# Patient Record
Sex: Female | Born: 1960
Health system: Southern US, Community
[De-identification: ages and names within clinical notes are randomized; demographics above are authoritative.]

## PROBLEM LIST (undated history)

## (undated) DIAGNOSIS — I10 Essential (primary) hypertension: Secondary | ICD-10-CM

## (undated) DIAGNOSIS — M14679 Charcot's joint, unspecified ankle and foot: Secondary | ICD-10-CM

## (undated) DIAGNOSIS — E11319 Type 2 diabetes mellitus with unspecified diabetic retinopathy without macular edema: Secondary | ICD-10-CM

## (undated) DIAGNOSIS — I639 Cerebral infarction, unspecified: Secondary | ICD-10-CM

## (undated) DIAGNOSIS — I2510 Atherosclerotic heart disease of native coronary artery without angina pectoris: Secondary | ICD-10-CM

## (undated) DIAGNOSIS — E1140 Type 2 diabetes mellitus with diabetic neuropathy, unspecified: Secondary | ICD-10-CM

## (undated) DIAGNOSIS — G4730 Sleep apnea, unspecified: Secondary | ICD-10-CM

---

## 2010-04-03 ENCOUNTER — Ambulatory Visit: Payer: Self-pay | Admitting: Internal Medicine

## 2015-02-07 ENCOUNTER — Other Ambulatory Visit: Payer: Self-pay | Admitting: Surgery

## 2015-02-07 ENCOUNTER — Ambulatory Visit
Admission: RE | Admit: 2015-02-07 | Discharge: 2015-02-07 | Disposition: A | Discharge status: Home / Self Care | Payer: BLUE CROSS/BLUE SHIELD | Source: Ambulatory Visit | Attending: Surgery | Admitting: Surgery

## 2015-02-07 ENCOUNTER — Encounter: Payer: BLUE CROSS/BLUE SHIELD | Attending: Surgery | Admitting: Surgery

## 2015-02-07 DIAGNOSIS — M19071 Primary osteoarthritis, right ankle and foot: Secondary | ICD-10-CM | POA: Insufficient documentation

## 2015-02-07 DIAGNOSIS — S81801A Unspecified open wound, right lower leg, initial encounter: Secondary | ICD-10-CM

## 2015-02-07 DIAGNOSIS — I10 Essential (primary) hypertension: Secondary | ICD-10-CM | POA: Insufficient documentation

## 2015-02-07 DIAGNOSIS — E11621 Type 2 diabetes mellitus with foot ulcer: Secondary | ICD-10-CM | POA: Diagnosis not present

## 2015-02-07 DIAGNOSIS — Z794 Long term (current) use of insulin: Secondary | ICD-10-CM | POA: Insufficient documentation

## 2015-02-07 DIAGNOSIS — L97412 Non-pressure chronic ulcer of right heel and midfoot with fat layer exposed: Secondary | ICD-10-CM | POA: Insufficient documentation

## 2015-02-07 DIAGNOSIS — M8580 Other specified disorders of bone density and structure, unspecified site: Secondary | ICD-10-CM | POA: Insufficient documentation

## 2015-02-07 DIAGNOSIS — X58XXXA Exposure to other specified factors, initial encounter: Secondary | ICD-10-CM | POA: Insufficient documentation

## 2015-02-07 DIAGNOSIS — M14671 Charcot's joint, right ankle and foot: Secondary | ICD-10-CM | POA: Insufficient documentation

## 2015-02-07 DIAGNOSIS — G629 Polyneuropathy, unspecified: Secondary | ICD-10-CM | POA: Insufficient documentation

## 2015-02-08 NOTE — Progress Notes (Signed)
Julia Simmons, Julia Simmons ([id number]) Visit Report for 02/07/2015 Allergy List Details Patient Name: Julia Simmons, Julia Simmons. Date of Service: 02/07/2015 10:15 AM Medical Record Number: [id number] Patient Account Number: [id number] Date of Birth/Sex: November 27, 1960 (54 y.o. Female) Treating RN: Montey Hora Primary Care Physician: Glendon Axe Other Clinician: Referring Physician: Treating Physician/Extender: Frann Rider in Treatment: 0 Allergies Active Allergies hydrochlorothiazide Sulfa (Sulfonamide Antibiotics) Allergy Notes Electronic Signature(s) Signed: 02/07/2015 5:27:36 PM By: Montey Hora Entered By: Montey Hora on 02/07/2015 10:19:22 Julia Simmons ([id number]) -------------------------------------------------------------------------------- Arrival Information Details Patient Name: Julia Simmons. Date of Service: 02/07/2015 10:15 AM Medical Record Number: [id number] Patient Account Number: [id number] Date of Birth/Sex: 1961-03-28 (54 y.o. Female) Treating RN: Montey Hora Primary Care Physician: Glendon Axe Other Clinician: Referring Physician: Treating Physician/Extender: Frann Rider in Treatment: 0 Visit Information Patient Arrived: Ambulatory Arrival Time: 10:18 Accompanied By: spouse Transfer Assistance: None Patient Identification Verified: Yes Secondary Verification Process Yes Completed: Patient Has Alerts: Yes Patient Alerts: DMII Electronic Signature(s) Signed: 02/07/2015 5:27:36 PM By: Montey Hora Entered By: Montey Hora on 02/07/2015 10:18:37 Julia Simmons ([id number]) -------------------------------------------------------------------------------- Clinic Level of Care Assessment Details Patient Name: Julia Simmons. Date of Service: 02/07/2015 10:15 AM Medical Record Number: [id number] Patient Account Number: [id number] Date of Birth/Sex: 12-23-1960 (54 y.o. Female) Treating RN: Montey Hora Primary Care Physician: Glendon Axe Other Clinician: Referring Physician: Treating Physician/Extender: Frann Rider in Treatment: 0 Clinic Level of Care Assessment Items TOOL 1 Quantity Score []  - Use when EandM and Procedure is performed on INITIAL visit 0 ASSESSMENTS - Nursing Assessment / Reassessment X - General Physical Exam (combine w/ comprehensive assessment (listed just 1 20 below) when performed on new pt. evals) X - Comprehensive Assessment (HX, ROS, Risk Assessments, Wounds Hx, etc.) 1 25 ASSESSMENTS - Wound and Skin Assessment / Reassessment []  - Dermatologic / Skin Assessment (not related to wound area) 0 ASSESSMENTS - Ostomy and/or Continence Assessment and Care []  - Incontinence Assessment and Management 0 []  - Ostomy Care Assessment and Management (repouching, etc.) 0 PROCESS - Coordination of Care X - Simple Patient / Family Education for ongoing care 1 15 []  - Complex (extensive) Patient / Family Education for ongoing care 0 X - Staff obtains Programmer, systems, Records, Test Results / Process Orders 1 10 []  - Staff telephones HHA, Nursing Homes / Clarify orders / etc 0 []  - Routine Transfer to another Facility (non-emergent condition) 0 []  - Routine Hospital Admission (non-emergent condition) 0 X - New Admissions / Biomedical engineer / Ordering NPWT, Apligraf, etc. 1 15 []  - Emergency Hospital Admission (emergent condition) 0 PROCESS - Special Needs []  - Pediatric / Minor Patient Management 0 []  - Isolation Patient Management 0 Julia Simmons, BREKKE. ([id number]) []  - Hearing / Language / Visual special needs 0 []  - Assessment of Community assistance (transportation, Simmons/C planning, etc.) 0 []  - Additional assistance / Altered mentation 0 []  - Support Surface(s) Assessment (bed, cushion, seat, etc.) 0 INTERVENTIONS - Miscellaneous []  - External ear exam 0 []  - Patient Transfer (multiple staff / Civil Service fast streamer / Similar devices) 0 []  - Simple Staple / Suture removal (25 or less) 0 []  - Complex  Staple / Suture removal (26 or more) 0 []  - Hypo/Hyperglycemic Management (do not check if billed separately) 0 X - Ankle / Brachial Index (ABI) - do not check if billed separately 1 15 Has the patient been seen at the hospital within the last three years: Yes Total Score: 100 Level Of Care: New/Established -  Level 3 Electronic Signature(s) Signed: 02/07/2015 5:27:36 PM By: Montey Hora Entered By: Montey Hora on 02/07/2015 11:03:30 Julia Simmons ([id number]) -------------------------------------------------------------------------------- Encounter Discharge Information Details Patient Name: Julia Simmons. Date of Service: 02/07/2015 10:15 AM Medical Record Number: [id number] Patient Account Number: [id number] Date of Birth/Sex: 12/31/1960 (54 y.o. Female) Treating RN: Montey Hora Primary Care Physician: Glendon Axe Other Clinician: Referring Physician: Treating Physician/Extender: Frann Rider in Treatment: 0 Encounter Discharge Information Items Discharge Pain Level: 0 Discharge Condition: Stable Ambulatory Status: Ambulatory Discharge Destination: Home Transportation: Private Auto Accompanied By: spouse Schedule Follow-up Appointment: Yes Medication Reconciliation completed and provided to Patient/Care No Jalana Moore: Provided on Clinical Summary of Care: 02/07/2015 Form Type Recipient Paper Patient [facility] Electronic Signature(s) Signed: 02/07/2015 11:28:32 AM By: Ruthine Dose Entered By: Ruthine Dose on 02/07/2015 11:28:32 Julia Simmons ([id number]) -------------------------------------------------------------------------------- Lower Extremity Assessment Details Patient Name: Julia Simmons. Date of Service: 02/07/2015 10:15 AM Medical Record Number: [id number] Patient Account Number: [id number] Date of Birth/Sex: 12/20/1960 (54 y.o. Female) Treating RN: Montey Hora Primary Care Physician: Glendon Axe Other Clinician: Referring  Physician: Treating Physician/Extender: Frann Rider in Treatment: 0 Edema Assessment Assessed: [Left: No] [Right: No] Edema: [Left: Yes] [Right: Yes] Calf Left: Right: Point of Measurement: 33 cm From Medial Instep 57.2 cm 45 cm Ankle Left: Right: Point of Measurement: 10 cm From Medial Instep 29.6 cm 25.2 cm Vascular Assessment Claudication: Claudication Assessment [Left:None] [Right:None] Pulses: Posterior Tibial Palpable: [Left:No] [Right:No] Dorsalis Pedis Palpable: [Left:Yes] [Right:Yes] Extremity colors, hair growth, and conditions: Extremity Color: [Left:Normal] [Right:Normal] Hair Growth on Extremity: [Left:No] [Right:No] Temperature of Extremity: [Left:Warm] [Right:Warm] Capillary Refill: [Left:< 3 seconds] [Right:< 3 seconds] Blood Pressure: Brachial: [Left:146] [Right:144] Dorsalis Pedis: [Left:Dorsalis Pedis: 172] Ankle: Posterior Tibial: 184 [Left:Posterior Tibial: 188 1.26] [Right:1.29] Toe Nail Assessment Left: Right: Thick: No No Discolored: No No Deformed: No No Improper Length and Hygiene: No No Julia Simmons, Julia Simmons ([id number]) Electronic Signature(s) Signed: 02/07/2015 5:27:36 PM By: Montey Hora Entered By: Montey Hora on 02/07/2015 10:55:00 Julia Simmons ([id number]) -------------------------------------------------------------------------------- Multi Wound Chart Details Patient Name: Julia Simmons. Date of Service: 02/07/2015 10:15 AM Medical Record Number: [id number] Patient Account Number: [id number] Date of Birth/Sex: 05-Feb-1961 (54 y.o. Female) Treating RN: Montey Hora Primary Care Physician: Glendon Axe Other Clinician: Referring Physician: Treating Physician/Extender: Frann Rider in Treatment: 0 Vital Signs Height(in): 65 Pulse(bpm): 70 Weight(lbs): 336 Blood Pressure 146/65 (mmHg): Body Mass Index(BMI): 56 Temperature(F): 97.9 Respiratory Rate 18 (breaths/min): Photos: [1:No Photos]  [N/A:N/A] Wound Location: [1:Right Foot - Plantar] [N/A:N/A] Wounding Event: [1:Gradually Appeared] [N/A:N/A] Primary Etiology: [1:Diabetic Wound/Ulcer of the Lower Extremity] [N/A:N/A] Comorbid History: [1:Hypertension, Type II Diabetes, Neuropathy] [N/A:N/A] Date Acquired: [1:11/17/2014] [N/A:N/A] Weeks of Treatment: [1:0] [N/A:N/A] Wound Status: [1:Open] [N/A:N/A] Measurements L x W x Simmons 1.4x1.8x0.7 [N/A:N/A] (cm) Area (cm) : [1:1.979] [N/A:N/A] Volume (cm) : [1:1.385] [N/A:N/A] % Reduction in Area: [1:0.00%] [N/A:N/A] % Reduction in Volume: 0.00% [N/A:N/A] Classification: [1:Grade 1] [N/A:N/A] Exudate Amount: [1:Medium] [N/A:N/A] Exudate Type: [1:Serous] [N/A:N/A] Exudate Color: [1:amber] [N/A:N/A] Wound Margin: [1:Flat and Intact] [N/A:N/A] Granulation Amount: [1:Large (67-100%)] [N/A:N/A] Granulation Quality: [1:Red] [N/A:N/A] Necrotic Amount: [1:None Present (0%)] [N/A:N/A] Exposed Structures: [1:Fascia: No Fat: No Tendon: No Muscle: No Joint: No Bone: No] [N/A:N/A] Limited to Skin Breakdown Epithelialization: None N/A N/A Periwound Skin Texture: Callus: Yes N/A N/A Edema: No Excoriation: No Induration: No Crepitus: No Fluctuance: No Friable: No Rash: No Scarring: No Periwound Skin Moist: Yes N/A N/A Moisture: Maceration: No Dry/Scaly: No Periwound Skin Color: Atrophie Blanche: No  N/A N/A Cyanosis: No Ecchymosis: No Erythema: No Hemosiderin Staining: No Mottled: No Pallor: No Rubor: No Temperature: No Abnormality N/A N/A Tenderness on No N/A N/A Palpation: Wound Preparation: Ulcer Cleansing: N/A N/A Rinsed/Irrigated with Saline Topical Anesthetic Applied: Other: lidocaine 4% Treatment Notes Electronic Signature(s) Signed: 02/07/2015 5:27:36 PM By: Montey Hora Entered By: Montey Hora on 02/07/2015 11:03:03 Julia Simmons ([id number]) -------------------------------------------------------------------------------- [place] Details Patient Name: Julia Simmons. Date of Service: 02/07/2015 10:15 AM Medical Record Number: [id number] Patient Account Number: [id number] Date of Birth/Sex: 31-Aug-1960 (54 y.o. Female) Treating RN: Montey Hora Primary Care Physician: Glendon Axe Other Clinician: Referring Physician: Treating Physician/Extender: Frann Rider in Treatment: 0 Active Inactive Abuse / Safety / Falls / Self Care Management Nursing Diagnoses: Impaired physical mobility Potential for falls Goals: Patient will remain injury free Date Initiated: 02/07/2015 Goal Status: Active Interventions: Assess fall risk on admission and as needed Notes: Orientation to the Wound Care Program Nursing Diagnoses: Knowledge deficit related to the wound healing center program Goals: Patient/caregiver will verbalize understanding of the [place] Program Date Initiated: 02/07/2015 Goal Status: Active Interventions: Provide education on orientation to the wound center Notes: Peripheral Neuropathy Nursing Diagnoses: Potential alteration in peripheral tissue perfusion (select prior to confirmation of diagnosis) Goals: Patient/caregiver will verbalize understanding of disease process and disease management Julia Simmons, Julia Simmons ([id number]) Date Initiated: 02/07/2015 Goal Status: Active Interventions: Assess signs and symptoms of neuropathy upon admission and as needed Notes: Wound/Skin Impairment Nursing Diagnoses: Impaired tissue integrity Goals: Ulcer/skin breakdown will have a volume reduction of 30% by week 4 Date Initiated: 02/07/2015 Goal Status: Active Ulcer/skin breakdown will have a volume reduction of 50% by week 8 Date Initiated: 02/07/2015 Goal Status: Active Ulcer/skin breakdown will have a volume reduction of 80% by week 12 Date Initiated: 02/07/2015 Goal Status: Active Ulcer/skin breakdown will heal within 14 weeks Date Initiated: 02/07/2015 Goal Status:  Active Interventions: Assess patient/caregiver ability to perform ulcer/skin care regimen upon admission and as needed Assess ulceration(s) every visit Notes: Electronic Signature(s) Signed: 02/07/2015 5:27:36 PM By: Montey Hora Entered By: Montey Hora on 02/07/2015 11:01:40 Julia Simmons ([id number]) -------------------------------------------------------------------------------- Patient/Caregiver Education Details Patient Name: Julia Simmons. Date of Service: 02/07/2015 10:15 AM Medical Record Number: [id number] Patient Account Number: [id number] Date of Birth/Gender: Jan 15, 1961 (54 y.o. Female) Treating RN: Montey Hora Primary Care Physician: Glendon Axe Other Clinician: Referring Physician: Treating Physician/Extender: Frann Rider in Treatment: 0 Education Assessment Education Provided To: Patient and Caregiver Education Topics Provided Wound/Skin Impairment: Handouts: Other: wound care as ordered Methods: Demonstration, Explain/Verbal Responses: State content correctly Electronic Signature(s) Signed: 02/07/2015 5:27:36 PM By: Montey Hora Entered By: Montey Hora on 02/07/2015 11:04:30 Julia Simmons ([id number]) -------------------------------------------------------------------------------- Wound Assessment Details Patient Name: Julia Simmons. Date of Service: 02/07/2015 10:15 AM Medical Record Number: [id number] Patient Account Number: [id number] Date of Birth/Sex: [birth year] (54 y.o. Female) Treating RN: Montey Hora Primary Care Physician: Glendon Axe Other Clinician: Referring Physician: Treating Physician/Extender: Frann Rider in Treatment: 0 Wound Status Wound Number: 1 Primary Diabetic Wound/Ulcer of the Lower Etiology: Extremity Wound Location: Right Foot - Plantar Wound Status: Open Wounding Event: Gradually Appeared Comorbid Hypertension, Type II Diabetes, Date Acquired: 10/17/2014 History: Neuropathy Weeks Of  Treatment: 0 Clustered Wound: No Photos Wound Measurements Length: (cm) 1.4 Width: (cm) 1.8 Depth: (cm) 0.7 Area: (cm) 1.979 Volume: (cm) 1.385 % Reduction in Area: 0% % Reduction in Volume: 0% Epithelialization: None Tunneling: No Undermining: No Wound Description Classification: Grade 1 Wound  Margin: Flat and Intact Exudate Amount: Medium Exudate Type: Serous Exudate Color: amber Foul Odor After Cleansing: No Wound Bed Granulation Amount: Large (67-100%) Exposed Structure Granulation Quality: Red Fascia Exposed: No Necrotic Amount: None Present (0%) Fat Layer Exposed: No Tendon Exposed: No Muscle Exposed: No Julia Simmons, Julia Simmons. ([id number]) Joint Exposed: No Bone Exposed: No Limited to Skin Breakdown Periwound Skin Texture Texture Color No Abnormalities Noted: No No Abnormalities Noted: No Callus: Yes Atrophie Blanche: No Crepitus: No Cyanosis: No Excoriation: No Ecchymosis: No Fluctuance: No Erythema: No Friable: No Hemosiderin Staining: No Induration: No Mottled: No Localized Edema: No Pallor: No Rash: No Rubor: No Scarring: No Temperature / Pain Moisture Temperature: No Abnormality No Abnormalities Noted: No Dry / Scaly: No Maceration: No Moist: Yes Wound Preparation Ulcer Cleansing: Rinsed/Irrigated with Saline Topical Anesthetic Applied: Other: lidocaine 4%, Treatment Notes Wound #1 (Right, Plantar Foot) 1. Cleansed with: Clean wound with Normal Saline 2. Anesthetic Topical Lidocaine 4% cream to wound bed prior to debridement 4. Dressing Applied: Aquacel Ag Other dressing (specify in notes) 5. Secondary Dressing Applied Dry Gauze 7. Secured with Tape Notes felt Electronic Signature(s) Signed: 02/07/2015 12:27:11 PM By: Montey Hora Entered By: Montey Hora on 02/07/2015 12:27:11 Julia Simmons, Julia Simmons ([id number]) Geanie Cooley, Ander Gaster  ([id number]) -------------------------------------------------------------------------------- Vitals Details Patient Name: Julia Simmons. Date of Service: 02/07/2015 10:15 AM Medical Record Number: [id number] Patient Account Number: [id number] Date of Birth/Sex: August 06, 1960 (54 y.o. Female) Treating RN: Montey Hora Primary Care Physician: Glendon Axe Other Clinician: Referring Physician: Treating Physician/Extender: Frann Rider in Treatment: 0 Vital Signs Time Taken: 10:26 Temperature (F): 97.9 Height (in): 65 Pulse (bpm): 70 Source: Stated Respiratory Rate (breaths/min): 18 Weight (lbs): 336 Blood Pressure (mmHg): 146/65 Source: Measured Reference Range: 80 - 120 mg / dl Body Mass Index (BMI): 55.9 Electronic Signature(s) Signed: 02/07/2015 5:27:36 PM By: Montey Hora Entered By: Montey Hora on 02/07/2015 10:29:42

## 2015-02-08 NOTE — Progress Notes (Addendum)
DAMIEN, CISAR ([id number]) Visit Report for 02/07/2015 Chief Complaint Document Details Patient Name: Julia Simmons, Julia Simmons. Date of Service: 02/07/2015 10:15 AM Medical Record Number: [id number] Patient Account Number: [id number] Date of Birth/Sex: 02/10/1961 (54 y.o. Female) Treating RN: Montey Hora Primary Care Physician: Glendon Axe Other Clinician: Referring Physician: Treating Physician/Extender: Frann Rider in Treatment: 0 Information Obtained from: Patient Chief Complaint Patients presents for treatment of an open diabetic ulcer. 54 year old patient whose had a ulcerated area on her right midfoot for about 3 months. Electronic Signature(s) Signed: 02/07/2015 11:40:33 AM By: Christin Fudge MD, FACS Entered By: Christin Fudge on 02/07/2015 11:40:33 Vernon Prey ([id number]) -------------------------------------------------------------------------------- Debridement Details Patient Name: Julia Shown D. Date of Service: 02/07/2015 10:15 AM Medical Record Number: [id number] Patient Account Number: [id number] Date of Birth/Sex: [birth year] (54 y.o. Female) Treating RN: Montey Hora Primary Care Physician: Glendon Axe Other Clinician: Referring Physician: Treating Physician/Extender: Frann Rider in Treatment: 0 Debridement Performed for Wound #1 Right,Plantar Foot Assessment: Performed By: Physician Pat Patrick., MD Debridement: Debridement Pre-procedure Yes Verification/Time Out Taken: Start Time: 11:07 Pain Control: Lidocaine 4% Topical Solution Level: Skin/Subcutaneous Tissue Total Area Debrided (L x 1.4 (cm) x 1.8 (cm) = 2.52 (cm) W): Tissue and other Viable, Non-Viable, Callus, Fibrin/Slough, Subcutaneous material debrided: Instrument: Curette Bleeding: Minimum Hemostasis Achieved: Pressure End Time: 11:10 Procedural Pain: 0 Post Procedural Pain: 0 Response to Treatment: Procedure was tolerated well Post Debridement Measurements of  Total Wound Length: (cm) 1.4 Width: (cm) 1.8 Depth: (cm) 0.7 Volume: (cm) 1.385 Post Procedure Diagnosis Same as Pre-procedure Electronic Signature(s) Signed: 02/07/2015 11:39:57 AM By: Christin Fudge MD, FACS Signed: 02/07/2015 5:27:36 PM By: Montey Hora Entered By: Christin Fudge on 02/07/2015 11:39:56 Vernon Prey ([id number]) -------------------------------------------------------------------------------- HPI Details Patient Name: Julia Shown D. Date of Service: 02/07/2015 10:15 AM Medical Record Number: [id number] Patient Account Number: [id number] Date of Birth/Sex: 02-11-1961 (54 y.o. Female) Treating RN: Montey Hora Primary Care Physician: Glendon Axe Other Clinician: Referring Physician: Treating Physician/Extender: Frann Rider in Treatment: 0 History of Present Illness Location: ulcerated area right midfoot Quality: Patient reports No Pain. Severity: Patient states wound are getting worse. Duration: Patient has had the wound for > 3 months prior to seeking treatment at the wound center Context: The wound appeared gradually over time Modifying Factors: Other treatment(s) tried include: She sees Dr. Cleda Mccreedy for her podiatry, she sees a orthopedic doctor at [facility] and he had prescribed a Crow walking boot. Associated Signs and Symptoms: Patient reports having difficulty standing for long periods. HPI Description: 54 year old patient who comes regarding her right lower extremity with an open wound and history of Charcot's arthropathy for which she sees a podiatrist regularly and has been wearing a Crow walker. She has been seeing Dr. Cleda Mccreedy in the podiatry office.She was recently seen at the [place] office and given a gram of vancomycin on 01/28/2015. Her PCP is Dr. Ruffin Frederick who saw her in July and noted that the patient has been noncompliant with her diabetic care and her hemoglobin A1c improved from 10.2 in May to 8.1 on recent labs sheet.  the patient's past medical history significant for diabetes mellitus type 2, hypertension, hyperlipidemia, obesity, Charcot foot due to diabetes mellitus and is status post cholecystectomy and cesarean section.during her last visit the Levemir medication dose was increased and she was also on NovoLog, Actos, Glucophage Electronic Signature(s) Signed: 02/07/2015 11:42:10 AM By: Christin Fudge MD, FACS Previous Signature: 02/07/2015 10:29:39 AM Version By: Christin Fudge MD, FACS Previous  Signature: 02/07/2015 10:27:04 AM Version By: Christin Fudge MD, FACS Entered By: Christin Fudge on 02/07/2015 11:42:09 Vernon Prey ([id number]) -------------------------------------------------------------------------------- Physical Exam Details Patient Name: Julia Shown D. Date of Service: 02/07/2015 10:15 AM Medical Record Number: [id number] Patient Account Number: [id number] Date of Birth/Sex: 09/25/1960 (54 y.o. Female) Treating RN: Montey Hora Primary Care Physician: Glendon Axe Other Clinician: Referring Physician: Treating Physician/Extender: Frann Rider in Treatment: 0 Constitutional . Pulse regular. Respirations normal and unlabored. Afebrile. . Eyes Nonicteric. Reactive to light. Ears, Nose, Mouth, and Throat Lips, teeth, and gums WNL.Marland Kitchen Moist mucosa without lesions . Neck supple and nontender. No palpable supraclavicular or cervical adenopathy. Normal sized without goiter. Respiratory WNL. No retractions.. Cardiovascular Pedal Pulses WNL. ABI on the left was 1.26 and on the right was 1.29. No clubbing, cyanosis or edema. Gastrointestinal (GI) Abdomen without masses or tenderness.. No liver or spleen enlargement or tenderness.. Lymphatic No adneopathy. No adenopathy. No adenopathy. Musculoskeletal Adexa without tenderness or enlargement.. Digits and nails w/o clubbing, cyanosis, infection, petechiae, ischemia, or inflammatory conditions.. Integumentary (Hair, Skin) No  suspicious lesions. No crepitus or fluctuance. No peri-wound warmth or erythema. No masses.Marland Kitchen Psychiatric Judgement and insight Intact.. No evidence of depression, anxiety, or agitation.. Notes She has a fairly large ulcer in the right midfoot area which has a callus surrounding it and there is some eschar over the actual ulcerated area. This will need to debridement with a curette Electronic Signature(s) Signed: 02/07/2015 11:42:58 AM By: Christin Fudge MD, FACS Entered By: Christin Fudge on 02/07/2015 11:42:58 Vernon Prey ([id number]) -------------------------------------------------------------------------------- Physician Orders Details Patient Name: Julia Shown D. Date of Service: 02/07/2015 10:15 AM Medical Record Number: [id number] Patient Account Number: [id number] Date of Birth/Sex: 01-Feb-1961 (53 y.o. Female) Treating RN: Montey Hora Primary Care Physician: Glendon Axe Other Clinician: Referring Physician: Treating Physician/Extender: Frann Rider in Treatment: 0 Verbal / Phone Orders: Yes Clinician: Montey Hora Read Back and Verified: Yes Diagnosis Coding Wound Cleansing Wound #1 Right,Plantar Foot o Clean wound with Normal Saline. Anesthetic Wound #1 Right,Plantar Foot o Topical Lidocaine 4% cream applied to wound bed prior to debridement Primary Wound Dressing Wound #1 Right,Plantar Foot o Aquacel Ag o Other: - felt surrounding wound Secondary Dressing Wound #1 Right,Plantar Foot o Dry Gauze Dressing Change Frequency Wound #1 Right,Plantar Foot o Change dressing every other day. Follow-up Appointments Wound #1 Right,Plantar Foot o Return Appointment in 1 week. Off-Loading Wound #1 Right,Plantar Foot o Other: - crow boot Additional Orders / Instructions Wound #1 Right,Plantar Foot o Other: - work on controlling blood sugars, work on losing weight, go back and see Dr Cleda Mccreedy, go to [employer] for [employer] DESREE, LEAP ([id number]) Radiology o X-ray, foot - right foot oooo Electronic Signature(s) Signed: 02/07/2015 4:35:29 PM By: Christin Fudge MD, FACS Signed: 02/07/2015 5:27:36 PM By: Montey Hora Entered By: Montey Hora on 02/07/2015 11:14:44 Vernon Prey ([id number]) -------------------------------------------------------------------------------- Problem List Details Patient Name: Julia Shown D. Date of Service: 02/07/2015 10:15 AM Medical Record Number: [id number] Patient Account Number: [id number] Date of Birth/Sex: 07/21/1960 (54 y.o. Female) Treating RN: Montey Hora Primary Care Physician: Glendon Axe Other Clinician: Referring Physician: Treating Physician/Extender: Frann Rider in Treatment: 0 Active Problems ICD-10 Encounter Code Description Active Date Diagnosis E11.621 Type 2 diabetes mellitus with foot ulcer 02/07/2015 Yes 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t with 02/07/2015 Yes fat layer exposed M14.671 Charcot's joint, right ankle and foot 02/07/2015 Yes E66.01 Morbid (severe) obesity due to excess  calories 02/07/2015 Yes Inactive Problems Resolved Problems Electronic Signature(s) Signed: 02/07/2015 11:39:44 AM By: Christin Fudge MD, FACS Previous Signature: 02/07/2015 11:39:34 AM Version By: Christin Fudge MD, FACS Entered By: Christin Fudge on 02/07/2015 11:39:44 Vernon Prey ([id number]) -------------------------------------------------------------------------------- Progress Note Details Patient Name: Julia Shown D. Date of Service: 02/07/2015 10:15 AM Medical Record Number: [id number] Patient Account Number: [id number] Date of Birth/Sex: 1960-07-10 (54 y.o. Female) Treating RN: Montey Hora Primary Care Physician: Glendon Axe Other Clinician: Referring Physician: Treating Physician/Extender: Frann Rider in Treatment: 0 Subjective Chief Complaint Information obtained from  Patient Patients presents for treatment of an open diabetic ulcer. 54 year old patient whose had a ulcerated area on her right midfoot for about 3 months. History of Present Illness (HPI) The following HPI elements were documented for the patient's wound: Location: ulcerated area right midfoot Quality: Patient reports No Pain. Severity: Patient states wound are getting worse. Duration: Patient has had the wound for > 3 months prior to seeking treatment at the wound center Context: The wound appeared gradually over time Modifying Factors: Other treatment(s) tried include: She sees Dr. Cleda Mccreedy for her podiatry, she sees a orthopedic doctor at [facility] and he had prescribed a Crow walking boot. Associated Signs and Symptoms: Patient reports having difficulty standing for long periods. 54 year old patient who comes regarding her right lower extremity with an open wound and history of Charcot's arthropathy for which she sees a podiatrist regularly and has been wearing a Crow walker. She has been seeing Dr. Cleda Mccreedy in the podiatry office.She was recently seen at the [place] office and given a gram of vancomycin on 01/28/2015. Her PCP is Dr. Ruffin Frederick who saw her in July and noted that the patient has been noncompliant with her diabetic care and her hemoglobin A1c improved from 10.2 in May to 8.1 on recent labs sheet. the patient's past medical history significant for diabetes mellitus type 2, hypertension, hyperlipidemia, obesity, Charcot foot due to diabetes mellitus and is status post cholecystectomy and cesarean section.during her last visit the Levemir medication dose was increased and she was also on NovoLog, Actos, Glucophage Wound History Patient presents with 1 open wound that has been present for approximately over 1 year. Patient has been treating wound in the following manner: betadine. Laboratory tests have been performed in the last month. Patient reportedly has not tested  positive for an antibiotic resistant organism. Patient reportedly has not tested positive for osteomyelitis. Patient reportedly has not had testing performed to evaluate circulation in the legs. Patient experiences the following problems associated with their wounds: infection, swelling. Patient History Information obtained from Patient. Allergies AVIKA, CARBINE ([id number]) hydrochlorothiazide, Sulfa (Sulfonamide Antibiotics) Family History Cancer - Mother, Father, Diabetes - Mother, Heart Disease - Mother, Father, Hypertension - Mother, Father, Lung Disease - Mother, Father, Stroke - Mother, No family history of Hereditary Spherocytosis, Kidney Disease, Seizures, Thyroid Problems, Tuberculosis. Social History Never smoker, Marital Status - Married, Alcohol Use - Never, Drug Use - No History, Caffeine Use - Never. Medical History Cardiovascular Patient has history of Hypertension Endocrine Patient has history of Type II Diabetes Neurologic Patient has history of Neuropathy Oncologic Denies history of Received Chemotherapy, Received Radiation Psychiatric Denies history of Anorexia/bulimia, Confinement Anxiety Patient is treated with Insulin, Oral Agents. Blood sugar is tested. Review of Systems (ROS) Constitutional Symptoms (General Health) The patient has no complaints or symptoms. Eyes The patient has no complaints or symptoms. Ear/Nose/Mouth/Throat The patient has no complaints or symptoms. Hematologic/Lymphatic The patient has no complaints  or symptoms. Respiratory The patient has no complaints or symptoms. Cardiovascular Complains or has symptoms of LE edema. Gastrointestinal The patient has no complaints or symptoms. Genitourinary The patient has no complaints or symptoms. Immunological The patient has no complaints or symptoms. Integumentary (Skin) The patient has no complaints or symptoms. Musculoskeletal The patient has no complaints or  symptoms. Neurologic The patient has no complaints or symptoms. Oncologic SATORIA, DUNLOP ([id number]) The patient has no complaints or symptoms. Psychiatric The patient has no complaints or symptoms. Medications amlodipine 10 mg-benazepril 40 mg capsule oral 1 1 capsule oral aspirin 81 mg tablet,delayed release oral tablet,delayed release (DR/EC) oral metformin 500 mg tablet oral tablet oral pioglitazone 45 mg tablet oral 1 1 tablet oral pravastatin 40 mg tablet oral 1 1 tablet oral Levemir FlexTouch 100 unit/mL (3 mL) subcutaneous insulin pen subcutaneous insulin pen subcutaneous Novolog Flexpen 100 unit/mL subcutaneous subcutaneous insulin pen subcutaneous furosemide 40 mg tablet oral 1 1 tablet oral potassium chloride ER 10 mEq tablet,extended release oral 1 1 tablet extended release oral Objective Constitutional Pulse regular. Respirations normal and unlabored. Afebrile. Vitals Time Taken: 10:26 AM, Height: 65 in, Source: Stated, Weight: 336 lbs, Source: Measured, BMI: 55.9, Temperature: 97.9 F, Pulse: 70 bpm, Respiratory Rate: 18 breaths/min, Blood Pressure: 146/65 mmHg. Eyes Nonicteric. Reactive to light. Ears, Nose, Mouth, and Throat Lips, teeth, and gums WNL.Marland Kitchen Moist mucosa without lesions . Neck supple and nontender. No palpable supraclavicular or cervical adenopathy. Normal sized without goiter. Respiratory WNL. No retractions.. Cardiovascular Pedal Pulses WNL. ABI on the left was 1.26 and on the right was 1.29. No clubbing, cyanosis or edema. Gastrointestinal (GI) Abdomen without masses or tenderness.. No liver or spleen enlargement or tenderness.Marland Kitchen KALLIOPE, RIESEN ([id number]) Lymphatic No adneopathy. No adenopathy. No adenopathy. Musculoskeletal Adexa without tenderness or enlargement.. Digits and nails w/o clubbing, cyanosis, infection, petechiae, ischemia, or inflammatory conditions.Marland Kitchen Psychiatric Judgement and insight Intact.. No evidence of depression,  anxiety, or agitation.. General Notes: She has a fairly large ulcer in the right midfoot area which has a callus surrounding it and there is some eschar over the actual ulcerated area. This will need to debridement with a curette Integumentary (Hair, Skin) No suspicious lesions. No crepitus or fluctuance. No peri-wound warmth or erythema. No masses.. Wound #1 status is Open. Original cause of wound was Gradually Appeared. The wound is located on the [place]. The wound measures 1.4cm length x 1.8cm width x 0.7cm depth; 1.979cm^2 area and 1.385cm^3 volume. The wound is limited to skin breakdown. There is no tunneling or undermining noted. There is a medium amount of serous drainage noted. The wound margin is flat and intact. There is large (67-100%) red granulation within the wound bed. There is no necrotic tissue within the wound bed. The periwound skin appearance exhibited: Callus, Moist. The periwound skin appearance did not exhibit: Crepitus, Excoriation, Fluctuance, Friable, Induration, Localized Edema, Rash, Scarring, Dry/Scaly, Maceration, Atrophie Blanche, Cyanosis, Ecchymosis, Hemosiderin Staining, Mottled, Pallor, Rubor, Erythema. Periwound temperature was noted as No Abnormality. Assessment Active Problems ICD-10 E11.621 - Type 2 diabetes mellitus with foot ulcer L97.412 - Non-pressure chronic ulcer of right heel and midfoot with fat layer exposed M14.671 - Charcot's joint, right ankle and foot E66.01 - Morbid (severe) obesity due to excess calories The patient has seen an orthopedic specialist at [facility] but does not want to go back. I have recommended she sees Dr. Cleda Mccreedy for her podiatry needs and get repeat x-rays of her foot which have not been done  for over a year. SHANEEN, REESER ([id number]) I have discussed weight loss, good control of her diabetes mellitus and also discussed offloading in great detail. He is encouraged to work with her PCP to control her  hemoglobin A1c. I have recommended silver alginate over the wound and a proper offloading felt to be applied so as to help with her crow walking boot. She will see me back next week. Procedures Wound #1 Wound #1 is a Diabetic Wound/Ulcer of the Lower Extremity located on the [place] . There was a Skin/Subcutaneous Tissue Debridement ([id number]) debridement with total area of 2.52 sq cm performed by Pat Patrick., MD. with the following instrument(s): Curette to remove Viable and Non-Viable tissue/material including Fibrin/Slough, Callus, and Subcutaneous after achieving pain control using Lidocaine 4% Topical Solution. A time out was conducted prior to the start of the procedure. A Minimum amount of bleeding was controlled with Pressure. The procedure was tolerated well with a pain level of 0 throughout and a pain level of 0 following the procedure. Post Debridement Measurements: 1.4cm length x 1.8cm width x 0.7cm depth; 1.385cm^3 volume. Post procedure Diagnosis Wound #1: Same as Pre-Procedure Plan Wound Cleansing: Wound #1 Right,Plantar Foot: Clean wound with Normal Saline. Anesthetic: Wound #1 Right,Plantar Foot: Topical Lidocaine 4% cream applied to wound bed prior to debridement Primary Wound Dressing: Wound #1 Right,Plantar Foot: Aquacel Ag Other: - felt surrounding wound Secondary Dressing: Wound #1 Right,Plantar Foot: Dry Gauze Dressing Change Frequency: Wound #1 Right,Plantar Foot: Change dressing every other day. Follow-up Appointments: Wound #1 Right,Plantar Foot: Return Appointment in 1 week. Off-Loading: Wound #1 Right,Plantar Foot: ORELIA, BRANDSTETTER. ([id number]) Other: - crow boot Additional Orders / Instructions: Wound #1 Right,Plantar Foot: Other: - work on controlling blood sugars, work on losing weight, go back and see Dr Cleda Mccreedy, go to [employer] for [employer] adjustment Radiology ordered were: X-ray, foot - right foot The patient has seen an  orthopedic specialist at [facility] but does not want to go back. I have recommended she sees Dr. Cleda Mccreedy for her podiatry needs and get repeat x-rays of her foot which have not been done for over a year. I have discussed weight loss, good control of her diabetes mellitus and also discussed offloading in great detail. He is encouraged to work with her PCP to control her hemoglobin A1c. I have recommended silver alginate over the wound and a proper offloading felt to be applied so as to help with her crow walking boot. She will see me back next week. Electronic Signature(s) Signed: 02/08/2015 4:02:51 PM By: Christin Fudge MD, FACS Previous Signature: 02/07/2015 11:44:49 AM Version By: Christin Fudge MD, FACS Entered By: Christin Fudge on 02/08/2015 16:02:50 Vernon Prey ([id number]) -------------------------------------------------------------------------------- ROS/PFSH Details Patient Name: Julia Shown D. Date of Service: 02/07/2015 10:15 AM Medical Record Number: [id number] Patient Account Number: [id number] Date of Birth/Sex: 1961-01-14 (54 y.o. Female) Treating RN: Montey Hora Primary Care Physician: Glendon Axe Other Clinician: Referring Physician: Treating Physician/Extender: Frann Rider in Treatment: 0 Information Obtained From Patient Wound History Do you currently have one or more open woundso Yes How many open wounds do you currently haveo 1 Approximately how long have you had your woundso over 1 year How have you been treating your wound(s) until nowo betadine Has your wound(s) ever healed and then re-openedo No Have you had any lab work done in the past montho Yes Who ordered the lab work doneo Dr Candiss Norse Have you tested positive for an antibiotic  resistant organism (MRSA, VRE)o No Have you tested positive for osteomyelitis (bone infection)o No Have you had any tests for circulation on your legso No Have you had other problems associated with your woundso  Infection, Swelling Cardiovascular Complaints and Symptoms: Positive for: LE edema Medical History: Positive for: Hypertension Psychiatric Complaints and Symptoms: No Complaints or Symptoms Complaints and Symptoms: Negative for: Anxiety; Claustrophobia Medical History: Negative for: Anorexia/bulimia; Confinement Anxiety Constitutional Symptoms (General Health) Complaints and Symptoms: No Complaints or Symptoms Eyes Sneed, Thetis D. ([id number]) Complaints and Symptoms: No Complaints or Symptoms Ear/Nose/Mouth/Throat Complaints and Symptoms: No Complaints or Symptoms Hematologic/Lymphatic Complaints and Symptoms: No Complaints or Symptoms Respiratory Complaints and Symptoms: No Complaints or Symptoms Gastrointestinal Complaints and Symptoms: No Complaints or Symptoms Endocrine Medical History: Positive for: Type II Diabetes Time with diabetes: about 35 years Treated with: Insulin, Oral agents Blood sugar tested every day: Yes Tested : BID - about 145 Genitourinary Complaints and Symptoms: No Complaints or Symptoms Immunological Complaints and Symptoms: No Complaints or Symptoms Integumentary (Skin) Complaints and Symptoms: No Complaints or Symptoms Musculoskeletal Complaints and Symptoms: No Complaints or Symptoms Klingerman, Grey D. ([id number]) Neurologic Complaints and Symptoms: No Complaints or Symptoms Medical History: Positive for: Neuropathy Oncologic Complaints and Symptoms: No Complaints or Symptoms Medical History: Negative for: Received Chemotherapy; Received Radiation Immunizations Immunization Notes: up to date but unknown date Family and Social History Cancer: Yes - Mother, Father; Diabetes: Yes - Mother; Heart Disease: Yes - Mother, Father; Hereditary Spherocytosis: No; Hypertension: Yes - Mother, Father; Kidney Disease: No; Lung Disease: Yes - Mother, Father; Seizures: No; Stroke: Yes - Mother; Thyroid Problems: No; Tuberculosis: No;  Never smoker; Marital Status - Married; Alcohol Use: Never; Drug Use: No History; Caffeine Use: Never; Financial Concerns: No; Food, Clothing or Shelter Needs: No; Support System Lacking: No; Transportation Concerns: No; Advanced Directives: No; Patient does not want information on Advanced Directives Physician Affirmation I have reviewed and agree with the above information. Electronic Signature(s) Signed: 02/07/2015 10:57:06 AM By: Christin Fudge MD, FACS Signed: 02/07/2015 5:27:36 PM By: Montey Hora Entered By: Christin Fudge on 02/07/2015 10:57:06 Vernon Prey ([id number]) -------------------------------------------------------------------------------- SuperBill Details Patient Name: Julia Shown D. Date of Service: 02/07/2015 Medical Record Number: [id number] Patient Account Number: [id number] Date of Birth/Sex: 01-11-1961 (54 y.o. 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 with fat layer exposed M14.671 Charcot's joint, right ankle and foot E66.01 Morbid (severe) obesity due to excess calories Facility Procedures CPT4 Code Description: 24401027 99213 - WOUND CARE VISIT-LEV 3 EST PT Modifier: Quantity: 1 CPT4 Code Description: 25366440 11042 - DEB SUBQ TISSUE 20 SQ CM/< ICD-10 Description Diagnosis E11.621 Type 2 diabetes mellitus with foot ulcer 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 M14.671 Charcot's joint, right ankle and foot  E66.01 Morbid (severe) obesity due to excess calories Modifier: t with fat la Quantity: 1 yer exposed Physician Procedures CPT4 Code Description: 3474259 56387 - WC PHYS LEVEL 4 - NEW PT ICD-10 Description Diagnosis E11.621 Type 2  diabetes mellitus with foot ulcer 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 ([id number]) Modifier: t with fat lay Quantity: 1 er exposed Electronic Signature(s) Signed: 02/07/2015 11:45:12 AM By: Christin Fudge MD, FACS Entered By: Christin Fudge on 02/07/2015 11:45:11

## 2015-02-08 NOTE — Progress Notes (Signed)
[name], [name] ([id number]) Visit Report for 02/07/2015 Abuse/Suicide Risk Screen Details Patient Name: Julia Simmons, Julia Simmons. Date of Service: 02/07/2015 10:15 AM Medical Record Number: [id number] Patient Account Number: [id number] Date of Birth/Sex: [birth year] (54 y.o. Female) Treating RN: Montey Hora Primary Care Physician: Glendon Axe Other Clinician: Referring Physician: Treating Physician/Extender: Frann Rider in Treatment: 0 Abuse/Suicide Risk Screen Items Answer ABUSE/SUICIDE RISK SCREEN: Has anyone close to you tried to hurt or harm you recentlyo No Do you feel uncomfortable with anyone in your familyo No Has anyone forced you do things that you didnot want to doo No Do you have any thoughts of harming yourselfo No Patient displays signs or symptoms of abuse and/or neglect. No Electronic Signature(s) Signed: 02/07/2015 5:27:36 PM By: Montey Hora Entered By: Montey Hora on 02/07/2015 10:25:31 Julia Simmons ([id number]) -------------------------------------------------------------------------------- Activities of Daily Living Details Patient Name: [name] Date of Service: 02/07/2015 10:15 AM Medical Record Number: [id number] Patient Account Number: [id number] Date of Birth/Sex: 1961-01-04 (54 y.o. Female) Treating RN: Montey Hora Primary Care Physician: Glendon Axe Other Clinician: Referring Physician: Treating Physician/Extender: Frann Rider in Treatment: 0 Activities of Daily Living Items Answer Activities of Daily Living (Please select one for each item) Drive Automobile Completely Able Take Medications Completely Able Use Telephone Completely Able Care for Appearance Completely Able Use Toilet Completely Able Bath / Shower Completely Able Dress Self Completely Able Feed Self Completely Able Walk Completely Able Get In / Out Bed Completely Able Housework Completely Able Prepare Meals Completely Able Handle Money  Completely Able Shop for Self Completely Able Electronic Signature(s) Signed: 02/07/2015 5:27:36 PM By: Montey Hora Entered By: Montey Hora on 02/07/2015 10:25:58 Julia Simmons ([id number]) -------------------------------------------------------------------------------- Education Assessment Details Patient Name: [name] Date of Service: 02/07/2015 10:15 AM Medical Record Number: [id number] Patient Account Number: [id number] Date of Birth/Sex: 1961-05-17 (54 y.o. Female) Treating RN: Montey Hora Primary Care Physician: Glendon Axe Other Clinician: Referring Physician: Treating Physician/Extender: Frann Rider in Treatment: 0 Primary Learner Assessed: Patient Learning Preferences/Education Level/Primary Language Learning Preference: Explanation, Demonstration, Printed Material Highest Education Level: College or Above Preferred Language: English Cognitive Barrier Assessment/Beliefs Language Barrier: No Translator Needed: No Memory Deficit: No Emotional Barrier: No Cultural/Religious Beliefs Affecting Medical No Care: Physical Barrier Assessment Impaired Vision: No Impaired Hearing: No Decreased Hand dexterity: No Knowledge/Comprehension Assessment Knowledge Level: Medium Comprehension Level: Medium Ability to understand written Medium instructions: Ability to understand verbal Medium instructions: Motivation Assessment Anxiety Level: Calm Cooperation: Cooperative Education Importance: Acknowledges Need Interest in Health Problems: Asks Questions Perception: Coherent Willingness to Engage in Self- Medium Management Activities: Readiness to Engage in Self- Medium Management Activities: Electronic Signature(s) LASHINA, MILLES ([id number]) Signed: 02/07/2015 5:27:36 PM By: Montey Hora Entered By: Montey Hora on 02/07/2015 10:26:28 Julia Simmons  ([id number]) -------------------------------------------------------------------------------- Fall Risk Assessment Details Patient Name: [name] Date of Service: 02/07/2015 10:15 AM Medical Record Number: [id number] Patient Account Number: [id number] Date of Birth/Sex: 1961-01-09 (54 y.o. Female) Treating RN: Montey Hora Primary Care Physician: Glendon Axe Other Clinician: Referring Physician: Treating Physician/Extender: Frann Rider in Treatment: 0 Fall Risk Assessment Items FALL RISK ASSESSMENT: History of falling - immediate or within 3 months 25 Yes Secondary diagnosis 0 No Ambulatory aid None/bed rest/wheelchair/nurse 0 Yes Crutches/cane/walker 0 No Furniture 0 No IV Access/Saline Lock 0 No Gait/Training Normal/bed rest/immobile 0 Yes Weak 0 No Impaired 0 No Mental Status Oriented to own ability 0 Yes Electronic Signature(s) Signed: 02/07/2015 5:27:36 PM By: Montey Hora Entered  By: Montey Hora on 02/07/2015 10:26:42 Julia Simmons ([id number]) -------------------------------------------------------------------------------- Nutrition Risk Assessment Details Patient Name: [name], [name]. Date of Service: 02/07/2015 10:15 AM Medical Record Number: [id number] Patient Account Number: [id number] Date of Birth/Sex: 10/31/1960 (54 y.o. Female) Treating RN: Montey Hora Primary Care Physician: Glendon Axe Other Clinician: Referring Physician: Treating Physician/Extender: Frann Rider in Treatment: 0 Height (in): Weight (lbs): Body Mass Index (BMI): Nutrition Risk Assessment Items NUTRITION RISK SCREEN: I have an illness or condition that made me change the kind and/or 0 No amount of food I eat I eat fewer than two meals per day 0 No I eat few fruits and vegetables, or milk products 0 No I have three or more drinks of beer, liquor or wine almost every day 0 No I have tooth or mouth problems that make it hard for me to eat 0 No I  don't always have enough money to buy the food I need 0 No I eat alone most of the time 0 No I take three or more different prescribed or over-the-counter drugs a 1 Yes day Without wanting to, I have lost or gained 10 pounds in the last six 0 No months I am not always physically able to shop, cook and/or feed myself 0 No Nutrition Protocols Good Risk Protocol 0 No interventions needed Moderate Risk Protocol Electronic Signature(s) Signed: 02/07/2015 5:27:36 PM By: Montey Hora Entered By: Montey Hora on 02/07/2015 10:26:49

## 2015-02-14 ENCOUNTER — Encounter (HOSPITAL_BASED_OUTPATIENT_CLINIC_OR_DEPARTMENT_OTHER): Payer: BLUE CROSS/BLUE SHIELD | Admitting: General Surgery

## 2015-02-14 ENCOUNTER — Encounter: Payer: Self-pay | Admitting: General Surgery

## 2015-02-14 DIAGNOSIS — E10621 Type 1 diabetes mellitus with foot ulcer: Secondary | ICD-10-CM

## 2015-02-14 DIAGNOSIS — L97509 Non-pressure chronic ulcer of other part of unspecified foot with unspecified severity: Secondary | ICD-10-CM

## 2015-02-14 DIAGNOSIS — L97412 Non-pressure chronic ulcer of right heel and midfoot with fat layer exposed: Secondary | ICD-10-CM | POA: Diagnosis not present

## 2015-02-14 NOTE — Progress Notes (Signed)
See i heal 

## 2015-02-15 NOTE — Progress Notes (Signed)
Julia Simmons, Julia Simmons ([id number]) Visit Report for 02/14/2015 Arrival Information Details Patient Name: Julia Simmons, Julia Simmons. Date of Service: 02/14/2015 9:30 AM Medical Record Number: [id number] Patient Account Number: [id number] Date of Birth/Sex: Jun 07, 1961 (54 y.o. Female) Treating RN: Cornell Barman Primary Care Physician: Glendon Axe Other Clinician: Referring Physician: Glendon Axe Treating Physician/Extender: Benjaman Pott in Treatment: 1 Visit Information History Since Last Visit Added or deleted any medications: No Patient Arrived: Ambulatory Any new allergies or adverse reactions: No Arrival Time: 09:16 Had a fall or experienced change in No Accompanied By: self activities of daily living that may affect Transfer Assistance: None risk of falls: Patient Identification Verified: Yes Signs or symptoms of abuse/neglect since No Secondary Verification Process Yes last visito Completed: Hospitalized since last visit: No Patient Has Alerts: Yes Has Dressing in Place as Prescribed: Yes Patient Alerts: DMII Has Footwear/Offloading in Place as Yes Prescribed: Right: Other:charcot boot Pain Present Now: No Electronic Signature(s) Signed: 02/14/2015 9:55:42 AM By: Judene Companion MD Entered By: Judene Companion on 02/14/2015 09:55:42 Julia Simmons ([id number]) -------------------------------------------------------------------------------- Encounter Discharge Information Details Patient Name: Julia Shown D. Date of Service: 02/14/2015 9:30 AM Medical Record Number: [id number] Patient Account Number: [id number] Date of Birth/Sex: 07/18/1960 (54 y.o. Female) Treating RN: Cornell Barman Primary Care Physician: Glendon Axe Other Clinician: Referring Physician: Glendon Axe Treating Physician/Extender: Benjaman Pott in Treatment: 1 Encounter Discharge Information Items Discharge Pain Level: 0 Discharge Condition: Stable Ambulatory Status: Ambulatory Discharge  Destination: Home Transportation: Private Auto Accompanied By: self Schedule Follow-up Appointment: Yes Medication Reconciliation completed and provided to Patient/Care Yes Ahsley Attwood: Provided on Clinical Summary of Care: 02/14/2015 Form Type Recipient Paper Patient [facility] Electronic Signature(s) Signed: 02/14/2015 10:04:29 AM By: Judene Companion MD Previous Signature: 02/14/2015 9:40:09 AM Version By: Ruthine Dose Entered By: Judene Companion on 02/14/2015 10:04:29 Julia Simmons ([id number]) -------------------------------------------------------------------------------- Lower Extremity Assessment Details Patient Name: Julia Shown D. Date of Service: 02/14/2015 9:30 AM Medical Record Number: [id number] Patient Account Number: [id number] Date of Birth/Sex: Nov 24, 1960 (54 y.o. Female) Treating RN: Cornell Barman Primary Care Physician: Glendon Axe Other Clinician: Referring Physician: Glendon Axe Treating Physician/Extender: Judene Companion Weeks in Treatment: 1 Edema Assessment Assessed: [Left: No] [Right: No] E[Left: dema] [Right: :] Calf Left: Right: Point of Measurement: 33 cm From Medial Instep cm 44 cm Ankle Left: Right: Point of Measurement: 10 cm From Medial Instep cm 25.5 cm Vascular Assessment Pulses: Posterior Tibial Dorsalis Pedis Palpable: [Right:Yes] Extremity colors, hair growth, and conditions: Extremity Color: [Right:Normal] Hair Growth on Extremity: [Right:Yes] Temperature of Extremity: [Right:Cool] Capillary Refill: [Right:< 3 seconds] Toe Nail Assessment Left: Right: Thick: No Discolored: No Deformed: No Improper Length and Hygiene: No Electronic Signature(s) Signed: 02/14/2015 4:49:46 PM By: Gretta Cool, RN, BSN, Kim RN, BSN Entered By: Gretta Cool, RN, BSN, Kim on 02/14/2015 09:20:15 Julia Simmons ([id number]) -------------------------------------------------------------------------------- Multi Wound Chart Details Patient Name: Julia Shown D. Date of  Service: 02/14/2015 9:30 AM Medical Record Number: [id number] Patient Account Number: [id number] Date of Birth/Sex: Jul 19, 1960 (54 y.o. Female) Treating RN: Cornell Barman Primary Care Physician: Glendon Axe Other Clinician: Referring Physician: Glendon Axe Treating Physician/Extender: Benjaman Pott in Treatment: 1 Vital Signs Height(in): 65 Pulse(bpm): 68 Weight(lbs): 336 Blood Pressure 169/79 (mmHg): Body Mass Index(BMI): 56 Temperature(F): 97.9 Respiratory Rate 18 (breaths/min): Photos: [1:No Photos] [N/A:N/A] Wound Location: [1:Right Foot - Plantar] [N/A:N/A] Wounding Event: [1:Gradually Appeared] [N/A:N/A] Primary Etiology: [1:Diabetic Wound/Ulcer of the Lower Extremity] [N/A:N/A] Comorbid History: [1:Hypertension, Type II Diabetes, Neuropathy] [N/A:N/A] Date Acquired: [1:10/17/2014] [N/A:N/A] Weeks of Treatment: [1:1] [  N/A:N/A] Wound Status: [1:Open] [N/A:N/A] Measurements L x W x D 0.9x1.6x0.2 [N/A:N/A] (cm) Area (cm) : [1:1.131] [N/A:N/A] Volume (cm) : [1:0.226] [N/A:N/A] % Reduction in Area: [1:42.80%] [N/A:N/A] % Reduction in Volume: 83.70% [N/A:N/A] Classification: [1:Grade 1] [N/A:N/A] Exudate Amount: [1:Medium] [N/A:N/A] Exudate Type: [1:Serous] [N/A:N/A] Exudate Color: [1:amber] [N/A:N/A] Wound Margin: [1:Flat and Intact] [N/A:N/A] Granulation Amount: [1:Large (67-100%)] [N/A:N/A] Granulation Quality: [1:Red] [N/A:N/A] Necrotic Amount: [1:None Present (0%)] [N/A:N/A] Exposed Structures: [1:Fascia: No Fat: No Tendon: No Muscle: No Joint: No Bone: No] [N/A:N/A] Limited to Skin Breakdown Epithelialization: Small (1-33%) N/A N/A Periwound Skin Texture: Callus: Yes N/A N/A Edema: No Excoriation: No Induration: No Crepitus: No Fluctuance: No Friable: No Rash: No Scarring: No Periwound Skin Moist: Yes N/A N/A Moisture: Maceration: No Dry/Scaly: No Periwound Skin Color: Atrophie Blanche: No N/A N/A Cyanosis: No Ecchymosis: No Erythema:  No Hemosiderin Staining: No Mottled: No Pallor: No Rubor: No Temperature: No Abnormality N/A N/A Tenderness on No N/A N/A Palpation: Wound Preparation: Ulcer Cleansing: N/A N/A Rinsed/Irrigated with Saline Topical Anesthetic Applied: Other: lidocaine 4% Treatment Notes Electronic Signature(s) Signed: 02/14/2015 4:49:46 PM By: Gretta Cool, RN, BSN, Kim RN, BSN Entered By: Gretta Cool, RN, BSN, Kim on 02/14/2015 09:26:13 Julia Simmons ([id number]) -------------------------------------------------------------------------------- Multi-Disciplinary Care Plan Details Patient Name: Julia Simmons, Julia Simmons. Date of Service: 02/14/2015 9:30 AM Medical Record Number: [id number] Patient Account Number: [id number] Date of Birth/Sex: [birth year] (54 y.o. Female) Treating RN: Cornell Barman Primary Care Physician: Glendon Axe Other Clinician: Referring Physician: Glendon Axe Treating Physician/Extender: Benjaman Pott in Treatment: 1 Active Inactive Abuse / Safety / Falls / Self Care Management Nursing Diagnoses: Impaired physical mobility Potential for falls Goals: Patient will remain injury free Date Initiated: 02/07/2015 Goal Status: Active Interventions: Assess fall risk on admission and as needed Notes: Orientation to the Wound Care Program Nursing Diagnoses: Knowledge deficit related to the wound healing center program Goals: Patient/caregiver will verbalize understanding of the [facility] Date Initiated: 02/07/2015 Goal Status: Active Interventions: Provide education on orientation to the wound center Notes: Peripheral Neuropathy Nursing Diagnoses: Potential alteration in peripheral tissue perfusion (select prior to confirmation of diagnosis) Goals: Patient/caregiver will verbalize understanding of disease process and disease management MIALANI, REICKS ([id number]) Date Initiated: 02/07/2015 Goal Status: Active Interventions: Assess signs and symptoms of  neuropathy upon admission and as needed Notes: Wound/Skin Impairment Nursing Diagnoses: Impaired tissue integrity Goals: Ulcer/skin breakdown will have a volume reduction of 30% by week 4 Date Initiated: 02/07/2015 Goal Status: Active Ulcer/skin breakdown will have a volume reduction of 50% by week 8 Date Initiated: 02/07/2015 Goal Status: Active Ulcer/skin breakdown will have a volume reduction of 80% by week 12 Date Initiated: 02/07/2015 Goal Status: Active Ulcer/skin breakdown will heal within 14 weeks Date Initiated: 02/07/2015 Goal Status: Active Interventions: Assess patient/caregiver ability to perform ulcer/skin care regimen upon admission and as needed Assess ulceration(s) every visit Notes: Electronic Signature(s) Signed: 02/14/2015 4:49:46 PM By: Gretta Cool, RN, BSN, Kim RN, BSN Entered By: Gretta Cool, RN, BSN, Kim on 02/14/2015 09:26:07 Julia Simmons ([id number]) -------------------------------------------------------------------------------- Pain Assessment Details Patient Name: Julia Shown D. Date of Service: 02/14/2015 9:30 AM Medical Record Number: [id number] Patient Account Number: [id number] Date of Birth/Sex: 03-22-1961 (54 y.o. Female) Treating RN: Cornell Barman Primary Care Physician: Glendon Axe Other Clinician: Referring Physician: Glendon Axe Treating Physician/Extender: Benjaman Pott in Treatment: 1 Active Problems Location of Pain Severity and Description of Pain Patient Has Paino No Site Locations Pain Management and Medication Current Pain Management: Electronic Signature(s) Signed: 02/14/2015 4:49:46  PM By: Gretta Cool, RN, BSN, Kim RN, BSN Entered By: Gretta Cool, RN, BSN, Kim on 02/14/2015 09:17:12 Julia Simmons ([id number]) -------------------------------------------------------------------------------- Patient/Caregiver Education Details Patient Name: Julia Simmons. Date of Service: 02/14/2015 9:30 AM Medical Record Number: [id number] Patient  Account Number: [id number] Date of Birth/Gender: February 03, 1961 (54 y.o. Female) Treating RN: Cornell Barman Primary Care Physician: Glendon Axe Other Clinician: Referring Physician: Glendon Axe Treating Physician/Extender: Benjaman Pott in Treatment: 1 Education Assessment Education Provided To: Patient Education Topics Provided Wound/Skin Impairment: Handouts: Caring for Your Ulcer, Other: continue wound care as prescribed Electronic Signature(s) Signed: 02/14/2015 10:04:38 AM By: Judene Companion MD Entered By: Judene Companion on 02/14/2015 10:04:38 Julia Simmons ([id number]) -------------------------------------------------------------------------------- Wound Assessment Details Patient Name: Julia Shown D. Date of Service: 02/14/2015 9:30 AM Medical Record Number: [id number] Patient Account Number: [id number] Date of Birth/Sex: 18-Aug-1960 (54 y.o. Female) Treating RN: Cornell Barman Primary Care Physician: Glendon Axe Other Clinician: Referring Physician: Glendon Axe Treating Physician/Extender: Judene Companion Weeks in Treatment: 1 Wound Status Wound Number: 1 Primary Diabetic Wound/Ulcer of the Lower Etiology: Extremity Wound Location: Right Foot - Plantar Wound Status: Open Wounding Event: Gradually Appeared Comorbid Hypertension, Type II Diabetes, Date Acquired: 10/17/2014 History: Neuropathy Weeks Of Treatment: 1 Clustered Wound: No Photos Photo Uploaded By: Gretta Cool, RN, BSN, Kim on 02/14/2015 11:21:15 Wound Measurements Length: (cm) 0.9 Width: (cm) 1.6 Depth: (cm) 0.2 Area: (cm) 1.131 Volume: (cm) 0.226 % Reduction in Area: 42.8% % Reduction in Volume: 83.7% Epithelialization: Small (1-33%) Tunneling: No Undermining: No Wound Description Classification: Grade 1 Wound Margin: Flat and Intact Exudate Amount: Medium Exudate Type: Serous Exudate Color: amber Foul Odor After Cleansing: No Wound Bed Granulation Amount: Large (67-100%) Exposed  Structure Granulation Quality: Red Fascia Exposed: No Necrotic Amount: None Present (0%) Fat Layer Exposed: No Tendon Exposed: No Julia Simmons, Julia D. ([id number]) Muscle Exposed: No Joint Exposed: No Bone Exposed: No Limited to Skin Breakdown Periwound Skin Texture Texture Color No Abnormalities Noted: No No Abnormalities Noted: No Callus: Yes Atrophie Blanche: No Crepitus: No Cyanosis: No Excoriation: No Ecchymosis: No Fluctuance: No Erythema: No Friable: No Hemosiderin Staining: No Induration: No Mottled: No Localized Edema: No Pallor: No Rash: No Rubor: No Scarring: No Temperature / Pain Moisture Temperature: No Abnormality No Abnormalities Noted: No Dry / Scaly: No Maceration: No Moist: Yes Wound Preparation Ulcer Cleansing: Rinsed/Irrigated with Saline Topical Anesthetic Applied: Other: lidocaine 4%, Treatment Notes Wound #1 (Right, Plantar Foot) 1. Cleansed with: Clean wound with Normal Saline 2. Anesthetic Topical Lidocaine 4% cream to wound bed prior to debridement 4. Dressing Applied: Aquacel Ag Other dressing (specify in notes) 5. Secondary Dressing Applied Gauze and Kerlix/Conform Notes felt around wound Electronic Signature(s) Signed: 02/14/2015 4:49:46 PM By: Gretta Cool, RN, BSN, Kim RN, BSN Entered By: Gretta Cool, RN, BSN, Kim on 02/14/2015 09:22:48 Julia Simmons ([id number]) -------------------------------------------------------------------------------- Vitals Details Patient Name: Julia Shown D. Date of Service: 02/14/2015 9:30 AM Medical Record Number: [id number] Patient Account Number: [id number] Date of Birth/Sex: 1960/10/04 (54 y.o. Female) Treating RN: Cornell Barman Primary Care Physician: Glendon Axe Other Clinician: Referring Physician: Glendon Axe Treating Physician/Extender: Benjaman Pott in Treatment: 1 Vital Signs Time Taken: 09:17 Temperature (F): 97.9 Height (in): 65 Pulse (bpm): 68 Weight (lbs): 336 Respiratory  Rate (breaths/min): 18 Body Mass Index (BMI): 55.9 Blood Pressure (mmHg): 169/79 Reference Range: 80 - 120 mg / dl Notes Patient states she took her medication this morning before her appointment. To follow up with PCP if BP remains high. MD notified. Electronic Signature(s) Signed:  02/14/2015 4:49:46 PM By: Gretta Cool, RN, BSN, Kim RN, BSN Entered By: Gretta Cool, RN, BSN, Kim on 02/14/2015 09:18:33

## 2015-02-15 NOTE — Progress Notes (Signed)
ANNASTON, UPHAM ([id number]) Visit Report for 02/14/2015 Chief Complaint Document Details Patient Name: Julia Simmons, Julia Simmons. Date of Service: 02/14/2015 9:30 AM Medical Record Number: [id number] Patient Account Number: [id number] Date of Birth/Sex: 08-Feb-1961 (54 y.o. Female) Treating RN: Cornell Barman Primary Care Physician: Glendon Axe Other Clinician: Referring Physician: Glendon Axe Treating Physician/Extender: Benjaman Pott in Treatment: 1 Information Obtained from: Patient Chief Complaint Patients presents for treatment of an open diabetic ulcer. 54 year old patient whose had a ulcerated area on her right midfoot for about 3 months. Electronic Signature(s) Signed: 02/14/2015 9:56:17 AM By: Judene Companion MD Entered By: Judene Companion on 02/14/2015 09:56:17 Julia Simmons ([id number]) -------------------------------------------------------------------------------- HPI Details Patient Name: Julia Shown D. Date of Service: 02/14/2015 9:30 AM Medical Record Number: [id number] Patient Account Number: [id number] Date of Birth/Sex: 31-Jul-1960 (54 y.o. Female) Treating RN: Cornell Barman Primary Care Physician: Glendon Axe Other Clinician: Referring Physician: Glendon Axe Treating Physician/Extender: Benjaman Pott in Treatment: 1 History of Present Illness Location: ulcerated area right midfoot Quality: Patient reports No Pain. Severity: Patient states wound are getting worse. Duration: Patient has had the wound for > 3 months prior to seeking treatment at the wound center Context: The wound appeared gradually over time Modifying Factors: Other treatment(s) tried include: She sees Dr. Cleda Mccreedy for her podiatry, she sees a orthopedic doctor at [facility] and he had prescribed a Crow walking boot. Associated Signs and Symptoms: Patient reports having difficulty standing for long periods. HPI Description: 54 year old patient who comes regarding her right lower extremity  with an open wound and history of Charcot's arthropathy for which she sees a podiatrist regularly and has been wearing a Crow walker. She has been seeing Dr. Cleda Mccreedy in the podiatry office.She was recently seen at the [place] office and given a gram of vancomycin on 01/28/2015. Her PCP is Dr. Ruffin Frederick who saw her in July and noted that the patient has been noncompliant with her diabetic care and her hemoglobin A1c improved from 10.2 in May to 8.1 on recent labs sheet. the patient's past medical history significant for diabetes mellitus type 2, hypertension, hyperlipidemia, obesity, Charcot foot due to diabetes mellitus and is status post cholecystectomy and cesarean section.during her last visit the Levemir medication dose was increased and she was also on NovoLog, Actos, Glucophage Electronic Signature(s) Signed: 02/14/2015 9:56:35 AM By: Judene Companion MD Entered By: Judene Companion on 02/14/2015 09:56:35 Julia Simmons ([id number]) -------------------------------------------------------------------------------- Callus Pairing Details Patient Name: Julia Shown D. Date of Service: 02/14/2015 9:30 AM Medical Record Number: [id number] Patient Account Number: [id number] Date of Birth/Sex: 1960/06/30 (54 y.o. Female) Treating RN: Cornell Barman Primary Care Physician: Glendon Axe Other Clinician: Referring Physician: Glendon Axe Treating Physician/Extender: Benjaman Pott in Treatment: 1 Procedure Performed for: Wound #1 Right,Plantar Foot Performed By: Physician Judene Companion, MD Post Procedure Diagnosis Same as Pre-procedure Electronic Signature(s) Signed: 02/14/2015 4:49:46 PM By: Gretta Cool RN, BSN, Kim RN, BSN Entered By: Gretta Cool, RN, BSN, Kim on 02/14/2015 09:30:59 Julia Simmons ([id number]) -------------------------------------------------------------------------------- Physical Exam Details Patient Name: Julia Simmons, Julia Simmons. Date of Service: 02/14/2015 9:30 AM Medical Record  Number: [id number] Patient Account Number: [id number] Date of Birth/Sex: 21-Jan-1961 (54 y.o. Female) Treating RN: Cornell Barman Primary Care Physician: Glendon Axe Other Clinician: Referring Physician: Glendon Axe Treating Physician/Extender: Benjaman Pott in Treatment: 1 Electronic Signature(s) Signed: 02/14/2015 9:56:45 AM By: Judene Companion MD Entered By: Judene Companion on 02/14/2015 09:56:45 Julia Simmons ([id number]) -------------------------------------------------------------------------------- Physician Orders Details Patient Name: Julia Shown D. Date of Service:  02/14/2015 9:30 AM Medical Record Number: [id number] Patient Account Number: [id number] Date of Birth/Sex: 15-Feb-1961 (54 y.o. Female) Treating RN: Cornell Barman Primary Care Physician: Glendon Axe Other Clinician: Referring Physician: Glendon Axe Treating Physician/Extender: Benjaman Pott in Treatment: 1 Verbal / Phone Orders: Yes Clinician: Cornell Barman Read Back and Verified: Yes Diagnosis Coding Wound Cleansing Wound #1 Right,Plantar Foot o Clean wound with Normal Saline. Anesthetic Wound #1 Right,Plantar Foot o Topical Lidocaine 4% cream applied to wound bed prior to debridement Primary Wound Dressing Wound #1 Right,Plantar Foot o Aquacel Ag o Other: - felt surrounding wound Secondary Dressing Wound #1 Right,Plantar Foot o Dry Gauze Dressing Change Frequency Wound #1 Right,Plantar Foot o Change dressing every other day. Follow-up Appointments Wound #1 Right,Plantar Foot o Return Appointment in 1 week. Off-Loading Wound #1 Right,Plantar Foot o Other: - crow boot Additional Orders / Instructions Wound #1 Right,Plantar Foot o Other: - work on controlling blood sugars, work on losing weight, go back and see Dr Cleda Mccreedy, go to [employer] for [employer] Julia Simmons, Julia Simmons ([id number]) Electronic Signature(s) Signed: 02/14/2015 4:49:46 PM By: Gretta Cool, RN, BSN, Kim  RN, BSN Entered By: Gretta Cool, RN, BSN, Kim on 02/14/2015 09:45:52 Julia Simmons ([id number]) -------------------------------------------------------------------------------- Problem List Details Patient Name: Julia Simmons. Date of Service: 02/14/2015 9:30 AM Medical Record Number: [id number] Patient Account Number: [id number] Date of Birth/Sex: 1960-09-19 (53 y.o. Female) Treating RN: Cornell Barman Primary Care Physician: Glendon Axe Other Clinician: Referring Physician: Glendon Axe Treating Physician/Extender: Benjaman Pott in Treatment: 1 Active Problems ICD-10 Encounter Code Description Active Date Diagnosis E11.621 Type 2 diabetes mellitus with foot ulcer 02/07/2015 Yes 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t with 02/07/2015 Yes fat layer exposed M14.671 Charcot's joint, right ankle and foot 02/07/2015 Yes E66.01 Morbid (severe) obesity due to excess calories 02/07/2015 Yes Inactive Problems Resolved Problems Electronic Signature(s) Signed: 02/14/2015 9:56:07 AM By: Judene Companion MD Entered By: Judene Companion on 02/14/2015 09:56:07 Julia Simmons ([id number]) -------------------------------------------------------------------------------- Progress Note Details Patient Name: Julia Shown D. Date of Service: 02/14/2015 9:30 AM Medical Record Number: [id number] Patient Account Number: [id number] Date of Birth/Sex: [birth year] (54 y.o. Female) Treating RN: Cornell Barman Primary Care Physician: Glendon Axe Other Clinician: Referring Physician: Glendon Axe Treating Physician/Extender: Benjaman Pott in Treatment: 1 Subjective Chief Complaint Information obtained from Patient Patients presents for treatment of an open diabetic ulcer. 54 year old patient whose had a ulcerated area on her right midfoot for about 3 months. History of Present Illness (HPI) The following HPI elements were documented for the patient's wound: Location: ulcerated  area right midfoot Quality: Patient reports No Pain. Severity: Patient states wound are getting worse. Duration: Patient has had the wound for > 3 months prior to seeking treatment at the wound center Context: The wound appeared gradually over time Modifying Factors: Other treatment(s) tried include: She sees Dr. Cleda Mccreedy for her podiatry, she sees a orthopedic doctor at [facility] and he had prescribed a Crow walking boot. Associated Signs and Symptoms: Patient reports having difficulty standing for long periods. 54 year old patient who comes regarding her right lower extremity with an open wound and history of Charcot's arthropathy for which she sees a podiatrist regularly and has been wearing a Crow walker. She has been seeing Dr. Cleda Mccreedy in the podiatry office.She was recently seen at the [place] office and given a gram of vancomycin on 01/28/2015. Her PCP is Dr. Ruffin Frederick who saw her in July and noted that the patient has been noncompliant  with her diabetic care and her hemoglobin A1c improved from 10.2 in May to 8.1 on recent labs sheet. the patient's past medical history significant for diabetes mellitus type 2, hypertension, hyperlipidemia, obesity, Charcot foot due to diabetes mellitus and is status post cholecystectomy and cesarean section.during her last visit the Levemir medication dose was increased and she was also on NovoLog, Actos, Glucophage Objective Constitutional Vitals Time Taken: 9:17 AM, Height: 65 in, Weight: 336 lbs, BMI: 55.9, Temperature: 97.9 F, Pulse: 68 bpm, Respiratory Rate: 18 breaths/min, Blood Pressure: 169/79 mmHg. General Notes: Patient states she took her medication this morning before her appointment. To follow up with PCP if BP remains high. MD notified. Julia Simmons, Julia Simmons ([id number]) Integumentary (Hair, Skin) Wound #1 status is Open. Original cause of wound was Gradually Appeared. The wound is located on the [place]. The wound measures  0.9cm length x 1.6cm width x 0.2cm depth; 1.131cm^2 area and 0.226cm^3 volume. The wound is limited to skin breakdown. There is no tunneling or undermining noted. There is a medium amount of serous drainage noted. The wound margin is flat and intact. There is large (67-100%) red granulation within the wound bed. There is no necrotic tissue within the wound bed. The periwound skin appearance exhibited: Callus, Moist. The periwound skin appearance did not exhibit: Crepitus, Excoriation, Fluctuance, Friable, Induration, Localized Edema, Rash, Scarring, Dry/Scaly, Maceration, Atrophie Blanche, Cyanosis, Ecchymosis, Hemosiderin Staining, Mottled, Pallor, Rubor, Erythema. Periwound temperature was noted as No Abnormality. Assessment Active Problems ICD-10 E11.621 - Type 2 diabetes mellitus with foot ulcer L97.412 - Non-pressure chronic ulcer of right heel and midfoot with fat layer exposed M14.671 - Charcot's joint, right ankle and foot E66.01 - Morbid (severe) obesity due to excess calories Procedures Wound #1 Wound #1 is a Diabetic Wound/Ulcer of the Lower Extremity located on the Right,Plantar Foot . An Callus Pairing procedure was performed by Judene Companion, MD. Post procedure Diagnosis Wound #1: Same as Pre-Procedure Plan Wound Cleansing: Wound #1 Right,Plantar Foot: Clean wound with Normal Saline. Anesthetic: Julia Simmons, Julia Simmons ([id number]) Wound #1 Right,Plantar Foot: Topical Lidocaine 4% cream applied to wound bed prior to debridement Primary Wound Dressing: Wound #1 Right,Plantar Foot: Aquacel Ag Other: - felt surrounding wound Secondary Dressing: Wound #1 Right,Plantar Foot: Dry Gauze Dressing Change Frequency: Wound #1 Right,Plantar Foot: Change dressing every other day. Follow-up Appointments: Wound #1 Right,Plantar Foot: Return Appointment in 1 week. Off-Loading: Wound #1 Right,Plantar Foot: Other: - crow boot Additional Orders / Instructions: Wound #1 Right,Plantar  Foot: Other: - work on controlling blood sugars, work on losing weight, go back and see Dr Cleda Mccreedy, go to [employer] for [employer] adjustment Follow-Up Appointments: A follow-up appointment should be scheduled. Medication Reconciliation completed and provided to Patient/Care Provider. A Patient Clinical Summary of Care was provided to [facility] debrided callusand dressed with Prisma. Offloading with felt and boot Electronic Signature(s) Signed: 02/14/2015 10:02:51 AM By: Judene Companion MD Entered By: Judene Companion on 02/14/2015 10:02:51 Julia Simmons ([id number]) -------------------------------------------------------------------------------- SuperBill Details Patient Name: Julia Shown D. Date of Service: 02/14/2015 Medical Record Number: [id number] Patient Account Number: [id number] Date of Birth/Sex: 1961/04/16 (54 y.o. Female) Treating RN: Cornell Barman Primary Care Physician: Glendon Axe Other Clinician: Referring Physician: Glendon Axe Treating Physician/Extender: Benjaman Pott in Treatment: 1 Diagnosis Coding ICD-10 Codes Code Description E11.621 Type 2 diabetes mellitus with foot ulcer 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t with fat layer exposed M14.671 Charcot's joint, right ankle and foot E66.01 Morbid (severe) obesity due  to excess calories Facility Procedures CPT4 Code: [id number] Description: 88110 - PARE BENIGN LES; SGL ICD-10 Description Diagnosis M14.671 Charcot's joint, right ankle and foot Modifier: Quantity: 1 Physician Procedures CPT4 Code: [id number] Description: [phone] - WC PHYS LEVEL 2 - EST PT ICD-10 Description Diagnosis E11.621 Type 2 diabetes mellitus with foot ulcer Modifier: Quantity: 1 CPT4 Code: [id number] Description: 81771 - WC PHYS PARE BENIGN LES; SGL ICD-10 Description Diagnosis M14.671 Charcot's joint, right ankle and foot Modifier: Quantity: 1 Electronic Signature(s) Signed: 02/14/2015 10:03:32 AM By: Judene Companion  MD Entered By: Judene Companion on 02/14/2015 10:03:32

## 2015-02-22 ENCOUNTER — Encounter: Payer: BLUE CROSS/BLUE SHIELD | Attending: Surgery | Admitting: Surgery

## 2015-02-22 DIAGNOSIS — M14671 Charcot's joint, right ankle and foot: Secondary | ICD-10-CM | POA: Diagnosis not present

## 2015-02-22 DIAGNOSIS — Z794 Long term (current) use of insulin: Secondary | ICD-10-CM | POA: Insufficient documentation

## 2015-02-22 DIAGNOSIS — L97412 Non-pressure chronic ulcer of right heel and midfoot with fat layer exposed: Secondary | ICD-10-CM | POA: Diagnosis present

## 2015-02-22 DIAGNOSIS — E11621 Type 2 diabetes mellitus with foot ulcer: Secondary | ICD-10-CM | POA: Diagnosis not present

## 2015-02-22 DIAGNOSIS — I10 Essential (primary) hypertension: Secondary | ICD-10-CM | POA: Diagnosis not present

## 2015-02-22 DIAGNOSIS — R600 Localized edema: Secondary | ICD-10-CM | POA: Insufficient documentation

## 2015-02-22 NOTE — Progress Notes (Signed)
RENALDA, LOCKLIN ([id number]) Visit Report for 02/22/2015 Arrival Information Details Patient Name: Julia Simmons, Julia Simmons. Date of Service: 02/22/2015 10:15 AM Medical Record Number: [id number] Patient Account Number: [id number] Date of Birth/Sex: Dec 07, 1960 (54 y.o. Female) Treating RN: Afful, RN, BSN, Velva Harman Primary Care Physician: Glendon Axe Other Clinician: Referring Physician: Glendon Axe Treating Physician/Extender: Frann Rider in Treatment: 2 Visit Information History Since Last Visit Any new allergies or adverse reactions: No Patient Arrived: Ambulatory Had a fall or experienced change in No Arrival Time: 10:09 activities of daily living that may affect Accompanied By: SELF risk of falls: Transfer Assistance: None Signs or symptoms of abuse/neglect since last No Patient Identification Verified: Yes visito Secondary Verification Process Yes Hospitalized since last visit: No Completed: Has Dressing in Place as Prescribed: Yes Patient Has Alerts: Yes Has Compression in Place as Prescribed: Yes Patient Alerts: DMII Pain Present Now: No Electronic Signature(s) Signed: 02/22/2015 10:09:58 AM By: Regan Lemming BSN, RN Entered By: Regan Lemming on 02/22/2015 10:09:58 Julia Simmons ([id number]) -------------------------------------------------------------------------------- Encounter Discharge Information Details Patient Name: Julia Shown D. Date of Service: 02/22/2015 10:15 AM Medical Record Number: [id number] Patient Account Number: [id number] Date of Birth/Sex: [birth year] (54 y.o. Female) Treating RN: Afful, RN, BSN, Velva Harman Primary Care Physician: Glendon Axe Other Clinician: Referring Physician: Glendon Axe Treating Physician/Extender: Frann Rider in Treatment: 2 Encounter Discharge Information Items Discharge Pain Level: 0 Discharge Condition: Stable Ambulatory Status: Ambulatory Discharge Destination: Home Transportation: Private Auto Accompanied  By: self Schedule Follow-up Appointment: No Medication Reconciliation completed and provided to Patient/Care No Eldo Umanzor: Provided on Clinical Summary of Care: 02/22/2015 Form Type Recipient Paper Patient [facility] Electronic Signature(s) Signed: 02/22/2015 10:37:16 AM By: Regan Lemming BSN, RN Previous Signature: 02/22/2015 10:36:00 AM Version By: Ruthine Dose Entered By: Regan Lemming on 02/22/2015 10:37:15 Julia Simmons ([id number]) -------------------------------------------------------------------------------- Lower Extremity Assessment Details Patient Name: Julia Shown D. Date of Service: 02/22/2015 10:15 AM Medical Record Number: [id number] Patient Account Number: [id number] Date of Birth/Sex: 11/20/1960 (54 y.o. Female) Treating RN: Afful, RN, BSN, Velva Harman Primary Care Physician: Glendon Axe Other Clinician: Referring Physician: Glendon Axe Treating Physician/Extender: Frann Rider in Treatment: 2 Edema Assessment Assessed: [Left: No] [Right: No] E[Left: dema] [Right: :] Calf Left: Right: Point of Measurement: 33 cm From Medial Instep cm 43.7 cm Ankle Left: Right: Point of Measurement: 10 cm From Medial Instep cm 25.4 cm Vascular Assessment Claudication: Claudication Assessment [Right:None] Pulses: Posterior Tibial Dorsalis Pedis Palpable: [Right:Yes] Extremity colors, hair growth, and conditions: Extremity Color: [Right:Normal] Hair Growth on Extremity: [Right:No] Temperature of Extremity: [Right:Warm] Capillary Refill: [Right:< 3 seconds] Toe Nail Assessment Left: Right: Thick: No Discolored: No Deformed: No Improper Length and Hygiene: No Electronic Signature(s) Signed: 02/22/2015 10:18:24 AM By: Regan Lemming BSN, RN Previous Signature: 02/22/2015 10:16:49 AM Version By: Regan Lemming BSN, RN Julia Simmons, Julia D. ([id number]) Entered By: Regan Lemming on 02/22/2015 10:18:23 Julia Simmons  ([id number]) -------------------------------------------------------------------------------- Multi Wound Chart Details Patient Name: Julia Shown D. Date of Service: 02/22/2015 10:15 AM Medical Record Number: [id number] Patient Account Number: [id number] Date of Birth/Sex: May 19, 1961 (54 y.o. Female) Treating RN: Baruch Gouty, RN, BSN, Velva Harman Primary Care Physician: Glendon Axe Other Clinician: Referring Physician: Glendon Axe Treating Physician/Extender: Frann Rider in Treatment: 2 Vital Signs Height(in): 65 Pulse(bpm): 81 Weight(lbs): 336 Blood Pressure 156/71 (mmHg): Body Mass Index(BMI): 56 Temperature(F): 98.2 Respiratory Rate 18 (breaths/min): Photos: [1:No Photos] [N/A:N/A] Wound Location: [1:Right Foot - Plantar] [N/A:N/A] Wounding Event: [1:Gradually Appeared] [N/A:N/A] Primary Etiology: [1:Diabetic Wound/Ulcer of the Lower Extremity] [N/A:N/A]  Comorbid History: [1:Hypertension, Type II Diabetes, Neuropathy] [N/A:N/A] Date Acquired: [1:10/17/2014] [N/A:N/A] Weeks of Treatment: [1:2] [N/A:N/A] Wound Status: [1:Open] [N/A:N/A] Measurements L x W x D 0.9x1.3x0.2 [N/A:N/A] (cm) Area (cm) : [1:0.919] [N/A:N/A] Volume (cm) : [1:0.184] [N/A:N/A] % Reduction in Area: [1:53.60%] [N/A:N/A] % Reduction in Volume: 86.70% [N/A:N/A] Classification: [1:Grade 1] [N/A:N/A] Exudate Amount: [1:Medium] [N/A:N/A] Exudate Type: [1:Serous] [N/A:N/A] Exudate Color: [1:amber] [N/A:N/A] Wound Margin: [1:Flat and Intact] [N/A:N/A] Granulation Amount: [1:Large (67-100%)] [N/A:N/A] Granulation Quality: [1:Red] [N/A:N/A] Necrotic Amount: [1:None Present (0%)] [N/A:N/A] Exposed Structures: [1:Fascia: No Fat: No Tendon: No Muscle: No Joint: No Bone: No] [N/A:N/A] Limited to Skin Breakdown Epithelialization: Small (1-33%) N/A N/A Periwound Skin Texture: Callus: Yes N/A N/A Edema: No Excoriation: No Induration: No Crepitus: No Fluctuance: No Friable: No Rash: No Scarring:  No Periwound Skin Moist: Yes N/A N/A Moisture: Maceration: No Dry/Scaly: No Periwound Skin Color: Atrophie Blanche: No N/A N/A Cyanosis: No Ecchymosis: No Erythema: No Hemosiderin Staining: No Mottled: No Pallor: No Rubor: No Temperature: No Abnormality N/A N/A Tenderness on No N/A N/A Palpation: Wound Preparation: Ulcer Cleansing: N/A N/A Rinsed/Irrigated with Saline Topical Anesthetic Applied: Other: lidocaine 4% Treatment Notes Electronic Signature(s) Signed: 02/22/2015 10:18:42 AM By: Regan Lemming BSN, RN Entered By: Regan Lemming on 02/22/2015 10:18:41 Julia Simmons ([id number]) -------------------------------------------------------------------------------- Multi-Disciplinary Care Plan Details Patient Name: Julia Simmons. Date of Service: 02/22/2015 10:15 AM Medical Record Number: [id number] Patient Account Number: [id number] Date of Birth/Sex: 10/11/1960 (54 y.o. Female) Treating RN: Afful, RN, BSN, Velva Harman Primary Care Physician: Glendon Axe Other Clinician: Referring Physician: Glendon Axe Treating Physician/Extender: Frann Rider in Treatment: 2 Active Inactive Abuse / Safety / Falls / Self Care Management Nursing Diagnoses: Impaired physical mobility Potential for falls Goals: Patient will remain injury free Date Initiated: 02/07/2015 Goal Status: Active Interventions: Assess fall risk on admission and as needed Notes: Orientation to the Wound Care Program Nursing Diagnoses: Knowledge deficit related to the wound healing center program Goals: Patient/caregiver will verbalize understanding of the [facility] Date Initiated: 02/07/2015 Goal Status: Active Interventions: Provide education on orientation to the wound center Notes: Peripheral Neuropathy Nursing Diagnoses: Potential alteration in peripheral tissue perfusion (select prior to confirmation of diagnosis) Goals: Patient/caregiver will verbalize understanding of  disease process and disease management Julia Simmons, Julia Simmons ([id number]) Date Initiated: 02/07/2015 Goal Status: Active Interventions: Assess signs and symptoms of neuropathy upon admission and as needed Notes: Wound/Skin Impairment Nursing Diagnoses: Impaired tissue integrity Goals: Ulcer/skin breakdown will have a volume reduction of 30% by week 4 Date Initiated: 02/07/2015 Goal Status: Active Ulcer/skin breakdown will have a volume reduction of 50% by week 8 Date Initiated: 02/07/2015 Goal Status: Active Ulcer/skin breakdown will have a volume reduction of 80% by week 12 Date Initiated: 02/07/2015 Goal Status: Active Ulcer/skin breakdown will heal within 14 weeks Date Initiated: 02/07/2015 Goal Status: Active Interventions: Assess patient/caregiver ability to perform ulcer/skin care regimen upon admission and as needed Assess ulceration(s) every visit Notes: Electronic Signature(s) Signed: 02/22/2015 10:18:32 AM By: Regan Lemming BSN, RN Entered By: Regan Lemming on 02/22/2015 10:18:32 Julia Simmons ([id number]) -------------------------------------------------------------------------------- Pain Assessment Details Patient Name: Julia Shown D. Date of Service: 02/22/2015 10:15 AM Medical Record Number: [id number] Patient Account Number: [id number] Date of Birth/Sex: 1961/03/07 (54 y.o. Female) Treating RN: Baruch Gouty, RN, BSN, Velva Harman Primary Care Physician: Glendon Axe Other Clinician: Referring Physician: Glendon Axe Treating Physician/Extender: Frann Rider in Treatment: 2 Active Problems Location of Pain Severity and Description of Pain Patient Has Paino No Site Locations  Pain Management and Medication Current Pain Management: Electronic Signature(s) Signed: 02/22/2015 10:11:56 AM By: Regan Lemming BSN, RN Entered By: Regan Lemming on 02/22/2015 10:11:55 Julia Simmons  ([id number]) -------------------------------------------------------------------------------- Patient/Caregiver Education Details Patient Name: Julia Shown D. Date of Service: 02/22/2015 10:15 AM Medical Record Number: [id number] Patient Account Number: [id number] Date of Birth/Gender: Jul 07, 1960 (54 y.o. Female) Treating RN: Baruch Gouty, RN, BSN, Velva Harman Primary Care Physician: Glendon Axe Other Clinician: Referring Physician: Glendon Axe Treating Physician/Extender: Frann Rider in Treatment: 2 Education Assessment Education Provided To: Patient Education Topics Provided Basic Hygiene: Methods: Explain/Verbal Responses: State content correctly Welcome To [facility]: Methods: Explain/Verbal Responses: State content correctly Electronic Signature(s) Signed: 02/22/2015 10:37:30 AM By: Regan Lemming BSN, RN Entered By: Regan Lemming on 02/22/2015 10:37:30 Julia Simmons ([id number]) -------------------------------------------------------------------------------- Wound Assessment Details Patient Name: Julia Shown D. Date of Service: 02/22/2015 10:15 AM Medical Record Number: [id number] Patient Account Number: [id number] Date of Birth/Sex: 1960-11-04 (54 y.o. Female) Treating RN: Afful, RN, BSN, [place] Primary Care Physician: Glendon Axe Other Clinician: Referring Physician: Glendon Axe Treating Physician/Extender: Frann Rider in Treatment: 2 Wound Status Wound Number: 1 Primary Diabetic Wound/Ulcer of the Lower Etiology: Extremity Wound Location: Right Foot - Plantar Wound Status: Open Wounding Event: Gradually Appeared Comorbid Hypertension, Type II Diabetes, Date Acquired: 10/17/2014 History: Neuropathy Weeks Of Treatment: 2 Clustered Wound: No Wound Measurements Length: (cm) 0.9 Width: (cm) 1.3 Depth: (cm) 0.2 Area: (cm) 0.919 Volume: (cm) 0.184 % Reduction in Area: 53.6% % Reduction in Volume: 86.7% Epithelialization: Small  (1-33%) Tunneling: No Undermining: No Wound Description Classification: Grade 1 Wound Margin: Flat and Intact Exudate Amount: Medium Exudate Type: Serous Exudate Color: amber Foul Odor After Cleansing: No Wound Bed Granulation Amount: Large (67-100%) Exposed Structure Granulation Quality: Red Fascia Exposed: No Necrotic Amount: None Present (0%) Fat Layer Exposed: No Tendon Exposed: No Muscle Exposed: No Joint Exposed: No Bone Exposed: No Limited to Skin Breakdown Periwound Skin Texture Texture Color No Abnormalities Noted: No No Abnormalities Noted: No Callus: Yes Atrophie Blanche: No Crepitus: No Cyanosis: No Excoriation: No Ecchymosis: No Fluctuance: No Erythema: No Julia Simmons, Julia D. ([id number]) Friable: No Hemosiderin Staining: No Induration: No Mottled: No Localized Edema: No Pallor: No Rash: No Rubor: No Scarring: No Temperature / Pain Moisture Temperature: No Abnormality No Abnormalities Noted: No Dry / Scaly: No Maceration: No Moist: Yes Wound Preparation Ulcer Cleansing: Rinsed/Irrigated with Saline Topical Anesthetic Applied: Other: lidocaine 4%, Treatment Notes Wound #1 (Right, Plantar Foot) 1. Cleansed with: Clean wound with Normal Saline 3. Peri-wound Care: Other peri-wound care (specify in notes) 4. Dressing Applied: Aquacel Ag 5. Secondary Dressing Applied Gauze and Kerlix/Conform 7. Secured with Tape Notes felt around wound Electronic Signature(s) Signed: 02/22/2015 10:16:21 AM By: Regan Lemming BSN, RN Entered By: Regan Lemming on 02/22/2015 10:16:20 Julia Simmons ([id number]) -------------------------------------------------------------------------------- Vitals Details Patient Name: Julia Shown D. Date of Service: 02/22/2015 10:15 AM Medical Record Number: [id number] Patient Account Number: [id number] Date of Birth/Sex: 1961/04/13 (54 y.o. Female) Treating RN: Afful, RN, BSN, [place] Primary Care Physician: Glendon Axe Other  Clinician: Referring Physician: Glendon Axe Treating Physician/Extender: Frann Rider in Treatment: 2 Vital Signs Time Taken: 10:12 Temperature (F): 98.2 Height (in): 65 Pulse (bpm): 81 Weight (lbs): 336 Respiratory Rate (breaths/min): 18 Body Mass Index (BMI): 55.9 Blood Pressure (mmHg): 156/71 Reference Range: 80 - 120 mg / dl Electronic Signature(s) Signed: 02/22/2015 10:12:17 AM By: Regan Lemming BSN, RN Entered By: Regan Lemming on 02/22/2015 10:12:17

## 2015-02-22 NOTE — Progress Notes (Addendum)
SHILA, KRUCZEK ([id number]) Visit Report for 02/22/2015 Debridement Details Patient Name: Julia Simmons, Julia Simmons. Date of Service: 02/22/2015 10:15 AM Medical Record Number: [id number] Patient Account Number: [id number] Date of Birth/Sex: 22-Jun-1960 (54 y.o. Female) Treating RN: Cornell Barman Primary Care Physician: Glendon Axe Other Clinician: Referring Physician: Glendon Axe Treating Physician/Extender: Frann Rider in Treatment: 2 Debridement Performed for Wound #1 Right,Plantar Foot Assessment: Performed By: Physician Pat Patrick., MD Debridement: Debridement Pre-procedure Yes Verification/Time Out Taken: Start Time: 10:20 Pain Control: Lidocaine 4% Topical Solution Level: Skin/Subcutaneous Tissue Total Area Debrided (L x 0.9 (cm) x 1.3 (cm) = 1.17 (cm) W): Tissue and other Non-Viable, Callus, Fibrin/Slough, Subcutaneous material debrided: Instrument: Curette Bleeding: Minimum Hemostasis Achieved: Pressure End Time: 10:23 Procedural Pain: 0 Post Procedural Pain: 0 Response to Treatment: Procedure was tolerated well Post Debridement Measurements of Total Wound Length: (cm) 0.9 Width: (cm) 1.3 Depth: (cm) 0.2 Volume: (cm) 0.184 Post Procedure Diagnosis Same as Pre-procedure Electronic Signature(s) Signed: 02/22/2015 5:22:53 PM By: Gretta Cool, RN, BSN, Kim RN, BSN Signed: 02/23/2015 5:16:53 PM By: Christin Fudge MD, FACS Previous Signature: 02/22/2015 10:24:14 AM Version By: Regan Lemming BSN, RN Previous Signature: 02/22/2015 4:40:03 PM Version By: Christin Fudge MD, FACS Julia Simmons, Julia Simmons ([id number]) Entered By: Gretta Cool RN, BSN, Kim on 02/22/2015 17:16:05 Julia Simmons, Julia Simmons ([id number]) -------------------------------------------------------------------------------- HPI Details Patient Name: Julia Simmons, Julia Simmons. Date of Service: 02/22/2015 10:15 AM Medical Record Number: [id number] Patient Account Number: [id number] Date of Birth/Sex: Aug 03, 1960 (54 y.o. Female) Treating RN:  Cornell Barman Primary Care Physician: Glendon Axe Other Clinician: Referring Physician: Glendon Axe Treating Physician/Extender: Frann Rider in Treatment: 2 History of Present Illness Location: ulcerated area right midfoot Quality: Patient reports No Pain. Severity: Patient states wound are getting worse. Duration: Patient has had the wound for > 3 months prior to seeking treatment at the wound center Context: The wound appeared gradually over time Modifying Factors: Other treatment(s) tried include: She sees Dr. Cleda Mccreedy for her podiatry, she sees a orthopedic doctor at [facility] and he had prescribed a Crow walking boot. Associated Signs and Symptoms: Patient reports having difficulty standing for long periods. HPI Description: 54 year old patient who comes regarding her right lower extremity with an open wound and history of Charcot's arthropathy for which she sees a podiatrist regularly and has been wearing a Crow walker. She has been seeing Dr. Cleda Mccreedy in the podiatry office.She was recently seen at the [place] office and given a gram of vancomycin on 01/28/2015. Her PCP is Dr. Glendon Axe who saw her in July and noted that the patient has been noncompliant with her diabetic care. Her hemoglobin A1c improved from 10.2 in May to 8.1 on recent labs sheet. The patient's past medical history significant for diabetes mellitus type 2, hypertension, hyperlipidemia, obesity, Charcot foot due to diabetes mellitus and is status post cholecystectomy and cesarean section.during her last visit the Levemir medication dose was increased and she was also on NovoLog, Actos, Glucophage 02/22/2015 -- recent X-ray of the right foot shows diffuse soft tissue swelling, diffuse osteopenia and degenerative changes. Deformity of the first second and third metatarsals consistent with old trauma and/ or infection with healing. No definite acute bony destructive changes are noted. If osteomyelitis is a  clinical concern an MRI has been recommended. She has not yet scheduled/received her appointment from Dr. Cleda Mccreedy and I have asked her to keep this. Electronic Signature(s) Signed: 02/22/2015 10:26:59 AM By: Christin Fudge MD, FACS Previous Signature: 02/22/2015 10:18:29 AM Version By: Christin Fudge MD,  FACS Entered By: Christin Fudge on 02/22/2015 10:26:59 Julia Simmons ([id number]) -------------------------------------------------------------------------------- Physical Exam Details Patient Name: Julia Simmons, Julia D. Date of Service: 02/22/2015 10:15 AM Medical Record Number: [id number] Patient Account Number: [id number] Date of Birth/Sex: 09/13/1960 (54 y.o. Female) Treating RN: Cornell Barman Primary Care Physician: Glendon Axe Other Clinician: Referring Physician: Glendon Axe Treating Physician/Extender: Frann Rider in Treatment: 2 Constitutional . Pulse regular. Respirations normal and unlabored. Afebrile. . Eyes Nonicteric. Reactive to light. Ears, Nose, Mouth, and Throat Lips, teeth, and gums WNL.Marland Kitchen Moist mucosa without lesions . Neck supple and nontender. No palpable supraclavicular or cervical adenopathy. Normal sized without goiter. Respiratory WNL. No retractions.. Cardiovascular Pedal Pulses WNL. No clubbing, cyanosis or edema. Chest Breasts symmetical and no nipple discharge.. Breast tissue WNL, no masses, lumps, or tenderness.. Lymphatic No adneopathy. No adenopathy. No adenopathy. Musculoskeletal Adexa without tenderness or enlargement.. Digits and nails w/o clubbing, cyanosis, infection, petechiae, ischemia, or inflammatory conditions.. Integumentary (Hair, Skin) No suspicious lesions. No crepitus or fluctuance. No peri-wound warmth or erythema. No masses.Marland Kitchen Psychiatric Judgement and insight Intact.. No evidence of depression, anxiety, or agitation.. Notes There is minimal callus surrounding the ulcer and this will be sharply debrided with a curette. The  wound edges were also sharpened and the ulcer base was freshened with a curette. Electronic Signature(s) Signed: 02/22/2015 10:27:43 AM By: Christin Fudge MD, FACS Entered By: Christin Fudge on 02/22/2015 10:27:42 Julia Simmons ([id number]) -------------------------------------------------------------------------------- Physician Orders Details Patient Name: Julia Shown D. Date of Service: 02/22/2015 10:15 AM Medical Record Number: [id number] Patient Account Number: [id number] Date of Birth/Sex: 1961/04/23 (54 y.o. Female) Treating RN: Baruch Gouty, RN, BSN, Velva Harman Primary Care Physician: Glendon Axe Other Clinician: Referring Physician: Glendon Axe Treating Physician/Extender: Frann Rider in Treatment: 2 Verbal / Phone Orders: Yes Clinician: Afful, RN, BSN, Rita Read Back and Verified: Yes Diagnosis Coding Wound Cleansing Wound #1 Right,Plantar Foot o Clean wound with Normal Saline. Anesthetic Wound #1 Right,Plantar Foot o Topical Lidocaine 4% cream applied to wound bed prior to debridement Primary Wound Dressing Wound #1 Right,Plantar Foot o Aquacel Ag o Other: - felt surrounding wound Secondary Dressing Wound #1 Right,Plantar Foot o Dry Gauze Dressing Change Frequency Wound #1 Right,Plantar Foot o Change dressing every other day. Follow-up Appointments Wound #1 Right,Plantar Foot o Return Appointment in 1 week. Off-Loading Wound #1 Right,Plantar Foot o Other: - crow boot Additional Orders / Instructions Wound #1 Right,Plantar Foot o Other: - work on controlling blood sugars, work on losing weight, go back and see Dr Cleda Mccreedy, go to [employer] for [employer] Julia Simmons, Julia Simmons ([id number]) Electronic Signature(s) Signed: 02/22/2015 10:35:58 AM By: Regan Lemming BSN, RN Signed: 02/22/2015 4:40:03 PM By: Christin Fudge MD, FACS Entered By: Regan Lemming on 02/22/2015 10:35:58 Julia Simmons  ([id number]) -------------------------------------------------------------------------------- Progress Note Details Patient Name: Julia Shown D. Date of Service: 02/22/2015 10:15 AM Medical Record Number: [id number] Patient Account Number: [id number] Date of Birth/Sex: January 01, 1961 (54 y.o. Female) Treating RN: Cornell Barman Primary Care Physician: Glendon Axe Other Clinician: Referring Physician: Glendon Axe Treating Physician/Extender: Frann Rider in Treatment: 2 Subjective History of Present Illness (HPI) The following HPI elements were documented for the patient's wound: Location: ulcerated area right midfoot Quality: Patient reports No Pain. Severity: Patient states wound are getting worse. Duration: Patient has had the wound for > 3 months prior to seeking treatment at the wound center Context: The wound appeared gradually over time Modifying Factors: Other treatment(s) tried include: She sees Dr. Cleda Mccreedy for her  podiatry, she sees a orthopedic doctor at [facility] and he had prescribed a Crow walking boot. Associated Signs and Symptoms: Patient reports having difficulty standing for long periods. 54 year old patient who comes regarding her right lower extremity with an open wound and history of Charcot's arthropathy for which she sees a podiatrist regularly and has been wearing a Crow walker. She has been seeing Dr. Cleda Mccreedy in the podiatry office.She was recently seen at the [address] office and given a gram of vancomycin on 01/28/2015. Her PCP is Dr. Glendon Axe who saw her in July and noted that the patient has been noncompliant with her diabetic care. Her hemoglobin A1c improved from 10.2 in May to 8.1 on recent labs sheet. The patient's past medical history significant for diabetes mellitus type 2, hypertension, hyperlipidemia, obesity, Charcot foot due to diabetes mellitus and is status post cholecystectomy and cesarean section.during her last visit the Levemir  medication dose was increased and she was also on NovoLog, Actos, Glucophage 02/22/2015 -- recent X-ray of the right foot shows diffuse soft tissue swelling, diffuse osteopenia and degenerative changes. Deformity of the first second and third metatarsals consistent with old trauma and/ or infection with healing. No definite acute bony destructive changes are noted. If osteomyelitis is a clinical concern an MRI has been recommended. She has not yet scheduled/received her appointment from Dr. Cleda Mccreedy and I have asked her to keep this. Objective Constitutional Pulse regular. Respirations normal and unlabored. Afebrile. Vitals Time Taken: 10:12 AM, Height: 65 in, Weight: 336 lbs, BMI: 55.9, Temperature: 98.2 F, Pulse: 81 bpm, Respiratory Rate: 18 breaths/min, Blood Pressure: 156/71 mmHg. Julia Simmons, Julia Simmons ([id number]) Eyes Nonicteric. Reactive to light. Ears, Nose, Mouth, and Throat Lips, teeth, and gums WNL.Marland Kitchen Moist mucosa without lesions . Neck supple and nontender. No palpable supraclavicular or cervical adenopathy. Normal sized without goiter. Respiratory WNL. No retractions.. Cardiovascular Pedal Pulses WNL. No clubbing, cyanosis or edema. Chest Breasts symmetical and no nipple discharge.. Breast tissue WNL, no masses, lumps, or tenderness.. Lymphatic No adneopathy. No adenopathy. No adenopathy. Musculoskeletal Adexa without tenderness or enlargement.. Digits and nails w/o clubbing, cyanosis, infection, petechiae, ischemia, or inflammatory conditions.Marland Kitchen Psychiatric Judgement and insight Intact.. No evidence of depression, anxiety, or agitation.. General Notes: There is minimal callus surrounding the ulcer and this will be sharply debrided with a curette. The wound edges were also sharpened and the ulcer base was freshened with a curette. Integumentary (Hair, Skin) No suspicious lesions. No crepitus or fluctuance. No peri-wound warmth or erythema. No masses.. Wound #1 status is Open.  Original cause of wound was Gradually Appeared. The wound is located on the [place]. The wound measures 0.9cm length x 1.3cm width x 0.2cm depth; 0.919cm^2 area and 0.184cm^3 volume. The wound is limited to skin breakdown. There is no tunneling or undermining noted. There is a medium amount of serous drainage noted. The wound margin is flat and intact. There is large (67-100%) red granulation within the wound bed. There is no necrotic tissue within the wound bed. The periwound skin appearance exhibited: Callus, Moist. The periwound skin appearance did not exhibit: Crepitus, Excoriation, Fluctuance, Friable, Induration, Localized Edema, Rash, Scarring, Dry/Scaly, Maceration, Atrophie Blanche, Cyanosis, Ecchymosis, Hemosiderin Staining, Mottled, Pallor, Rubor, Erythema. Periwound temperature was noted as No Abnormality. Assessment Julia Simmons, Julia Simmons. ([id number]) I have recommended continuing with silver alginate a felt offloading and the offloading shoe she has been using. He is also urged to schedule an appointment to see her podiatrist Dr. Cleda Mccreedy. She will come back and  see me next week. Procedures Wound #1 Wound #1 is a Diabetic Wound/Ulcer of the Lower Extremity located on the [place] . There was a Skin/Subcutaneous Tissue Debridement ([id number]) debridement with total area of 1.17 sq cm performed by Pat Patrick., MD. with the following instrument(s): Curette to remove Non-Viable tissue/material including Fibrin/Slough, Callus, and Subcutaneous after achieving pain control using Lidocaine 4% Topical Solution. A time out was conducted prior to the start of the procedure. A Minimum amount of bleeding was controlled with Pressure. The procedure was tolerated well with a pain level of 0 throughout and a pain level of 0 following the procedure. Post Debridement Measurements: 0.9cm length x 1.3cm width x 0.2cm depth; 0.184cm^3 volume. Post procedure Diagnosis Wound #1:  Same as Pre-Procedure Plan Wound Cleansing: Wound #1 Right,Plantar Foot: Clean wound with Normal Saline. Anesthetic: Wound #1 Right,Plantar Foot: Topical Lidocaine 4% cream applied to wound bed prior to debridement Primary Wound Dressing: Wound #1 Right,Plantar Foot: Aquacel Ag Other: - felt surrounding wound Secondary Dressing: Wound #1 Right,Plantar Foot: Dry Gauze Dressing Change Frequency: Wound #1 Right,Plantar Foot: Change dressing every other day. Follow-up Appointments: Wound #1 Right,Plantar Foot: Return Appointment in 1 week. Off-Loading: Wound #1 Right,Plantar Foot: Julia Simmons, Julia Simmons. ([id number]) Other: - crow boot Additional Orders / Instructions: Wound #1 Right,Plantar Foot: Other: - work on controlling blood sugars, work on losing weight, go back and see Dr Cleda Mccreedy, go to [employer] for [employer] adjustment I have recommended continuing with silver alginate a felt offloading and the offloading shoe she has been using. He is also urged to schedule an appointment to see her podiatrist Dr. Cleda Mccreedy. She will come back and see me next week. Electronic Signature(s) Signed: 02/25/2015 4:35:23 PM By: Christin Fudge MD, FACS Previous Signature: 02/22/2015 4:37:39 PM Version By: Christin Fudge MD, FACS Previous Signature: 02/22/2015 10:28:41 AM Version By: Christin Fudge MD, FACS Entered By: Christin Fudge on 02/25/2015 16:35:23 Julia Simmons ([id number]) -------------------------------------------------------------------------------- SuperBill Details Patient Name: Julia Shown D. Date of Service: 02/22/2015 Medical Record Number: [id number] Patient Account Number: [id number] Date of Birth/Sex: 26-Nov-1960 (54 y.o. Female) Treating RN: Cornell Barman Primary Care Physician: Glendon Axe Other Clinician: Referring Physician: Glendon Axe Treating Physician/Extender: Frann Rider in Treatment: 2 Diagnosis Coding ICD-10 Codes Code Description E11.621 Type 2 diabetes  mellitus with foot ulcer 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t with fat layer exposed M14.671 Charcot's joint, right ankle and foot E66.01 Morbid (severe) obesity due to excess calories Facility Procedures CPT4 Code Description: [id number] 11042 - DEB SUBQ TISSUE 20 SQ CM/< ICD-10 Description Diagnosis E11.621 Type 2 diabetes mellitus with foot ulcer 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 M14.671 Charcot's joint, right ankle and foot  E66.01 Morbid (severe) obesity due to excess calories Modifier: t with fat la Quantity: 1 yer exposed Physician Procedures CPT4 Code Description: [id number] 99357 - WC PHYS SUBQ TISS 20 SQ CM ICD-10 Description Diagnosis E11.621 Type 2 diabetes mellitus with foot ulcer 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 M14.671 Charcot's joint, right ankle and foot  E66.01 Morbid (severe) obesity due to excess calories Modifier: t with fat lay Quantity: 1 er exposed Electronic Signature(s) Signed: 02/22/2015 5:22:53 PM By: Gretta Cool, RN, BSN, Kim RN, BSN Signed: 02/23/2015 5:16:53 PM By: Christin Fudge MD, FACS Previous Signature: 02/22/2015 10:28:57 AM Version By: Christin Fudge MD, FACS Entered By: Gretta Cool RN, BSN, Kim on 02/22/2015 17:16:45

## 2015-02-28 ENCOUNTER — Encounter: Payer: BLUE CROSS/BLUE SHIELD | Admitting: Surgery

## 2015-02-28 DIAGNOSIS — L97412 Non-pressure chronic ulcer of right heel and midfoot with fat layer exposed: Secondary | ICD-10-CM | POA: Diagnosis not present

## 2015-03-01 NOTE — Progress Notes (Signed)
DIOR, DOMINIK ([id number]) Visit Report for 02/28/2015 Chief Complaint Document Details Patient Name: Julia Simmons, Julia Simmons. Date of Service: 02/28/2015 10:00 AM Medical Record Number: [id number] Patient Account Number: [id number] Date of Birth/Sex: 08/04/1960 (54 y.o. Female) Treating RN: Montey Hora Primary Care Physician: Glendon Axe Other Clinician: Referring Physician: Glendon Axe Treating Physician/Extender: Frann Rider in Treatment: 3 Information Obtained from: Patient Chief Complaint Patients presents for treatment of an open diabetic ulcer. 54 year old patient whose had a ulcerated area on her right midfoot for about 3 months. Electronic Signature(s) Signed: 02/28/2015 10:33:48 AM By: Christin Fudge MD, FACS Entered By: Christin Fudge on 02/28/2015 10:33:48 Julia Simmons ([id number]) -------------------------------------------------------------------------------- Debridement Details Patient Name: Julia Shown D. Date of Service: 02/28/2015 10:00 AM Medical Record Number: [id number] Patient Account Number: [id number] Date of Birth/Sex: [birth year] (54 y.o. Female) Treating RN: Montey Hora Primary Care Physician: Glendon Axe Other Clinician: Referring Physician: Glendon Axe Treating Physician/Extender: Frann Rider in Treatment: 3 Debridement Performed for Wound #1 Right,Plantar Foot Assessment: Performed By: Physician Pat Patrick., MD Debridement: Debridement Pre-procedure Yes Verification/Time Out Taken: Start Time: 10:15 Pain Control: Lidocaine 4% Topical Solution Level: Skin/Subcutaneous Tissue Total Area Debrided (L x 0.9 (cm) x 1.1 (cm) = 0.99 (cm) W): Tissue and other Viable, Non-Viable, Callus, Fibrin/Slough, Subcutaneous material debrided: Instrument: Curette Bleeding: Minimum Hemostasis Achieved: Pressure End Time: 10:19 Procedural Pain: 0 Post Procedural Pain: 0 Response to Treatment: Procedure was tolerated  well Post Debridement Measurements of Total Wound Length: (cm) 0.9 Width: (cm) 1.1 Depth: (cm) 0.3 Volume: (cm) 0.233 Post Procedure Diagnosis Same as Pre-procedure Electronic Signature(s) Signed: 02/28/2015 10:33:42 AM By: Christin Fudge MD, FACS Signed: 02/28/2015 4:36:49 PM By: Montey Hora Entered By: Christin Fudge on 02/28/2015 10:33:42 Julia Simmons ([id number]) -------------------------------------------------------------------------------- HPI Details Patient Name: Julia Shown D. Date of Service: 02/28/2015 10:00 AM Medical Record Number: [id number] Patient Account Number: [id number] Date of Birth/Sex: 11/21/1960 (54 y.o. Female) Treating RN: Montey Hora Primary Care Physician: Glendon Axe Other Clinician: Referring Physician: Glendon Axe Treating Physician/Extender: Frann Rider in Treatment: 3 History of Present Illness Location: ulcerated area right midfoot Quality: Patient reports No Pain. Severity: Patient states wound are getting worse. Duration: Patient has had the wound for > 3 months prior to seeking treatment at the wound center Context: The wound appeared gradually over time Modifying Factors: Other treatment(s) tried include: She sees Dr. Cleda Mccreedy for her podiatry, she sees a orthopedic doctor at [facility] and he had prescribed a Crow walking boot. Associated Signs and Symptoms: Patient reports having difficulty standing for long periods. HPI Description: 54 year old patient who comes regarding her right lower extremity with an open wound and history of Charcot's arthropathy for which she sees a podiatrist regularly and has been wearing a Crow walker. She has been seeing Dr. Cleda Mccreedy in the podiatry office.She was recently seen at the [place] office and given a gram of vancomycin on 01/28/2015. Her PCP is Dr. Glendon Axe who saw her in July and noted that the patient has been noncompliant with her diabetic care. Her hemoglobin A1c improved  from 10.2 in May to 8.1 on recent labs sheet. The patient's past medical history significant for diabetes mellitus type 2, hypertension, hyperlipidemia, obesity, Charcot foot due to diabetes mellitus and is status post cholecystectomy and cesarean section.during her last visit the Levemir medication dose was increased and she was also on NovoLog, Actos, Glucophage 02/22/2015 -- recent X-ray of the right foot shows diffuse soft tissue swelling, diffuse osteopenia and degenerative changes.  Deformity of the first second and third metatarsals consistent with old trauma and/ or infection with healing. No definite acute bony destructive changes are noted. If osteomyelitis is a clinical concern an MRI has been recommended. She has not yet scheduled/received her appointment from Dr. Cleda Mccreedy and I have asked her to keep this. 02/28/2015 -- for financial reasons she has not yet seen Dr. Cleda Mccreedy not she got her Milinda Cave boot fixed appropriately by either [facility] or the prosthetic department there. Electronic Signature(s) Signed: 02/28/2015 10:34:50 AM By: Christin Fudge MD, FACS Entered By: Christin Fudge on 02/28/2015 10:34:49 Julia Simmons ([id number]) -------------------------------------------------------------------------------- Physical Exam Details Patient Name: Julia Shown D. Date of Service: 02/28/2015 10:00 AM Medical Record Number: [id number] Patient Account Number: [id number] Date of Birth/Sex: September 24, 1960 (54 y.o. Female) Treating RN: Montey Hora Primary Care Physician: Glendon Axe Other Clinician: Referring Physician: Glendon Axe Treating Physician/Extender: Frann Rider in Treatment: 3 Constitutional . Pulse regular. Respirations normal and unlabored. Afebrile. . Eyes Nonicteric. Reactive to light. Ears, Nose, Mouth, and Throat Lips, teeth, and gums WNL.Marland Kitchen Moist mucosa without lesions . Neck supple and nontender. No palpable supraclavicular or cervical adenopathy. Normal sized  without goiter. Respiratory WNL. No retractions.. Cardiovascular Pedal Pulses WNL. No clubbing, cyanosis or edema. Lymphatic No adneopathy. No adenopathy. No adenopathy. Musculoskeletal Adexa without tenderness or enlargement.. Digits and nails w/o clubbing, cyanosis, infection, petechiae, ischemia, or inflammatory conditions.. Integumentary (Hair, Skin) No suspicious lesions. No crepitus or fluctuance. No peri-wound warmth or erythema. No masses.Marland Kitchen Psychiatric Judgement and insight Intact.. No evidence of depression, anxiety, or agitation.. Notes after using a curette I have sharply dissected the base of the ulcer and the surrounding callus and I freshened the edges of the wound. Electronic Signature(s) Signed: 02/28/2015 10:35:31 AM By: Christin Fudge MD, FACS Entered By: Christin Fudge on 02/28/2015 10:35:31 Julia Simmons ([id number]) -------------------------------------------------------------------------------- Physician Orders Details Patient Name: Julia Shown D. Date of Service: 02/28/2015 10:00 AM Medical Record Number: [id number] Patient Account Number: [id number] Date of Birth/Sex: 07/16/1960 (54 y.o. Female) Treating RN: Montey Hora Primary Care Physician: Glendon Axe Other Clinician: Referring Physician: Glendon Axe Treating Physician/Extender: Frann Rider in Treatment: 3 Verbal / Phone Orders: Yes Clinician: Dorthy, Joanna Read Back and Verified: Yes Diagnosis Coding Wound Cleansing Wound #1 Right,Plantar Foot o Clean wound with Normal Saline. Anesthetic Wound #1 Right,Plantar Foot o Topical Lidocaine 4% cream applied to wound bed prior to debridement Primary Wound Dressing Wound #1 Right,Plantar Foot o Aquacel Ag o Other: - felt surrounding wound Secondary Dressing Wound #1 Right,Plantar Foot o Dry Gauze Dressing Change Frequency Wound #1 Right,Plantar Foot o Change dressing every other day. Follow-up Appointments Wound  #1 Right,Plantar Foot o Return Appointment in 1 week. Off-Loading Wound #1 Right,Plantar Foot o Other: - crow boot Additional Orders / Instructions Wound #1 Right,Plantar Foot o Other: - work on controlling blood sugars, work on losing weight, go back and see Dr Cleda Mccreedy, go to [employer] for [employer] Julia Simmons, Julia Simmons ([id number]) Electronic Signature(s) Signed: 02/28/2015 4:01:57 PM By: Christin Fudge MD, FACS Signed: 02/28/2015 4:36:49 PM By: Montey Hora Entered By: Montey Hora on 02/28/2015 10:20:51 Julia Simmons ([id number]) -------------------------------------------------------------------------------- Problem List Details Patient Name: Julia Shown D. Date of Service: 02/28/2015 10:00 AM Medical Record Number: [id number] Patient Account Number: [id number] Date of Birth/Sex: May 20, 1961 (54 y.o. Female) Treating RN: Montey Hora Primary Care Physician: Glendon Axe Other Clinician: Referring Physician: Glendon Axe Treating Physician/Extender: Frann Rider in Treatment: 3 Active Problems ICD-10 Encounter Code  Description Active Date Diagnosis E11.621 Type 2 diabetes mellitus with foot ulcer 02/07/2015 Yes 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t with 02/07/2015 Yes fat layer exposed M14.671 Charcot's joint, right ankle and foot 02/07/2015 Yes E66.01 Morbid (severe) obesity due to excess calories 02/07/2015 Yes Inactive Problems Resolved Problems Electronic Signature(s) Signed: 02/28/2015 10:33:32 AM By: Christin Fudge MD, FACS Entered By: Christin Fudge on 02/28/2015 10:33:32 Julia Simmons ([id number]) -------------------------------------------------------------------------------- Progress Note Details Patient Name: Julia Shown D. Date of Service: 02/28/2015 10:00 AM Medical Record Number: [id number] Patient Account Number: [id number] Date of Birth/Sex: [birth year] (54 y.o. Female) Treating RN: Montey Hora Primary  Care Physician: Glendon Axe Other Clinician: Referring Physician: Glendon Axe Treating Physician/Extender: Frann Rider in Treatment: 3 Subjective Chief Complaint Information obtained from Patient Patients presents for treatment of an open diabetic ulcer. 54 year old patient whose had a ulcerated area on her right midfoot for about 3 months. History of Present Illness (HPI) The following HPI elements were documented for the patient's wound: Location: ulcerated area right midfoot Quality: Patient reports No Pain. Severity: Patient states wound are getting worse. Duration: Patient has had the wound for > 3 months prior to seeking treatment at the wound center Context: The wound appeared gradually over time Modifying Factors: Other treatment(s) tried include: She sees Dr. Cleda Mccreedy for her podiatry, she sees a orthopedic doctor at [facility] and he had prescribed a Crow walking boot. Associated Signs and Symptoms: Patient reports having difficulty standing for long periods. 54 year old patient who comes regarding her right lower extremity with an open wound and history of Charcot's arthropathy for which she sees a podiatrist regularly and has been wearing a Crow walker. She has been seeing Dr. Cleda Mccreedy in the podiatry office.She was recently seen at the [place] office and given a gram of vancomycin on 01/28/2015. Her PCP is Dr. Glendon Axe who saw her in July and noted that the patient has been noncompliant with her diabetic care. Her hemoglobin A1c improved from 10.2 in May to 8.1 on recent labs sheet. The patient's past medical history significant for diabetes mellitus type 2, hypertension, hyperlipidemia, obesity, Charcot foot due to diabetes mellitus and is status post cholecystectomy and cesarean section.during her last visit the Levemir medication dose was increased and she was also on NovoLog, Actos, Glucophage 02/22/2015 -- recent X-ray of the right foot shows diffuse soft  tissue swelling, diffuse osteopenia and degenerative changes. Deformity of the first second and third metatarsals consistent with old trauma and/ or infection with healing. No definite acute bony destructive changes are noted. If osteomyelitis is a clinical concern an MRI has been recommended. She has not yet scheduled/received her appointment from Dr. Cleda Mccreedy and I have asked her to keep this. 02/28/2015 -- for financial reasons she has not yet seen Dr. Cleda Mccreedy not she got her Milinda Cave boot fixed appropriately by either [facility] or the prosthetic department there. Julia Simmons, Julia Simmons ([id number]) Objective Constitutional Pulse regular. Respirations normal and unlabored. Afebrile. Vitals Time Taken: 10:01 AM, Height: 65 in, Weight: 336 lbs, BMI: 55.9, Temperature: 97.9 F, Pulse: 73 bpm, Respiratory Rate: 18 breaths/min, Blood Pressure: 167/72 mmHg. Eyes Nonicteric. Reactive to light. Ears, Nose, Mouth, and Throat Lips, teeth, and gums WNL.Marland Kitchen Moist mucosa without lesions . Neck supple and nontender. No palpable supraclavicular or cervical adenopathy. Normal sized without goiter. Respiratory WNL. No retractions.. Cardiovascular Pedal Pulses WNL. No clubbing, cyanosis or edema. Lymphatic No adneopathy. No adenopathy. No adenopathy. Musculoskeletal Adexa without tenderness or enlargement.. Digits and nails  w/o clubbing, cyanosis, infection, petechiae, ischemia, or inflammatory conditions.Marland Kitchen Psychiatric Judgement and insight Intact.. No evidence of depression, anxiety, or agitation.. General Notes: after using a curette I have sharply dissected the base of the ulcer and the surrounding callus and I freshened the edges of the wound. Integumentary (Hair, Skin) No suspicious lesions. No crepitus or fluctuance. No peri-wound warmth or erythema. No masses.. Wound #1 status is Open. Original cause of wound was Gradually Appeared. The wound is located on the [place]. The wound measures 0.9cm length  x 1.1cm width x 0.3cm depth; 0.778cm^2 area and 0.233cm^3 volume. The wound is limited to skin breakdown. There is no tunneling or undermining noted. There is a medium amount of serous drainage noted. The wound margin is flat and intact. There is large (67-100%) red granulation within the wound bed. There is no necrotic tissue within the wound bed. The periwound skin appearance exhibited: Callus, Moist. The periwound skin appearance did not exhibit: Julia Simmons, Julia Simmons. ([id number]) Crepitus, Excoriation, Fluctuance, Friable, Induration, Localized Edema, Rash, Scarring, Dry/Scaly, Maceration, Atrophie Blanche, Cyanosis, Ecchymosis, Hemosiderin Staining, Mottled, Pallor, Rubor, Erythema. Periwound temperature was noted as No Abnormality. Assessment Active Problems ICD-10 E11.621 - Type 2 diabetes mellitus with foot ulcer L97.412 - Non-pressure chronic ulcer of right heel and midfoot with fat layer exposed M14.671 - Charcot's joint, right ankle and foot E66.01 - Morbid (severe) obesity due to excess calories We will continue with silver alginate and an appropriate offloading felt and she will continue using her current walking boot. I have again explained the rationale why she should get this off loading boot fixed and why she should see the podiatrist regarding her Charcot's foot. She says as soon as she can get over her finances fixed she will work with these 2 departments. Procedures Wound #1 Wound #1 is a Diabetic Wound/Ulcer of the Lower Extremity located on the [place] . There was a Skin/Subcutaneous Tissue Debridement ([id number]) debridement with total area of 0.99 sq cm performed by Pat Patrick., MD. with the following instrument(s): Curette to remove Viable and Non-Viable tissue/material including Fibrin/Slough, Callus, and Subcutaneous after achieving pain control using Lidocaine 4% Topical Solution. A time out was conducted prior to the start of the procedure. A  Minimum amount of bleeding was controlled with Pressure. The procedure was tolerated well with a pain level of 0 throughout and a pain level of 0 following the procedure. Post Debridement Measurements: 0.9cm length x 1.1cm width x 0.3cm depth; 0.233cm^3 volume. Post procedure Diagnosis Wound #1: Same as Pre-Procedure Plan Julia Simmons, Julia Simmons. ([id number]) Wound Cleansing: Wound #1 Right,Plantar Foot: Clean wound with Normal Saline. Anesthetic: Wound #1 Right,Plantar Foot: Topical Lidocaine 4% cream applied to wound bed prior to debridement Primary Wound Dressing: Wound #1 Right,Plantar Foot: Aquacel Ag Other: - felt surrounding wound Secondary Dressing: Wound #1 Right,Plantar Foot: Dry Gauze Dressing Change Frequency: Wound #1 Right,Plantar Foot: Change dressing every other day. Follow-up Appointments: Wound #1 Right,Plantar Foot: Return Appointment in 1 week. Off-Loading: Wound #1 Right,Plantar Foot: Other: - crow boot Additional Orders / Instructions: Wound #1 Right,Plantar Foot: Other: - work on controlling blood sugars, work on losing weight, go back and see Dr Cleda Mccreedy, go to [employer] for [employer] adjustment We will continue with silver alginate and an appropriate offloading felt and she will continue using her current walking boot. I have again explained the rationale why she should get this off loading boot fixed and why she should see the podiatrist regarding her Charcot's foot.  She says as soon as she can get over her finances fixed she will work with these 2 departments. Electronic Signature(s) Signed: 02/28/2015 10:37:01 AM By: Christin Fudge MD, FACS Entered By: Christin Fudge on 02/28/2015 10:37:01 Julia Simmons ([id number]) -------------------------------------------------------------------------------- SuperBill Details Patient Name: Julia Shown D. Date of Service: 02/28/2015 Medical Record Number: [id number] Patient Account Number: [id number] Date of  Birth/Sex: Jul 12, 1960 (54 y.o. Female) Treating RN: Montey Hora Primary Care Physician: Glendon Axe Other Clinician: Referring Physician: Glendon Axe Treating Physician/Extender: Frann Rider in Treatment: 3 Diagnosis Coding ICD-10 Codes Code Description E11.621 Type 2 diabetes mellitus with foot ulcer 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t with fat layer exposed M14.671 Charcot's joint, right ankle and foot E66.01 Morbid (severe) obesity due to excess calories Facility Procedures CPT4 Code Description: [id number] - DEB SUBQ TISSUE 20 SQ CM/< ICD-10 Description Diagnosis E11.621 Type 2 diabetes mellitus with foot ulcer 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 M14.671 Charcot's joint, right ankle and foot  E66.01 Morbid (severe) obesity due to excess calories Modifier: t with fat la Quantity: 1 yer exposed Physician Procedures CPT4 Code Description: [id number] - WC PHYS SUBQ TISS 20 SQ CM ICD-10 Description Diagnosis E11.621 Type 2 diabetes mellitus with foot ulcer 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 M14.671 Charcot's joint, right ankle and foot  E66.01 Morbid (severe) obesity due to excess calories Modifier: t with fat lay Quantity: 1 er exposed Electronic Signature(s) Signed: 02/28/2015 10:37:22 AM By: Christin Fudge MD, FACS Entered By: Christin Fudge on 02/28/2015 10:37:22

## 2015-03-01 NOTE — Progress Notes (Signed)
Julia Simmons, Julia Simmons ([id number]) Visit Report for 02/28/2015 Arrival Information Details Patient Name: Julia Simmons, Julia Simmons. Date of Service: 02/28/2015 10:00 AM Medical Record Number: [id number] Patient Account Number: [id number] Date of Birth/Sex: 1960/12/04 (54 y.o. Female) Treating RN: Montey Hora Primary Care Physician: Glendon Axe Other Clinician: Referring Physician: Glendon Axe Treating Physician/Extender: Frann Rider in Treatment: 3 Visit Information History Since Last Visit Added or deleted any medications: No Patient Arrived: Ambulatory Any new allergies or adverse reactions: No Arrival Time: 10:01 Had a fall or experienced change in No Accompanied By: self activities of daily living that may affect Transfer Assistance: None risk of falls: Patient Identification Verified: Yes Signs or symptoms of abuse/neglect since last No Secondary Verification Process Yes visito Completed: Hospitalized since last visit: No Patient Has Alerts: Yes Pain Present Now: No Patient Alerts: DMII Electronic Signature(s) Signed: 02/28/2015 4:36:49 PM By: Montey Hora Entered By: Montey Hora on 02/28/2015 10:01:43 Julia Simmons ([id number]) -------------------------------------------------------------------------------- Encounter Discharge Information Details Patient Name: Julia Shown D. Date of Service: 02/28/2015 10:00 AM Medical Record Number: [id number] Patient Account Number: [id number] Date of Birth/Sex: 15-Jul-1960 (54 y.o. Female) Treating RN: Montey Hora Primary Care Physician: Glendon Axe Other Clinician: Referring Physician: Glendon Axe Treating Physician/Extender: Frann Rider in Treatment: 3 Encounter Discharge Information Items Discharge Pain Level: 0 Discharge Condition: Stable Ambulatory Status: Ambulatory Discharge Destination: Home Transportation: Private Auto Accompanied By: self Schedule Follow-up Appointment: Yes Medication  Reconciliation completed and provided to Patient/Care No Celie Desrochers: Provided on Clinical Summary of Care: 02/28/2015 Form Type Recipient Paper Patient [facility] Electronic Signature(s) Signed: 02/28/2015 10:35:22 AM By: Ruthine Dose Entered By: Ruthine Dose on 02/28/2015 10:35:22 Julia Simmons ([id number]) -------------------------------------------------------------------------------- Lower Extremity Assessment Details Patient Name: Julia Shown D. Date of Service: 02/28/2015 10:00 AM Medical Record Number: [id number] Patient Account Number: [id number] Date of Birth/Sex: Jan 27, 1961 (54 y.o. Female) Treating RN: Montey Hora Primary Care Physician: Glendon Axe Other Clinician: Referring Physician: Glendon Axe Treating Physician/Extender: Frann Rider in Treatment: 3 Vascular Assessment Pulses: Posterior Tibial Dorsalis Pedis Palpable: [Right:Yes] Extremity colors, hair growth, and conditions: Extremity Color: [Right:Normal] Hair Growth on Extremity: [Right:No] Temperature of Extremity: [Right:Warm] Capillary Refill: [Right:< 3 seconds] Toe Nail Assessment Left: Right: Thick: No Discolored: No Deformed: No Improper Length and Hygiene: No Electronic Signature(s) Signed: 02/28/2015 4:36:49 PM By: Montey Hora Entered By: Montey Hora on 02/28/2015 10:10:01 Julia Simmons ([id number]) -------------------------------------------------------------------------------- Multi Wound Chart Details Patient Name: Julia Shown D. Date of Service: 02/28/2015 10:00 AM Medical Record Number: [id number] Patient Account Number: [id number] Date of Birth/Sex: [birth year] (54 y.o. Female) Treating RN: Montey Hora Primary Care Physician: Glendon Axe Other Clinician: Referring Physician: Glendon Axe Treating Physician/Extender: Frann Rider in Treatment: 3 Vital Signs Height(in): 65 Pulse(bpm): 73 Weight(lbs): 336 Blood Pressure 167/72 (mmHg): Body Mass  Index(BMI): 56 Temperature(F): 97.9 Respiratory Rate 18 (breaths/min): Photos: [1:No Photos] [N/A:N/A] Wound Location: [1:Right Foot - Plantar] [N/A:N/A] Wounding Event: [1:Gradually Appeared] [N/A:N/A] Primary Etiology: [1:Diabetic Wound/Ulcer of the Lower Extremity] [N/A:N/A] Comorbid History: [1:Hypertension, Type II Diabetes, Neuropathy] [N/A:N/A] Date Acquired: [1:10/17/2014] [N/A:N/A] Weeks of Treatment: [1:3] [N/A:N/A] Wound Status: [1:Open] [N/A:N/A] Measurements L x W x D 0.9x1.1x0.2 [N/A:N/A] (cm) Area (cm) : [1:0.778] [N/A:N/A] Volume (cm) : [1:0.156] [N/A:N/A] % Reduction in Area: [1:60.70%] [N/A:N/A] % Reduction in Volume: 88.70% [N/A:N/A] Classification: [1:Grade 1] [N/A:N/A] Exudate Amount: [1:Medium] [N/A:N/A] Exudate Type: [1:Serous] [N/A:N/A] Exudate Color: [1:amber] [N/A:N/A] Wound Margin: [1:Flat and Intact] [N/A:N/A] Granulation Amount: [1:Large (67-100%)] [N/A:N/A] Granulation Quality: [1:Red] [N/A:N/A] Necrotic Amount: [1:None Present (0%)] [N/A:N/A]  Exposed Structures: [1:Fascia: No Fat: No Tendon: No Muscle: No Joint: No Bone: No] [N/A:N/A] Limited to Skin Breakdown Epithelialization: Small (1-33%) N/A N/A Periwound Skin Texture: Callus: Yes N/A N/A Edema: No Excoriation: No Induration: No Crepitus: No Fluctuance: No Friable: No Rash: No Scarring: No Periwound Skin Moist: Yes N/A N/A Moisture: Maceration: No Dry/Scaly: No Periwound Skin Color: Atrophie Blanche: No N/A N/A Cyanosis: No Ecchymosis: No Erythema: No Hemosiderin Staining: No Mottled: No Pallor: No Rubor: No Temperature: No Abnormality N/A N/A Tenderness on No N/A N/A Palpation: Wound Preparation: Ulcer Cleansing: N/A N/A Rinsed/Irrigated with Saline Topical Anesthetic Applied: Other: lidocaine 4% Treatment Notes Electronic Signature(s) Signed: 02/28/2015 4:36:49 PM By: Montey Hora Entered By: Montey Hora on 02/28/2015 10:10:16 Julia Simmons  ([id number]) -------------------------------------------------------------------------------- Multi-Disciplinary Care Plan Details Patient Name: Julia Shown D. Date of Service: 02/28/2015 10:00 AM Medical Record Number: [id number] Patient Account Number: [id number] Date of Birth/Sex: Apr 15, 1961 (54 y.o. Female) Treating RN: Montey Hora Primary Care Physician: Glendon Axe Other Clinician: Referring Physician: Glendon Axe Treating Physician/Extender: Frann Rider in Treatment: 3 Active Inactive Abuse / Safety / Falls / Self Care Management Nursing Diagnoses: Impaired physical mobility Potential for falls Goals: Patient will remain injury free Date Initiated: 02/07/2015 Goal Status: Active Interventions: Assess fall risk on admission and as needed Notes: Orientation to the Wound Care Program Nursing Diagnoses: Knowledge deficit related to the wound healing center program Goals: Patient/caregiver will verbalize understanding of the [place] Program Date Initiated: 02/07/2015 Goal Status: Active Interventions: Provide education on orientation to the wound center Notes: Peripheral Neuropathy Nursing Diagnoses: Potential alteration in peripheral tissue perfusion (select prior to confirmation of diagnosis) Goals: Patient/caregiver will verbalize understanding of disease process and disease management Julia Simmons, Julia Simmons ([id number]) Date Initiated: 02/07/2015 Goal Status: Active Interventions: Assess signs and symptoms of neuropathy upon admission and as needed Notes: Wound/Skin Impairment Nursing Diagnoses: Impaired tissue integrity Goals: Ulcer/skin breakdown will have a volume reduction of 30% by week 4 Date Initiated: 02/07/2015 Goal Status: Active Ulcer/skin breakdown will have a volume reduction of 50% by week 8 Date Initiated: 02/07/2015 Goal Status: Active Ulcer/skin breakdown will have a volume reduction of 80% by week 12 Date Initiated:  02/07/2015 Goal Status: Active Ulcer/skin breakdown will heal within 14 weeks Date Initiated: 02/07/2015 Goal Status: Active Interventions: Assess patient/caregiver ability to perform ulcer/skin care regimen upon admission and as needed Assess ulceration(s) every visit Notes: Electronic Signature(s) Signed: 02/28/2015 4:36:49 PM By: Montey Hora Entered By: Montey Hora on 02/28/2015 10:10:09 Julia Simmons ([id number]) -------------------------------------------------------------------------------- Patient/Caregiver Education Details Patient Name: Julia Shown D. Date of Service: 02/28/2015 10:00 AM Medical Record Number: [id number] Patient Account Number: [id number] Date of Birth/Gender: [birth year] (54 y.o. Female) Treating RN: Montey Hora Primary Care Physician: Glendon Axe Other Clinician: Referring Physician: Glendon Axe Treating Physician/Extender: Frann Rider in Treatment: 3 Education Assessment Education Provided To: Patient Education Topics Provided Wound/Skin Impairment: Handouts: Other: wound care and offloading Methods: Demonstration, Explain/Verbal Responses: State content correctly Electronic Signature(s) Signed: 02/28/2015 4:36:49 PM By: Montey Hora Entered By: Montey Hora on 02/28/2015 10:23:40 Julia Simmons ([id number]) -------------------------------------------------------------------------------- Wound Assessment Details Patient Name: Julia Shown D. Date of Service: 02/28/2015 10:00 AM Medical Record Number: [id number] Patient Account Number: [id number] Date of Birth/Sex: Jan 26, 1961 (54 y.o. Female) Treating RN: Montey Hora Primary Care Physician: Glendon Axe Other Clinician: Referring Physician: Glendon Axe Treating Physician/Extender: Frann Rider in Treatment: 3 Wound Status Wound Number: 1 Primary Diabetic Wound/Ulcer of the Lower Etiology: Extremity Wound Location: Right,  Plantar Foot Wound Status:  Open Wounding Event: Gradually Appeared Comorbid Hypertension, Type II Diabetes, Date Acquired: 10/17/2014 History: Neuropathy Weeks Of Treatment: 3 Clustered Wound: No Photos Photo Uploaded By: Montey Hora on 02/28/2015 16:33:29 Wound Measurements Length: (cm) 0.9 Width: (cm) 1.1 Depth: (cm) 0.3 Area: (cm) 0.778 Volume: (cm) 0.233 % Reduction in Area: 60.7% % Reduction in Volume: 83.2% Epithelialization: Small (1-33%) Tunneling: No Undermining: No Wound Description Classification: Grade 1 Wound Margin: Flat and Intact Exudate Amount: Medium Exudate Type: Serous Exudate Color: amber Foul Odor After Cleansing: No Wound Bed Granulation Amount: Large (67-100%) Exposed Structure Granulation Quality: Red Fascia Exposed: No Necrotic Amount: None Present (0%) Fat Layer Exposed: No Tendon Exposed: No Salmons, Azaryah D. ([id number]) Muscle Exposed: No Joint Exposed: No Bone Exposed: No Limited to Skin Breakdown Periwound Skin Texture Texture Color No Abnormalities Noted: No No Abnormalities Noted: No Callus: Yes Atrophie Blanche: No Crepitus: No Cyanosis: No Excoriation: No Ecchymosis: No Fluctuance: No Erythema: No Friable: No Hemosiderin Staining: No Induration: No Mottled: No Localized Edema: No Pallor: No Rash: No Rubor: No Scarring: No Temperature / Pain Moisture Temperature: No Abnormality No Abnormalities Noted: No Dry / Scaly: No Maceration: No Moist: Yes Wound Preparation Ulcer Cleansing: Rinsed/Irrigated with Saline Topical Anesthetic Applied: Other: lidocaine 4%, Treatment Notes Wound #1 (Right, Plantar Foot) 1. Cleansed with: Clean wound with Normal Saline 2. Anesthetic Topical Lidocaine 4% cream to wound bed prior to debridement 4. Dressing Applied: Aquacel Ag Other dressing (specify in notes) 7. Secured with Tape Notes felt around wound Electronic Signature(s) Signed: 02/28/2015 4:36:49 PM By: Montey Hora Entered By:  Montey Hora on 02/28/2015 10:11:37 Julia Simmons ([id number]) -------------------------------------------------------------------------------- Vitals Details Patient Name: Julia Shown D. Date of Service: 02/28/2015 10:00 AM Medical Record Number: [id number] Patient Account Number: [id number] Date of Birth/Sex: [birth year] (54 y.o. Female) Treating RN: Montey Hora Primary Care Physician: Glendon Axe Other Clinician: Referring Physician: Glendon Axe Treating Physician/Extender: Frann Rider in Treatment: 3 Vital Signs Time Taken: 10:01 Temperature (F): 97.9 Height (in): 65 Pulse (bpm): 73 Weight (lbs): 336 Respiratory Rate (breaths/min): 18 Body Mass Index (BMI): 55.9 Blood Pressure (mmHg): 167/72 Reference Range: 80 - 120 mg / dl Electronic Signature(s) Signed: 02/28/2015 4:36:49 PM By: Montey Hora Entered By: Montey Hora on 02/28/2015 10:04:04

## 2015-03-07 ENCOUNTER — Encounter: Payer: BLUE CROSS/BLUE SHIELD | Admitting: Surgery

## 2015-03-07 DIAGNOSIS — L97412 Non-pressure chronic ulcer of right heel and midfoot with fat layer exposed: Secondary | ICD-10-CM | POA: Diagnosis not present

## 2015-03-08 NOTE — Progress Notes (Signed)
Julia Simmons, Julia Simmons ([id number]) Visit Report for 03/07/2015 Chief Complaint Document Details Patient Name: Julia Simmons. Date of Service: 03/07/2015 8:45 AM Medical Record Number: [id number] Patient Account Number: [id number] Date of Birth/Sex: [birth year] (54 y.o. Female) Treating RN: Cornell Barman Primary Care Physician: Glendon Axe Other Clinician: Referring Physician: Glendon Axe Treating Physician/Extender: Frann Rider in Treatment: 4 Information Obtained from: Patient Chief Complaint Patients presents for treatment of an open diabetic ulcer. 54 year old patient whose had a ulcerated area on her right midfoot for about 3 months. Electronic Signature(s) Signed: 03/07/2015 9:10:07 AM By: Christin Fudge MD, FACS Entered By: Christin Fudge on 03/07/2015 09:10:07 Julia Simmons ([id number]) -------------------------------------------------------------------------------- Debridement Details Patient Name: Julia Simmons. Date of Service: 03/07/2015 8:45 AM Medical Record Number: [id number] Patient Account Number: [id number] Date of Birth/Sex: [birth year] (54 y.o. Female) Treating RN: Cornell Barman Primary Care Physician: Glendon Axe Other Clinician: Referring Physician: Glendon Axe Treating Physician/Extender: Frann Rider in Treatment: 4 Debridement Performed for Wound #1 Right,Plantar Foot Assessment: Performed By: Physician Pat Patrick., MD Debridement: Debridement Pre-procedure Yes Verification/Time Out Taken: Start Time: 09:03 Pain Control: Lidocaine 4% Topical Solution Level: Skin/Subcutaneous Tissue Total Area Debrided (L x 1.1 (cm) x 0.9 (cm) = 0.99 (cm) W): Tissue and other Viable, Non-Viable, Exudate, Fibrin/Slough, Subcutaneous material debrided: Instrument: Curette Bleeding: Minimum Hemostasis Achieved: Pressure End Time: 09:05 Procedural Pain: 0 Post Procedural Pain: 0 Response to Treatment: Procedure was tolerated well Post  Debridement Measurements of Total Wound Length: (cm) 1.1 Width: (cm) 1.1 Depth: (cm) 0.5 Volume: (cm) 0.475 Post Procedure Diagnosis Same as Pre-procedure Electronic Signature(s) Signed: 03/07/2015 9:10:01 AM By: Christin Fudge MD, FACS Signed: 03/07/2015 4:49:24 PM By: Gretta Cool RN, BSN, Kim RN, BSN Entered By: Christin Fudge on 03/07/2015 09:10:01 Julia Simmons ([id number]) -------------------------------------------------------------------------------- HPI Details Patient Name: Julia Simmons. Date of Service: 03/07/2015 8:45 AM Medical Record Number: [id number] Patient Account Number: [id number] Date of Birth/Sex: 04-04-1961 (54 y.o. Female) Treating RN: Cornell Barman Primary Care Physician: Glendon Axe Other Clinician: Referring Physician: Glendon Axe Treating Physician/Extender: Frann Rider in Treatment: 4 History of Present Illness Location: ulcerated area right midfoot Quality: Patient reports No Pain. Severity: Patient states wound are getting worse. Duration: Patient has had the wound for > 3 months prior to seeking treatment at the wound center Context: The wound appeared gradually over time Modifying Factors: Other treatment(s) tried include: She sees Dr. Cleda Mccreedy for her podiatry, she sees a orthopedic doctor at [facility] and he had prescribed a Crow walking boot. Associated Signs and Symptoms: Patient reports having difficulty standing for long periods. HPI Description: 54 year old patient who comes regarding her right lower extremity with an open wound and history of Charcot's arthropathy for which she sees a podiatrist regularly and has been wearing a Crow walker. She has been seeing Dr. Cleda Mccreedy in the podiatry office.She was recently seen at the [place] office and given a gram of vancomycin on 01/28/2015. Her PCP is Dr. Glendon Axe who saw her in July and noted that the patient has been noncompliant with her diabetic care. Her hemoglobin A1c improved from  10.2 in May to 8.1 on recent labs sheet. The patient's past medical history significant for diabetes mellitus type 2, hypertension, hyperlipidemia, obesity, Charcot foot due to diabetes mellitus and is status post cholecystectomy and cesarean section.during her last visit the Levemir medication dose was increased and she was also on NovoLog, Actos, Glucophage 02/22/2015 -- recent X-ray of the right foot shows diffuse soft tissue swelling, diffuse  osteopenia and degenerative changes. Deformity of the first second and third metatarsals consistent with old trauma and/ or infection with healing. No definite acute bony destructive changes are noted. If osteomyelitis is a clinical concern an MRI has been recommended. She has not yet scheduled/received her appointment from Dr. Cleda Mccreedy and I have asked her to keep this. 02/28/2015 -- for financial reasons she has not yet seen Dr. Cleda Mccreedy not she got her Milinda Cave boot fixed appropriately by either [facility] or the prosthetic department there. 03/07/2015 -- the patient did see Dr. Cleda Mccreedy on Friday and he was pleased with her progress and wanted her to continue with local wound care and the [facility] walking boot. He said she should wear this for a while and see her back on a regular basis. No official notes are available. Electronic Signature(s) Signed: 03/07/2015 9:10:57 AM By: Christin Fudge MD, FACS Entered By: Christin Fudge on 03/07/2015 09:10:57 Julia Simmons ([id number]) -------------------------------------------------------------------------------- Physical Exam Details Patient Name: Julia Simmons. Date of Service: 03/07/2015 8:45 AM Medical Record Number: [id number] Patient Account Number: [id number] Date of Birth/Sex: [birth year] (54 y.o. Female) Treating RN: Cornell Barman Primary Care Physician: Glendon Axe Other Clinician: Referring Physician: Glendon Axe Treating Physician/Extender: Frann Rider in Treatment: 4 Constitutional . Pulse regular.  Respirations normal and unlabored. Afebrile. . Eyes Nonicteric. Reactive to light. Ears, Nose, Mouth, and Throat Lips, teeth, and gums WNL.Marland Kitchen Moist mucosa without lesions . Neck supple and nontender. No palpable supraclavicular or cervical adenopathy. Normal sized without goiter. Respiratory WNL. No retractions.. Cardiovascular Pedal Pulses WNL. No clubbing, cyanosis or edema. Chest Breasts symmetical and no nipple discharge.. Breast tissue WNL, no masses, lumps, or tenderness.. Lymphatic No adneopathy. No adenopathy. No adenopathy. Musculoskeletal Adexa without tenderness or enlargement.. Digits and nails w/o clubbing, cyanosis, infection, petechiae, ischemia, or inflammatory conditions.. Integumentary (Hair, Skin) No suspicious lesions. No crepitus or fluctuance. No peri-wound warmth or erythema. No masses.Marland Kitchen Psychiatric Judgement and insight Intact.. No evidence of depression, anxiety, or agitation.. Notes Sharp dissection was done with a curette and all the fibrofatty tissue and slough which needed debridement has been done. The wound edges are supple and there is no callus Electronic Signature(s) Signed: 03/07/2015 9:11:27 AM By: Christin Fudge MD, FACS Entered By: Christin Fudge on 03/07/2015 09:11:27 Julia Simmons ([id number]) -------------------------------------------------------------------------------- Physician Orders Details Patient Name: Julia Simmons. Date of Service: 03/07/2015 8:45 AM Medical Record Number: [id number] Patient Account Number: [id number] Date of Birth/Sex: [birth year] (54 y.o. Female) Treating RN: Cornell Barman Primary Care Physician: Glendon Axe Other Clinician: Referring Physician: Glendon Axe Treating Physician/Extender: Frann Rider in Treatment: 4 Verbal / Phone Orders: Yes Clinician: Cornell Barman Read Back and Verified: Yes Diagnosis Coding Wound Cleansing Wound #1 Right,Plantar Foot o Clean wound with Normal  Saline. Anesthetic Wound #1 Right,Plantar Foot o Topical Lidocaine 4% cream applied to wound bed prior to debridement Primary Wound Dressing Wound #1 Right,Plantar Foot o Aquacel Ag o Other: - felt surrounding wound Secondary Dressing Wound #1 Right,Plantar Foot o Dry Gauze Dressing Change Frequency Wound #1 Right,Plantar Foot o Change dressing every other day. Follow-up Appointments Wound #1 Right,Plantar Foot o Return Appointment in 1 week. Off-Loading Wound #1 Right,Plantar Foot o Other: - crow boot Additional Orders / Instructions Wound #1 Right,Plantar Foot o Other: - work on controlling blood sugars, work on losing weight, go back and see Dr Cleda Mccreedy, go to [employer] for [employer] SARIT, SPARANO ([id number]) Electronic Signature(s) Signed: 03/07/2015 3:58:37 PM By: Christin Fudge MD,  FACS Signed: 03/07/2015 4:49:24 PM By: Gretta Cool, RN, BSN, Kim RN, BSN Entered By: Gretta Cool, RN, BSN, Kim on 03/07/2015 09:05:25 Julia Simmons ([id number]) -------------------------------------------------------------------------------- Problem List Details Patient Name: ASTIN, RAPE. Date of Service: 03/07/2015 8:45 AM Medical Record Number: [id number] Patient Account Number: [id number] Date of Birth/Sex: 26-Mar-1961 (54 y.o. Female) Treating RN: Cornell Barman Primary Care Physician: Glendon Axe Other Clinician: Referring Physician: Glendon Axe Treating Physician/Extender: Frann Rider in Treatment: 4 Active Problems ICD-10 Encounter Code Description Active Date Diagnosis E11.621 Type 2 diabetes mellitus with foot ulcer 02/07/2015 Yes 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t with 02/07/2015 Yes fat layer exposed M14.671 Charcot's joint, right ankle and foot 02/07/2015 Yes E66.01 Morbid (severe) obesity due to excess calories 02/07/2015 Yes Inactive Problems Resolved Problems Electronic Signature(s) Signed: 03/07/2015 9:09:54 AM By:  Christin Fudge MD, FACS Entered By: Christin Fudge on 03/07/2015 09:09:54 Julia Simmons ([id number]) -------------------------------------------------------------------------------- Progress Note Details Patient Name: Julia Simmons. Date of Service: 03/07/2015 8:45 AM Medical Record Number: [id number] Patient Account Number: [id number] Date of Birth/Sex: [birth year] (54 y.o. Female) Treating RN: Cornell Barman Primary Care Physician: Glendon Axe Other Clinician: Referring Physician: Glendon Axe Treating Physician/Extender: Frann Rider in Treatment: 4 Subjective Chief Complaint Information obtained from Patient Patients presents for treatment of an open diabetic ulcer. 54 year old patient whose had a ulcerated area on her right midfoot for about 3 months. History of Present Illness (HPI) The following HPI elements were documented for the patient's wound: Location: ulcerated area right midfoot Quality: Patient reports No Pain. Severity: Patient states wound are getting worse. Duration: Patient has had the wound for > 3 months prior to seeking treatment at the wound center Context: The wound appeared gradually over time Modifying Factors: Other treatment(s) tried include: She sees Dr. Cleda Mccreedy for her podiatry, she sees a orthopedic doctor at [facility] and he had prescribed a Crow walking boot. Associated Signs and Symptoms: Patient reports having difficulty standing for long periods. 54 year old patient who comes regarding her right lower extremity with an open wound and history of Charcot's arthropathy for which she sees a podiatrist regularly and has been wearing a Crow walker. She has been seeing Dr. Cleda Mccreedy in the podiatry office.She was recently seen at the [place] office and given a gram of vancomycin on 01/28/2015. Her PCP is Dr. Glendon Axe who saw her in July and noted that the patient has been noncompliant with her diabetic care. Her hemoglobin A1c improved from  10.2 in May to 8.1 on recent labs sheet. The patient's past medical history significant for diabetes mellitus type 2, hypertension, hyperlipidemia, obesity, Charcot foot due to diabetes mellitus and is status post cholecystectomy and cesarean section.during her last visit the Levemir medication dose was increased and she was also on NovoLog, Actos, Glucophage 02/22/2015 -- recent X-ray of the right foot shows diffuse soft tissue swelling, diffuse osteopenia and degenerative changes. Deformity of the first second and third metatarsals consistent with old trauma and/ or infection with healing. No definite acute bony destructive changes are noted. If osteomyelitis is a clinical concern an MRI has been recommended. She has not yet scheduled/received her appointment from Dr. Cleda Mccreedy and I have asked her to keep this. 02/28/2015 -- for financial reasons she has not yet seen Dr. Cleda Mccreedy not she got her Milinda Cave boot fixed appropriately by either [facility] or the prosthetic department there. 03/07/2015 -- the patient did see Dr. Cleda Mccreedy on Friday and he was pleased with her progress and wanted her  to continue with local wound care and the [facility] walking boot. He said she should wear this for a while and see her back on a regular basis. No official notes are available. Julia Simmons, Julia Simmons ([id number]) Objective Constitutional Pulse regular. Respirations normal and unlabored. Afebrile. Vitals Time Taken: 8:49 AM, Height: 65 in, Weight: 336 lbs, BMI: 55.9, Temperature: 98.0 F, Pulse: 71 bpm, Respiratory Rate: 20 breaths/min, Blood Pressure: 154/72 mmHg. Eyes Nonicteric. Reactive to light. Ears, Nose, Mouth, and Throat Lips, teeth, and gums WNL.Marland Kitchen Moist mucosa without lesions . Neck supple and nontender. No palpable supraclavicular or cervical adenopathy. Normal sized without goiter. Respiratory WNL. No retractions.. Cardiovascular Pedal Pulses WNL. No clubbing, cyanosis or edema. Chest Breasts symmetical and no  nipple discharge.. Breast tissue WNL, no masses, lumps, or tenderness.. Lymphatic No adneopathy. No adenopathy. No adenopathy. Musculoskeletal Adexa without tenderness or enlargement.. Digits and nails w/o clubbing, cyanosis, infection, petechiae, ischemia, or inflammatory conditions.Marland Kitchen Psychiatric Judgement and insight Intact.. No evidence of depression, anxiety, or agitation.. General Notes: Sharp dissection was done with a curette and all the fibrofatty tissue and slough which needed debridement has been done. The wound edges are supple and there is no callus Integumentary (Hair, Skin) No suspicious lesions. No crepitus or fluctuance. No peri-wound warmth or erythema. No masses.Marland Kitchen Julia Simmons, Julia Simmons ([id number]) Wound #1 status is Open. Original cause of wound was Gradually Appeared. The wound is located on the [place]. The wound measures 1.1cm length x 0.9cm width x 0.5cm depth; 0.778cm^2 area and 0.389cm^3 volume. The wound is limited to skin breakdown. There is no tunneling or undermining noted. There is a medium amount of serous drainage noted. The wound margin is flat and intact. There is large (67-100%) pink granulation within the wound bed. There is a small (1-33%) amount of necrotic tissue within the wound bed including Adherent Slough. The periwound skin appearance exhibited: Localized Edema, Moist. The periwound skin appearance did not exhibit: Callus, Crepitus, Excoriation, Fluctuance, Friable, Induration, Rash, Scarring, Dry/Scaly, Maceration, Atrophie Blanche, Cyanosis, Ecchymosis, Hemosiderin Staining, Mottled, Pallor, Rubor, Erythema. Periwound temperature was noted as No Abnormality. Assessment Active Problems ICD-10 E11.621 - Type 2 diabetes mellitus with foot ulcer L97.412 - Non-pressure chronic ulcer of right heel and midfoot with fat layer exposed M14.671 - Charcot's joint, right ankle and foot E66.01 - Morbid (severe) obesity due to excess calories I have  recommended we continue with silver alginate and offloading felt. She will continue to walk with her crow walking boot. We will apply for insurance clearance for [employer] and when she is ready we may have this as an option. She will continue to offload and see me back next week. Procedures Wound #1 Wound #1 is a Diabetic Wound/Ulcer of the Lower Extremity located on the [place] . There was a Skin/Subcutaneous Tissue Debridement ([id number]) debridement with total area of 0.99 sq cm performed by Pat Patrick., MD. with the following instrument(s): Curette to remove Viable and Non-Viable tissue/material including Exudate, Fibrin/Slough, and Subcutaneous after achieving pain control using Lidocaine 4% Topical Solution. A time out was conducted prior to the start of the procedure. A Minimum amount of bleeding was controlled with Pressure. The procedure was tolerated well with a pain level of 0 throughout and a pain level of 0 following the procedure. Post Debridement Measurements: 1.1cm length x 1.1cm width x 0.5cm depth; 0.475cm^3 volume. Post procedure Diagnosis Wound #1: Same as Pre-Procedure Julia Simmons, Julia Simmons. ([id number]) Plan Wound Cleansing: Wound #1 Right,Plantar Foot: Clean wound with Normal  Saline. Anesthetic: Wound #1 Right,Plantar Foot: Topical Lidocaine 4% cream applied to wound bed prior to debridement Primary Wound Dressing: Wound #1 Right,Plantar Foot: Aquacel Ag Other: - felt surrounding wound Secondary Dressing: Wound #1 Right,Plantar Foot: Dry Gauze Dressing Change Frequency: Wound #1 Right,Plantar Foot: Change dressing every other day. Follow-up Appointments: Wound #1 Right,Plantar Foot: Return Appointment in 1 week. Off-Loading: Wound #1 Right,Plantar Foot: Other: - crow boot Additional Orders / Instructions: Wound #1 Right,Plantar Foot: Other: - work on controlling blood sugars, work on losing weight, go back and see Dr Cleda Mccreedy, go to [employer] for  [employer] adjustment I have recommended we continue with silver alginate and offloading felt. She will continue to walk with her crow walking boot. We will apply for insurance clearance for [employer] and when she is ready we may have this as an option. She will continue to offload and see me back next week. Electronic Signature(s) Signed: 03/07/2015 9:12:30 AM By: Christin Fudge MD, FACS Entered By: Christin Fudge on 03/07/2015 09:12:30 Julia Simmons, Julia Simmons ([id number] Cooley, Ander Gaster ([id number]) -------------------------------------------------------------------------------- SuperBill Details Patient Name: Julia Simmons. Date of Service: 03/07/2015 Medical Record Number: [id number] Patient Account Number: [id number] Date of Birth/Sex: Oct 20, 1960 (54 y.o. Female) Treating RN: Cornell Barman Primary Care Physician: Glendon Axe Other Clinician: Referring Physician: Glendon Axe Treating Physician/Extender: Frann Rider in Treatment: 4 Diagnosis Coding ICD-10 Codes Code Description E11.621 Type 2 diabetes mellitus with foot ulcer 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t with fat layer exposed M14.671 Charcot's joint, right ankle and foot E66.01 Morbid (severe) obesity due to excess calories Facility Procedures CPT4 Code Description: [id number] 11042 - DEB SUBQ TISSUE 20 SQ CM/< ICD-10 Description Diagnosis E11.621 Type 2 diabetes mellitus with foot ulcer 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 M14.671 Charcot's joint, right ankle and foot Modifier: t with fat la Quantity: 1 yer exposed Physician Procedures CPT4 Code Description: [id number] - WC PHYS SUBQ TISS 20 SQ CM ICD-10 Description Diagnosis E11.621 Type 2 diabetes mellitus with foot ulcer 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 M14.671 Charcot's joint, right ankle and foot Modifier: t with fat lay Quantity: 1 er exposed Electronic Signature(s) Signed: 03/07/2015  9:12:43 AM By: Christin Fudge MD, FACS Entered By: Christin Fudge on 03/07/2015 09:12:43

## 2015-03-14 ENCOUNTER — Ambulatory Visit: Payer: BLUE CROSS/BLUE SHIELD | Admitting: Surgery

## 2015-03-22 NOTE — Progress Notes (Addendum)
SKYLEE, BAIRD ([id number]) Visit Report for 03/07/2015 Arrival Information Details Patient Name: Julia Simmons, WANEK. Date of Service: 03/07/2015 8:45 AM Medical Record Number: [id number] Patient Account Number: [id number] Date of Birth/Sex: 1960-06-25 (54 y.o. Female) Treating RN: Junious Dresser Primary Care Physician: Glendon Axe Other Clinician: Referring Physician: Glendon Axe Treating Physician/Extender: Frann Rider in Treatment: 4 Visit Information History Since Last Visit Added or deleted any medications: No Patient Arrived: Ambulatory Any new allergies or adverse reactions: No Arrival Time: 08:48 Had a fall or experienced change in No Accompanied By: self activities of daily living that may affect Transfer Assistance: None risk of falls: Patient Identification Verified: Yes Signs or symptoms of abuse/neglect since last No Secondary Verification Process Yes visito Completed: Hospitalized since last visit: No Patient Has Alerts: Yes Has Dressing in Place as Prescribed: Yes Patient Alerts: DMII Has Footwear/Offloading in Place as Yes Prescribed: Pain Present Now: No Electronic Signature(s) Signed: 03/21/2015 4:32:11 PM By: Junious Dresser RN Entered By: Junious Dresser on 03/07/2015 08:49:23 Julia Simmons ([id number]) -------------------------------------------------------------------------------- Encounter Discharge Information Details Patient Name: Julia Shown D. Date of Service: 03/07/2015 8:45 AM Medical Record Number: [id number] Patient Account Number: [id number] Date of Birth/Sex: 1960-08-18 (54 y.o. Female) Treating RN: Junious Dresser Primary Care Physician: Glendon Axe Other Clinician: Referring Physician: Glendon Axe Treating Physician/Extender: Frann Rider in Treatment: 4 Encounter Discharge Information Items Discharge Pain Level: 0 Discharge Condition: Stable Ambulatory Status: Ambulatory Discharge Destination:  Home Private Transportation: Auto Accompanied By: self Schedule Follow-up Appointment: Yes Medication Reconciliation completed and No provided to Patient/Care Provider: Clinical Summary of Care: Electronic Signature(s) Signed: 03/21/2015 4:32:11 PM By: Junious Dresser RN Entered By: Junious Dresser on 03/07/2015 09:20:06 Julia Simmons ([id number]) -------------------------------------------------------------------------------- Lower Extremity Assessment Details Patient Name: Julia Shown D. Date of Service: 03/07/2015 8:45 AM Medical Record Number: [id number] Patient Account Number: [id number] Date of Birth/Sex: 10-Jan-1961 (54 y.o. Female) Treating RN: Junious Dresser Primary Care Physician: Glendon Axe Other Clinician: Referring Physician: Glendon Axe Treating Physician/Extender: Frann Rider in Treatment: 4 Vascular Assessment Claudication: Claudication Assessment [Right:None] Pulses: Posterior Tibial Palpable: [Right:Yes] Dorsalis Pedis Palpable: [Right:Yes] Extremity colors, hair growth, and conditions: Extremity Color: [Right:Normal] Hair Growth on Extremity: [Right:No] Temperature of Extremity: [Right:Warm] Capillary Refill: [Right:< 3 seconds] Dependent Rubor: [Right:No] Blanched when Elevated: [Right:No] Toe Nail Assessment Left: Right: Deformed: No Improper Length and Hygiene: No Electronic Signature(s) Signed: 03/21/2015 4:32:11 PM By: Junious Dresser RN Entered By: Junious Dresser on 03/07/2015 08:57:05 Julia Simmons ([id number]) -------------------------------------------------------------------------------- Multi Wound Chart Details Patient Name: Julia Shown D. Date of Service: 03/07/2015 8:45 AM Medical Record Number: [id number] Patient Account Number: [id number] Date of Birth/Sex: 02-10-1961 (54 y.o. Female) Treating RN: Cornell Barman Primary Care Physician: Glendon Axe Other Clinician: Referring Physician: Glendon Axe Treating  Physician/Extender: Frann Rider in Treatment: 4 Vital Signs Height(in): 65 Pulse(bpm): 71 Weight(lbs): 336 Blood Pressure 154/72 (mmHg): Body Mass Index(BMI): 56 Temperature(F): 98.0 Respiratory Rate 20 (breaths/min): Photos: [1:No Photos] [N/A:N/A] Wound Location: [1:Right Foot - Plantar] [N/A:N/A] Wounding Event: [1:Gradually Appeared] [N/A:N/A] Primary Etiology: [1:Diabetic Wound/Ulcer of the Lower Extremity] [N/A:N/A] Comorbid History: [1:Hypertension, Type II Diabetes, Neuropathy] [N/A:N/A] Date Acquired: [1:10/17/2014] [N/A:N/A] Weeks of Treatment: [1:4] [N/A:N/A] Wound Status: [1:Open] [N/A:N/A] Measurements L x W x D 1.1x0.9x0.5 [N/A:N/A] (cm) Area (cm) : [1:0.778] [N/A:N/A] Volume (cm) : [1:0.389] [N/A:N/A] % Reduction in Area: [1:60.70%] [N/A:N/A] % Reduction in Volume: 71.90% [N/A:N/A] Classification: [1:Grade 1] [N/A:N/A] Exudate Amount: [1:Medium] [N/A:N/A] Exudate Type: [1:Serous] [N/A:N/A] Exudate Color: [1:amber] [N/A:N/A] Wound Margin: [1:Flat and  Intact] [N/A:N/A] Granulation Amount: [1:Large (67-100%)] [N/A:N/A] Granulation Quality: [1:Pink] [N/A:N/A] Necrotic Amount: [1:Small (1-33%)] [N/A:N/A] Exposed Structures: [1:Fascia: No Fat: No Tendon: No Muscle: No Joint: No Bone: No] [N/A:N/A] Limited to Skin Breakdown Epithelialization: Small (1-33%) N/A N/A Periwound Skin Texture: Edema: Yes N/A N/A Excoriation: No Induration: No Callus: No Crepitus: No Fluctuance: No Friable: No Rash: No Scarring: No Periwound Skin Moist: Yes N/A N/A Moisture: Maceration: No Dry/Scaly: No Periwound Skin Color: Atrophie Blanche: No N/A N/A Cyanosis: No Ecchymosis: No Erythema: No Hemosiderin Staining: No Mottled: No Pallor: No Rubor: No Temperature: No Abnormality N/A N/A Tenderness on No N/A N/A Palpation: Wound Preparation: Ulcer Cleansing: N/A N/A Rinsed/Irrigated with Saline Topical Anesthetic Applied: Other: lidocaine 4% Treatment  Notes Electronic Signature(s) Signed: 03/07/2015 4:49:24 PM By: Gretta Cool, RN, BSN, Kim RN, BSN Entered By: Gretta Cool, RN, BSN, Kim on 03/07/2015 09:02:02 Julia Simmons ([id number]) -------------------------------------------------------------------------------- Multi-Disciplinary Care Plan Details Patient Name: Julia Simmons, PERELLA. Date of Service: 03/07/2015 8:45 AM Medical Record Number: [id number] Patient Account Number: [id number] Date of Birth/Sex: 1961-03-29 (54 y.o. Female) Treating RN: Cornell Barman Primary Care Physician: Glendon Axe Other Clinician: Referring Physician: Glendon Axe Treating Physician/Extender: Frann Rider in Treatment: 4 Active Inactive Electronic Signature(s) Signed: 03/28/2015 6:16:01 PM By: Gretta Cool, RN, BSN, Kim RN, BSN Previous Signature: 03/07/2015 4:49:24 PM Version By: Gretta Cool RN, BSN, Kim RN, BSN Entered By: Gretta Cool, RN, BSN, Kim on 03/22/2015 11:10:51 Julia Simmons ([id number]) -------------------------------------------------------------------------------- Patient/Caregiver Education Details Patient Name: Julia Simmons. Date of Service: 03/07/2015 8:45 AM Medical Record Number: [id number] Patient Account Number: [id number] Date of Birth/Gender: Aug 01, 1960 (54 y.o. Female) Treating RN: Junious Dresser Primary Care Physician: Glendon Axe Other Clinician: Referring Physician: Glendon Axe Treating Physician/Extender: Frann Rider in Treatment: 4 Education Assessment Education Provided To: Patient Education Topics Provided Infection: Methods: Demonstration Responses: State content correctly Offloading: Methods: Explain/Verbal Responses: State content correctly Pressure: Methods: Explain/Verbal Responses: State content correctly Wound Debridement: Methods: Explain/Verbal Responses: State content correctly Wound/Skin Impairment: Methods: Explain/Verbal Responses: State content correctly Electronic Signature(s) Signed:  03/21/2015 4:32:11 PM By: Junious Dresser RN Entered By: Junious Dresser on 03/07/2015 09:20:43 Julia Simmons ([id number]) -------------------------------------------------------------------------------- Wound Assessment Details Patient Name: Julia Shown D. Date of Service: 03/07/2015 8:45 AM Medical Record Number: [id number] Patient Account Number: [id number] Date of Birth/Sex: 1960-12-14 (54 y.o. Female) Treating RN: Junious Dresser Primary Care Physician: Glendon Axe Other Clinician: Referring Physician: Glendon Axe Treating Physician/Extender: Frann Rider in Treatment: 4 Wound Status Wound Number: 1 Primary Diabetic Wound/Ulcer of the Lower Etiology: Extremity Wound Location: Right Foot - Plantar Wound Status: Open Wounding Event: Gradually Appeared Comorbid Hypertension, Type II Diabetes, Date Acquired: 10/17/2014 History: Neuropathy Weeks Of Treatment: 4 Clustered Wound: No Photos Photo Uploaded By: Gretta Cool, RN, BSN, Kim on 03/07/2015 15:09:32 Wound Measurements Length: (cm) 1.1 Width: (cm) 0.9 Depth: (cm) 0.5 Area: (cm) 0.778 Volume: (cm) 0.389 % Reduction in Area: 60.7% % Reduction in Volume: 71.9% Epithelialization: Small (1-33%) Tunneling: No Undermining: No Wound Description Classification: Grade 1 Wound Margin: Flat and Intact Exudate Amount: Medium Exudate Type: Serous Exudate Color: amber Julia Simmons, SALTS[place]. ([id number]) Foul Odor After Cleansing: No Wound Bed Granulation Amount: Large (67-100%) Exposed Structure Granulation Quality: Pink Fascia Exposed: No Necrotic Amount: Small (1-33%) Fat Layer Exposed: No Necrotic Quality: Adherent Slough Tendon Exposed: No Muscle Exposed: No Joint Exposed: No Bone Exposed: No Limited to Skin Breakdown Periwound Skin Texture Texture Color No Abnormalities Noted: No No Abnormalities Noted: No Callus: No Atrophie Blanche: No Crepitus: No Cyanosis:  No Excoriation: No Ecchymosis: No Fluctuance:  No Erythema: No Friable: No Hemosiderin Staining: No Induration: No Mottled: No Localized Edema: Yes Pallor: No Rash: No Rubor: No Scarring: No Temperature / Pain Moisture Temperature: No Abnormality No Abnormalities Noted: No Dry / Scaly: No Maceration: No Moist: Yes Wound Preparation Ulcer Cleansing: Rinsed/Irrigated with Saline Topical Anesthetic Applied: Other: lidocaine 4%, Electronic Signature(s) Signed: 03/21/2015 4:32:11 PM By: Junious Dresser RN Entered By: Junious Dresser on 03/07/2015 08:58:48 Julia Simmons ([id number]) -------------------------------------------------------------------------------- Vitals Details Patient Name: Julia Shown D. Date of Service: 03/07/2015 8:45 AM Medical Record Number: [id number] Patient Account Number: [id number] Date of Birth/Sex: 1960/08/30 (54 y.o. Female) Treating RN: Junious Dresser Primary Care Physician: Glendon Axe Other Clinician: Referring Physician: Glendon Axe Treating Physician/Extender: Frann Rider in Treatment: 4 Vital Signs Time Taken: 08:49 Temperature (F): 98.0 Height (in): 65 Pulse (bpm): 71 Weight (lbs): 336 Respiratory Rate (breaths/min): 20 Body Mass Index (BMI): 55.9 Blood Pressure (mmHg): 154/72 Reference Range: 80 - 120 mg / dl Electronic Signature(s) Signed: 03/21/2015 4:32:11 PM By: Junious Dresser RN Entered By: Junious Dresser on 03/07/2015 08:52:10

## 2015-12-30 ENCOUNTER — Other Ambulatory Visit: Payer: Self-pay | Admitting: Internal Medicine

## 2015-12-30 DIAGNOSIS — Z1239 Encounter for other screening for malignant neoplasm of breast: Secondary | ICD-10-CM

## 2016-01-17 ENCOUNTER — Ambulatory Visit: Payer: BLUE CROSS/BLUE SHIELD

## 2016-02-03 ENCOUNTER — Ambulatory Visit
Admission: RE | Admit: 2016-02-03 | Discharge: 2016-02-03 | Disposition: A | Discharge status: Home / Self Care | Payer: Medicare Other | Source: Ambulatory Visit | Attending: Internal Medicine | Admitting: Internal Medicine

## 2016-02-03 ENCOUNTER — Other Ambulatory Visit: Payer: Self-pay | Admitting: Internal Medicine

## 2016-02-03 DIAGNOSIS — Z1231 Encounter for screening mammogram for malignant neoplasm of breast: Secondary | ICD-10-CM | POA: Insufficient documentation

## 2016-02-03 DIAGNOSIS — Z1239 Encounter for other screening for malignant neoplasm of breast: Secondary | ICD-10-CM

## 2016-02-07 ENCOUNTER — Other Ambulatory Visit: Payer: Self-pay | Admitting: Internal Medicine

## 2016-02-07 DIAGNOSIS — N6310 Unspecified lump in the right breast, unspecified quadrant: Secondary | ICD-10-CM

## 2016-02-17 ENCOUNTER — Ambulatory Visit
Admission: RE | Admit: 2016-02-17 | Discharge: 2016-02-17 | Disposition: A | Discharge status: Home / Self Care | Payer: Medicare Other | Source: Ambulatory Visit | Attending: Internal Medicine | Admitting: Internal Medicine

## 2016-02-17 DIAGNOSIS — N6310 Unspecified lump in the right breast, unspecified quadrant: Secondary | ICD-10-CM

## 2016-02-17 DIAGNOSIS — N63 Unspecified lump in breast: Secondary | ICD-10-CM | POA: Diagnosis not present

## 2016-07-02 DIAGNOSIS — E113213 Type 2 diabetes mellitus with mild nonproliferative diabetic retinopathy with macular edema, bilateral: Secondary | ICD-10-CM | POA: Diagnosis not present

## 2016-07-02 DIAGNOSIS — H2511 Age-related nuclear cataract, right eye: Secondary | ICD-10-CM | POA: Diagnosis not present

## 2016-07-02 DIAGNOSIS — E11319 Type 2 diabetes mellitus with unspecified diabetic retinopathy without macular edema: Secondary | ICD-10-CM | POA: Diagnosis not present

## 2016-07-02 DIAGNOSIS — E11311 Type 2 diabetes mellitus with unspecified diabetic retinopathy with macular edema: Secondary | ICD-10-CM | POA: Diagnosis not present

## 2016-07-02 DIAGNOSIS — Z961 Presence of intraocular lens: Secondary | ICD-10-CM | POA: Diagnosis not present

## 2016-07-12 DIAGNOSIS — E11311 Type 2 diabetes mellitus with unspecified diabetic retinopathy with macular edema: Secondary | ICD-10-CM | POA: Diagnosis not present

## 2016-08-20 DIAGNOSIS — E11319 Type 2 diabetes mellitus with unspecified diabetic retinopathy without macular edema: Secondary | ICD-10-CM | POA: Diagnosis not present

## 2016-08-20 DIAGNOSIS — E11311 Type 2 diabetes mellitus with unspecified diabetic retinopathy with macular edema: Secondary | ICD-10-CM | POA: Diagnosis not present

## 2016-08-20 DIAGNOSIS — E113213 Type 2 diabetes mellitus with mild nonproliferative diabetic retinopathy with macular edema, bilateral: Secondary | ICD-10-CM | POA: Diagnosis not present

## 2016-09-15 ENCOUNTER — Emergency Department: Payer: PPO

## 2016-09-15 ENCOUNTER — Emergency Department
Admission: EM | Admit: 2016-09-15 | Discharge: 2016-09-15 | Disposition: A | Discharge status: Home / Self Care | Payer: PPO | Attending: Emergency Medicine | Admitting: Emergency Medicine

## 2016-09-15 DIAGNOSIS — Y9389 Activity, other specified: Secondary | ICD-10-CM | POA: Insufficient documentation

## 2016-09-15 DIAGNOSIS — Y929 Unspecified place or not applicable: Secondary | ICD-10-CM | POA: Diagnosis not present

## 2016-09-15 DIAGNOSIS — S80212A Abrasion, left knee, initial encounter: Secondary | ICD-10-CM | POA: Diagnosis not present

## 2016-09-15 DIAGNOSIS — W109XXA Fall (on) (from) unspecified stairs and steps, initial encounter: Secondary | ICD-10-CM | POA: Diagnosis not present

## 2016-09-15 DIAGNOSIS — Y999 Unspecified external cause status: Secondary | ICD-10-CM | POA: Diagnosis not present

## 2016-09-15 DIAGNOSIS — M79672 Pain in left foot: Secondary | ICD-10-CM | POA: Diagnosis not present

## 2016-09-15 DIAGNOSIS — S99922A Unspecified injury of left foot, initial encounter: Secondary | ICD-10-CM | POA: Diagnosis not present

## 2016-09-15 NOTE — ED Triage Notes (Signed)
Patient was stepping up onto a porch and lost her footing. Patient fell onto left side/lower leg. Patient presents with left lower leg pain from the foot to the knee

## 2016-09-15 NOTE — ED Provider Notes (Signed)
[facility] Emergency Department Provider Note  ____________________________________________  Time seen: Approximately 2:22 PM  I have reviewed the triage vital signs and the nursing notes.   HISTORY  Chief Complaint Leg Pain    HPI Julia Simmons is a 56 y.o. female that presents to the emergency department with left foot pain after falling on concrete yesterday. Patient states that she was at her sister-in-law's and was going up some steps and lost her footing and fell on the left knee and foot. Patient denies hitting head or losing consciousness. Patient states that she remembers everything from fall. She denies feeling strange or dizzy before falling. Patient states that her knee hurts where it is scratched but she is not having difficulty moving it. Her pain is primarily at the end of her left foot. She has been walking on it with help. She states that she has neuropathy in both feet and numbness and tingling has not changed. She denies headache, visual changes, shortness of breath, chest pain, nausea, vomiting, abdominal pain.   No past medical history on file.  There are no active problems to display for this patient.   No past surgical history on file.  Prior to Admission medications   Not on File    Allergies Patient has no allergy information on record.  Family History  Problem Relation Age of Onset  . Breast cancer Neg Hx     Social History Social History  Substance Use Topics  . Smoking status: Not on file  . Smokeless tobacco: Not on file  . Alcohol use Not on file     Review of Systems  Cardiovascular: No chest pain. Respiratory: No SOB. Gastrointestinal: No abdominal pain.  No nausea, no vomiting.  Musculoskeletal: Left foot pain. Skin: Negative for rash, lacerations, ecchymosis. Positive for abrasion over left knee. Neurological: Negative for headaches.   ____________________________________________   PHYSICAL  EXAM:  VITAL SIGNS: ED Triage Vitals [09/15/16 1346]  Enc Vitals Group     BP (!) 204/98     Pulse Rate 84     Resp 20     Temp 98.1 F (36.7 C)     Temp Source Oral     SpO2 99 %     Weight (!) 320 lb (145.2 kg)     Height 5\' 5"  (1.651 m)     Head Circumference      Peak Flow      Pain Score 8     Pain Loc      Pain Edu?      Excl. in [place]?      Constitutional: Alert and oriented. Well appearing and in no acute distress. Eyes: Conjunctivae are normal. PERRL. EOMI. Head: Atraumatic. ENT:      Ears:      Nose: No congestion/rhinnorhea.      Mouth/Throat: Mucous membranes are moist.  Neck: No stridor.  Cardiovascular: Normal rate, regular rhythm.  Good peripheral circulation. Respiratory: Normal respiratory effort without tachypnea or retractions. Lungs CTAB. Good air entry to the bases with no decreased or absent breath sounds. Musculoskeletal: Full range of motion to all extremities. No gross deformities appreciated. No tenderness to palpation over left knee. Tender to palpation throughout distal left foot. Neurologic:  Normal speech and language. No gross focal neurologic deficits are appreciated.  Skin:  Moderate dry and cracked skin covering bilateral feet.   ____________________________________________   LABS (all labs ordered are listed, but only abnormal results are displayed)  Labs Reviewed -  No data to display ____________________________________________  EKG   ____________________________________________  RADIOLOGY Robinette Haines, personally viewed and evaluated these images (plain radiographs) as part of my medical decision making, as well as reviewing the written report by the radiologist.  Dg Foot Complete Left  Result Date: 09/15/2016 CLINICAL DATA:  Left foot pain following a fall. EXAM: LEFT FOOT - COMPLETE 3+ VIEW COMPARISON:  02/20/2013. FINDINGS: Diffuse soft tissue swelling, most pronounced distally and dorsally. Calcaneal and dorsal tarsal  spurs. Mild anterior talotibial spur formation. No fracture or dislocation seen. IMPRESSION: No fracture.  Degenerative spurs. Electronically Signed   By: Claudie Revering M.D.   On: 09/15/2016 14:50    ____________________________________________    PROCEDURES  Procedure(s) performed:    Procedures    Medications - No data to display   ____________________________________________   INITIAL IMPRESSION / ASSESSMENT AND PLAN / ED COURSE  Pertinent labs & imaging results that were available during my care of the patient were reviewed by me and considered in my medical decision making (see chart for details).  Review of the [place] CSRS was performed in accordance of the [place] prior to dispensing any controlled drugs.     Patient's diagnosis is consistent with foot injury. Vital signs and exam are reassuring. Foot x-ray negative for acute bony abnormalities. Foot was wrapped with Ace bandage. Patient is to follow up with PCP as directed. Patient is given ED precautions to return to the ED for any worsening or new symptoms.     ____________________________________________  FINAL CLINICAL IMPRESSION(S) / ED DIAGNOSES  Final diagnoses:  Injury of left foot, initial encounter      NEW MEDICATIONS STARTED DURING THIS VISIT:  There are no discharge medications for this patient.       This chart was dictated using voice recognition software/Dragon. Despite best efforts to proofread, errors can occur which can change the meaning. Any change was purely unintentional.    Laban Emperor, PA-C 09/15/16 [place], MD 09/15/16 [phone]

## 2016-09-15 NOTE — ED Notes (Signed)

## 2017-01-11 ENCOUNTER — Other Ambulatory Visit: Payer: Self-pay | Admitting: Pharmacist

## 2017-01-11 NOTE — Patient Outreach (Signed)
[facility]) Care Management  01/11/2017  Julia Simmons June 26, 1960 [id number]  Subjective:  56 year old female referred to [facility] from [place] for medication assistance with Levemir.  Reviewed KPN, Care Everywhere, and [facility] for patient information.  PMHx includes, but not limited to, diabetes, HTN, HLD, bilateral LE edema, and vitamin D deficiency.    Successful telephone encounter with patient. HIPAA identifiers verified. Patient confirmes she has [employer] insurance and is currently in the coverage gap.  She states she needs medication assistance with both her insulins, Novolog and Levemir.  She does not think that she meets eligibility for LIS / Extra Help due to husband's income with a household of 2.   Objective:  Medications Reviewed Today    Reviewed by Rudean Haskell, RPH (Pharmacist) on 01/11/17 at [facility] List Status: <None>  Medication Order Taking? Sig Documenting Provider Last Dose Status Informant  amLODipine-benazepril (LOTREL) 10-40 MG capsule [id number] Yes Take 1 capsule by mouth daily. [provider] Taking Active Self  aspirin EC 81 MG tablet [id number] Yes Take 81 mg by mouth daily. [provider] Taking Active   furosemide (LASIX) 40 MG tablet [id number] Yes Take 40 mg by mouth daily. [provider] Taking Active Self  insulin aspart (NOVOLOG FLEXPEN) 100 UNIT/ML FlexPen [id number] Yes Inject 8 Units into the skin 3 (three) times daily with meals. [provider] Taking Active Self  Insulin Detemir (LEVEMIR FLEXPEN) 100 UNIT/ML Pen [id number] Yes Inject 150 Units into the skin daily at 10 pm. [provider] Taking Active Self  metFORMIN (GLUCOPHAGE) 500 MG tablet [id number] Yes Take 1,500 mg by mouth daily with breakfast. [provider] Taking Active   metFORMIN (GLUCOPHAGE) 500 MG tablet [id number] Yes Take 1,000 mg by mouth daily with supper. [provider] Taking Active Self   pioglitazone (ACTOS) 45 MG tablet [id number] Yes Take 45 mg by mouth daily. [provider] Taking Active Self  potassium chloride (K-DUR) 10 MEQ tablet [id number] Yes Take 10 mEq by mouth daily. [provider] Taking Active Self  pravastatin (PRAVACHOL) 40 MG tablet [id number] Yes Take 40 mg by mouth daily. [provider] Taking Active Self  Vitamin D, Ergocalciferol, 2000 units CAPS [id number] Yes Take 2,000 Units by mouth daily. [provider] Taking Active Self           Assessment:  Medication reconciliation performed telephonically.   Drugs sorted by system:   Cardiovascular: Amlodipine-benazepril, aspirin 81mg , furosemide, pravastatin, potassium  Endocrine:Novolog, Levemir, metformin, pioglitazone  Vitamins/Minerals: Vitamin D  Medication cost assistance:  Novo Nordisk PAP includes Novolog and Levemir.    Medicare Part D patients needs to have spent > $1000 on prescriptions in current year.   Patient does not believe she has spent this amount.  I called patient's pharmacy to inquire but they were not able to give me this information without patient's consent on file. They will call the patient directly to let her know her TROOP.     Patient total household income must be at or below 300% FPL.     Patient will look for income documents to verify this.     Plan: I will call patient on Monday to inquire into her TROOP and total household income to see if she can start application for Novo Nordisk PAP.    Ralene Bathe, PharmD, [place] [phone]

## 2017-01-14 ENCOUNTER — Ambulatory Visit: Payer: Self-pay | Admitting: Pharmacist

## 2017-01-14 ENCOUNTER — Other Ambulatory Visit: Payer: Self-pay | Admitting: Pharmacist

## 2017-01-14 NOTE — Patient Outreach (Addendum)
[facility]) Care Management  01/14/2017  Julia Simmons 1960/10/25 [id number]   Unsuccessful outreach attempt to patient to follow-up on medication assistance.  No voicemail available to leave message.   Successful outreach attempt this afternoon. HIPAA identifiers verified. Patient states she has not been able to gather her income documents yet.  She did not receive a phone call from her pharmacy about her [place] but does have an EOB from [employer] stating she has spent $732.39 so far this year.  Patient understands she must spend > $1000 to be eligible for patient assistance for Levemir.  Patient verified she has my phone number and will call me when she has meet this TROOP.  Patient has no other questions or concerns at this time.    Plan: [facility] will close case at this time but am happy to assist in the future as needed.  Route note to provider and patient.   Ralene Bathe, PharmD, [place] [phone]

## 2017-03-25 DIAGNOSIS — E7800 Pure hypercholesterolemia, unspecified: Secondary | ICD-10-CM | POA: Diagnosis not present

## 2017-03-25 DIAGNOSIS — R600 Localized edema: Secondary | ICD-10-CM | POA: Diagnosis not present

## 2017-03-25 DIAGNOSIS — Z0000 Encounter for general adult medical examination without abnormal findings: Secondary | ICD-10-CM | POA: Diagnosis not present

## 2017-03-25 DIAGNOSIS — Z794 Long term (current) use of insulin: Secondary | ICD-10-CM | POA: Diagnosis not present

## 2017-03-25 DIAGNOSIS — E113293 Type 2 diabetes mellitus with mild nonproliferative diabetic retinopathy without macular edema, bilateral: Secondary | ICD-10-CM | POA: Diagnosis not present

## 2017-03-25 DIAGNOSIS — I10 Essential (primary) hypertension: Secondary | ICD-10-CM | POA: Diagnosis not present

## 2017-05-20 DIAGNOSIS — Z794 Long term (current) use of insulin: Secondary | ICD-10-CM | POA: Diagnosis not present

## 2017-05-20 DIAGNOSIS — E113293 Type 2 diabetes mellitus with mild nonproliferative diabetic retinopathy without macular edema, bilateral: Secondary | ICD-10-CM | POA: Diagnosis not present

## 2018-05-05 ENCOUNTER — Other Ambulatory Visit: Payer: Self-pay | Admitting: Internal Medicine

## 2018-05-05 DIAGNOSIS — Z1231 Encounter for screening mammogram for malignant neoplasm of breast: Secondary | ICD-10-CM

## 2019-08-19 LAB — EXTERNAL GENERIC LAB PROCEDURE: COLOGUARD: NEGATIVE

## 2019-08-19 LAB — COLOGUARD: COLOGUARD: NEGATIVE

## 2019-12-01 ENCOUNTER — Other Ambulatory Visit: Payer: Self-pay | Admitting: Physician Assistant

## 2019-12-01 DIAGNOSIS — D360 Benign neoplasm of lymph nodes: Secondary | ICD-10-CM

## 2020-05-09 ENCOUNTER — Other Ambulatory Visit: Payer: Self-pay | Admitting: Physician Assistant

## 2020-05-09 ENCOUNTER — Other Ambulatory Visit (HOSPITAL_COMMUNITY): Payer: Self-pay | Admitting: Physician Assistant

## 2020-05-09 DIAGNOSIS — I10 Essential (primary) hypertension: Secondary | ICD-10-CM

## 2020-05-23 ENCOUNTER — Ambulatory Visit
Admission: RE | Admit: 2020-05-23 | Discharge: 2020-05-23 | Disposition: A | Discharge status: Home / Self Care | Payer: Medicare Other | Source: Ambulatory Visit | Attending: Physician Assistant | Admitting: Physician Assistant

## 2020-05-23 ENCOUNTER — Other Ambulatory Visit: Payer: Self-pay

## 2020-05-23 DIAGNOSIS — I10 Essential (primary) hypertension: Secondary | ICD-10-CM | POA: Diagnosis present

## 2020-05-23 DIAGNOSIS — N281 Cyst of kidney, acquired: Secondary | ICD-10-CM | POA: Diagnosis not present

## 2020-05-23 DIAGNOSIS — E119 Type 2 diabetes mellitus without complications: Secondary | ICD-10-CM | POA: Insufficient documentation

## 2020-06-13 ENCOUNTER — Other Ambulatory Visit: Payer: Self-pay

## 2020-06-13 ENCOUNTER — Inpatient Hospital Stay
Admission: EM | Admit: 2020-06-13 | Discharge: 2020-06-17 | DRG: 193 | Disposition: A | Discharge status: Home / Self Care | Payer: Medicare Other | Attending: Internal Medicine | Admitting: Internal Medicine

## 2020-06-13 ENCOUNTER — Emergency Department: Payer: Medicare Other

## 2020-06-13 ENCOUNTER — Encounter: Payer: Self-pay | Admitting: Emergency Medicine

## 2020-06-13 DIAGNOSIS — Z6841 Body Mass Index (BMI) 40.0 and over, adult: Secondary | ICD-10-CM | POA: Diagnosis not present

## 2020-06-13 DIAGNOSIS — E11610 Type 2 diabetes mellitus with diabetic neuropathic arthropathy: Secondary | ICD-10-CM | POA: Diagnosis present

## 2020-06-13 DIAGNOSIS — Z7982 Long term (current) use of aspirin: Secondary | ICD-10-CM | POA: Diagnosis not present

## 2020-06-13 DIAGNOSIS — I5030 Unspecified diastolic (congestive) heart failure: Secondary | ICD-10-CM | POA: Diagnosis present

## 2020-06-13 DIAGNOSIS — Z794 Long term (current) use of insulin: Secondary | ICD-10-CM | POA: Diagnosis not present

## 2020-06-13 DIAGNOSIS — E1140 Type 2 diabetes mellitus with diabetic neuropathy, unspecified: Secondary | ICD-10-CM | POA: Diagnosis present

## 2020-06-13 DIAGNOSIS — R059 Cough, unspecified: Secondary | ICD-10-CM | POA: Diagnosis not present

## 2020-06-13 DIAGNOSIS — Z79899 Other long term (current) drug therapy: Secondary | ICD-10-CM | POA: Diagnosis not present

## 2020-06-13 DIAGNOSIS — Z20822 Contact with and (suspected) exposure to covid-19: Secondary | ICD-10-CM | POA: Diagnosis present

## 2020-06-13 DIAGNOSIS — I10 Essential (primary) hypertension: Secondary | ICD-10-CM | POA: Diagnosis not present

## 2020-06-13 DIAGNOSIS — Z7984 Long term (current) use of oral hypoglycemic drugs: Secondary | ICD-10-CM | POA: Diagnosis not present

## 2020-06-13 DIAGNOSIS — E11319 Type 2 diabetes mellitus with unspecified diabetic retinopathy without macular edema: Secondary | ICD-10-CM | POA: Diagnosis present

## 2020-06-13 DIAGNOSIS — Z888 Allergy status to other drugs, medicaments and biological substances status: Secondary | ICD-10-CM | POA: Diagnosis not present

## 2020-06-13 DIAGNOSIS — I110 Hypertensive heart disease with heart failure: Secondary | ICD-10-CM | POA: Diagnosis present

## 2020-06-13 DIAGNOSIS — J189 Pneumonia, unspecified organism: Principal | ICD-10-CM | POA: Diagnosis present

## 2020-06-13 DIAGNOSIS — J9601 Acute respiratory failure with hypoxia: Secondary | ICD-10-CM | POA: Diagnosis present

## 2020-06-13 DIAGNOSIS — E785 Hyperlipidemia, unspecified: Secondary | ICD-10-CM | POA: Diagnosis present

## 2020-06-13 DIAGNOSIS — Z882 Allergy status to sulfonamides status: Secondary | ICD-10-CM | POA: Diagnosis not present

## 2020-06-13 DIAGNOSIS — I361 Nonrheumatic tricuspid (valve) insufficiency: Secondary | ICD-10-CM | POA: Diagnosis not present

## 2020-06-13 DIAGNOSIS — R0600 Dyspnea, unspecified: Secondary | ICD-10-CM | POA: Diagnosis not present

## 2020-06-13 DIAGNOSIS — R0602 Shortness of breath: Secondary | ICD-10-CM

## 2020-06-13 HISTORY — DX: Essential (primary) hypertension: I10

## 2020-06-13 HISTORY — DX: Morbid (severe) obesity due to excess calories: E66.01

## 2020-06-13 HISTORY — DX: Charcot's joint, unspecified ankle and foot: M14.679

## 2020-06-13 HISTORY — DX: Type 2 diabetes mellitus with diabetic neuropathy, unspecified: E11.40

## 2020-06-13 HISTORY — DX: Type 2 diabetes mellitus with unspecified diabetic retinopathy without macular edema: E11.319

## 2020-06-13 LAB — CBC WITH DIFFERENTIAL/PLATELET
Abs Immature Granulocytes: 0.02 10*3/uL (ref 0.00–0.07)
Basophils Absolute: 0.1 10*3/uL (ref 0.0–0.1)
Basophils Relative: 1 %
Eosinophils Absolute: 0.1 10*3/uL (ref 0.0–0.5)
Eosinophils Relative: 2 %
HCT: 40.5 % (ref 36.0–46.0)
Hemoglobin: 13.2 g/dL (ref 12.0–15.0)
Immature Granulocytes: 0 %
Lymphocytes Relative: 21 %
Lymphs Abs: 1.3 10*3/uL (ref 0.7–4.0)
MCH: 29.8 pg (ref 26.0–34.0)
MCHC: 32.6 g/dL (ref 30.0–36.0)
MCV: 91.4 fL (ref 80.0–100.0)
Monocytes Absolute: 0.6 10*3/uL (ref 0.1–1.0)
Monocytes Relative: 10 %
Neutro Abs: 4.1 10*3/uL (ref 1.7–7.7)
Neutrophils Relative %: 66 %
Platelets: 273 10*3/uL (ref 150–400)
RBC: 4.43 MIL/uL (ref 3.87–5.11)
RDW: 12.5 % (ref 11.5–15.5)
WBC: 6.2 10*3/uL (ref 4.0–10.5)
nRBC: 0 % (ref 0.0–0.2)

## 2020-06-13 LAB — EXPECTORATED SPUTUM ASSESSMENT W GRAM STAIN, RFLX TO RESP C: Special Requests: NORMAL

## 2020-06-13 LAB — COMPREHENSIVE METABOLIC PANEL
ALT: 17 U/L (ref 0–44)
AST: 19 U/L (ref 15–41)
Albumin: 3.8 g/dL (ref 3.5–5.0)
Alkaline Phosphatase: 61 U/L (ref 38–126)
Anion gap: 10 (ref 5–15)
BUN: 17 mg/dL (ref 6–20)
CO2: 25 mmol/L (ref 22–32)
Calcium: 8.8 mg/dL — ABNORMAL LOW (ref 8.9–10.3)
Chloride: 103 mmol/L (ref 98–111)
Creatinine, Ser: 0.76 mg/dL (ref 0.44–1.00)
GFR, Estimated: >60 mL/min (ref >60)
Glucose, Bld: 251 mg/dL — ABNORMAL HIGH (ref 70–99)
Potassium: 5 mmol/L (ref 3.5–5.1)
Sodium: 138 mmol/L (ref 135–145)
Total Bilirubin: 0.5 mg/dL (ref 0.3–1.2)
Total Protein: 7.7 g/dL (ref 6.5–8.1)

## 2020-06-13 LAB — GLUCOSE, CAPILLARY
Glucose-Capillary: 264 mg/dL — ABNORMAL HIGH (ref 70–99)
Glucose-Capillary: 321 mg/dL — ABNORMAL HIGH (ref 70–99)
Glucose-Capillary: 289 mg/dL — ABNORMAL HIGH (ref 70–99)

## 2020-06-13 LAB — TROPONIN I (HIGH SENSITIVITY)
Troponin I (High Sensitivity): 10 ng/L (ref <18)
Troponin I (High Sensitivity): 11 ng/L (ref <18)

## 2020-06-13 LAB — PROCALCITONIN: Procalcitonin: 0.1 ng/mL

## 2020-06-13 LAB — RESP PANEL BY RT-PCR (FLU A&B, COVID) ARPGX2
Influenza A by PCR: NEGATIVE
Influenza B by PCR: NEGATIVE
SARS Coronavirus 2 by RT PCR: NEGATIVE

## 2020-06-13 LAB — LACTIC ACID, PLASMA: Lactic Acid, Venous: 1.7 mmol/L (ref 0.5–1.9)

## 2020-06-13 LAB — HEMOGLOBIN A1C
Hgb A1c MFr Bld: 9.3 % — ABNORMAL HIGH (ref 4.8–5.6)
Mean Plasma Glucose: 220.21 mg/dL

## 2020-06-13 LAB — BRAIN NATRIURETIC PEPTIDE: B Natriuretic Peptide: 53.1 pg/mL (ref 0.0–100.0)

## 2020-06-13 MED ORDER — ENOXAPARIN SODIUM 80 MG/0.8ML ~~LOC~~ SOLN
0.5000 mg/kg | SUBCUTANEOUS | Status: DC
  Administered 2020-06-13 – 2020-06-16 (×4): 75 mg via SUBCUTANEOUS
  Filled 2020-06-13 (×5): qty 0.8

## 2020-06-13 MED ORDER — PRAVASTATIN SODIUM 20 MG PO TABS
40.0000 mg | ORAL_TABLET | Freq: Every day | ORAL | Status: DC
  Administered 2020-06-13 – 2020-06-16 (×4): 40 mg via ORAL
  Filled 2020-06-13 (×4): qty 2

## 2020-06-13 MED ORDER — BENZONATATE 100 MG PO CAPS
200.0000 mg | ORAL_CAPSULE | Freq: Three times a day (TID) | ORAL | Status: DC | PRN
  Administered 2020-06-13 – 2020-06-16 (×8): 200 mg via ORAL
  Filled 2020-06-13 (×8): qty 2

## 2020-06-13 MED ORDER — ALBUTEROL SULFATE HFA 108 (90 BASE) MCG/ACT IN AERS
2.0000 | INHALATION_SPRAY | RESPIRATORY_TRACT | Status: DC | PRN
  Filled 2020-06-13: qty 6.7

## 2020-06-13 MED ORDER — BENAZEPRIL HCL 20 MG PO TABS
40.0000 mg | ORAL_TABLET | Freq: Every day | ORAL | Status: DC
  Administered 2020-06-13 – 2020-06-17 (×5): 40 mg via ORAL
  Filled 2020-06-13 (×5): qty 2

## 2020-06-13 MED ORDER — AMLODIPINE BESYLATE 10 MG PO TABS
10.0000 mg | ORAL_TABLET | Freq: Every day | ORAL | Status: DC
  Administered 2020-06-13 – 2020-06-17 (×5): 10 mg via ORAL
  Filled 2020-06-13 (×5): qty 1

## 2020-06-13 MED ORDER — ASPIRIN EC 81 MG PO TBEC
81.0000 mg | DELAYED_RELEASE_TABLET | Freq: Every day | ORAL | Status: DC
  Administered 2020-06-13 – 2020-06-17 (×5): 81 mg via ORAL
  Filled 2020-06-13 (×5): qty 1

## 2020-06-13 MED ORDER — ENOXAPARIN SODIUM 40 MG/0.4ML ~~LOC~~ SOLN: 40.0000 mg | SUBCUTANEOUS | Status: DC

## 2020-06-13 MED ORDER — INSULIN ASPART 100 UNIT/ML ~~LOC~~ SOLN
0.0000 [IU] | Freq: Every day | SUBCUTANEOUS | Status: DC
  Administered 2020-06-13 – 2020-06-14 (×2): 3 [IU] via SUBCUTANEOUS
  Administered 2020-06-15: 22:00:00 2 [IU] via SUBCUTANEOUS
  Administered 2020-06-16: 21:00:00 3 [IU] via SUBCUTANEOUS
  Filled 2020-06-13 (×4): qty 1

## 2020-06-13 MED ORDER — SODIUM CHLORIDE 0.9 % IV SOLN
1.0000 g | Freq: Once | INTRAVENOUS | Status: AC
  Administered 2020-06-13: 11:00:00 1 g via INTRAVENOUS
  Filled 2020-06-13: qty 10

## 2020-06-13 MED ORDER — FUROSEMIDE 40 MG PO TABS
40.0000 mg | ORAL_TABLET | Freq: Every day | ORAL | Status: DC
  Administered 2020-06-13 – 2020-06-16 (×4): 40 mg via ORAL
  Filled 2020-06-13 (×4): qty 1

## 2020-06-13 MED ORDER — SODIUM CHLORIDE 0.9 % IV SOLN
500.0000 mg | INTRAVENOUS | Status: DC
  Administered 2020-06-13 – 2020-06-14 (×2): 500 mg via INTRAVENOUS
  Filled 2020-06-13 (×4): qty 500

## 2020-06-13 MED ORDER — ACETAMINOPHEN-CODEINE 120-12 MG/5ML PO SOLN
5.0000 mL | Freq: Once | ORAL | Status: AC
  Administered 2020-06-13: 5 mL via ORAL
  Filled 2020-06-13: qty 5

## 2020-06-13 MED ORDER — SODIUM CHLORIDE 0.9 % IV SOLN
2.0000 g | INTRAVENOUS | Status: AC
  Administered 2020-06-14 – 2020-06-17 (×4): 2 g via INTRAVENOUS
  Filled 2020-06-13 (×4): qty 2

## 2020-06-13 MED ORDER — AMLODIPINE BESY-BENAZEPRIL HCL 10-40 MG PO CAPS: 1.0000 | ORAL_CAPSULE | Freq: Every day | ORAL | Status: DC

## 2020-06-13 MED ORDER — ACETAMINOPHEN 325 MG PO TABS
650.0000 mg | ORAL_TABLET | Freq: Four times a day (QID) | ORAL | Status: DC | PRN
  Administered 2020-06-13 – 2020-06-14 (×2): 650 mg via ORAL
  Filled 2020-06-13 (×2): qty 2

## 2020-06-13 MED ORDER — INSULIN ASPART 100 UNIT/ML ~~LOC~~ SOLN
0.0000 [IU] | Freq: Three times a day (TID) | SUBCUTANEOUS | Status: DC
  Administered 2020-06-13 – 2020-06-15 (×5): 15 [IU] via SUBCUTANEOUS
  Administered 2020-06-15 – 2020-06-16 (×3): 7 [IU] via SUBCUTANEOUS
  Administered 2020-06-16: 13:00:00 15 [IU] via SUBCUTANEOUS
  Administered 2020-06-16: 17:00:00 7 [IU] via SUBCUTANEOUS
  Administered 2020-06-17: 15 [IU] via SUBCUTANEOUS
  Filled 2020-06-13 (×11): qty 1

## 2020-06-13 MED ORDER — HYDRALAZINE HCL 20 MG/ML IJ SOLN
5.0000 mg | INTRAMUSCULAR | Status: DC | PRN
  Administered 2020-06-13: 13:00:00 5 mg via INTRAVENOUS
  Filled 2020-06-13: qty 1

## 2020-06-13 MED ORDER — INSULIN DETEMIR 100 UNIT/ML FLEXPEN: 150.0000 [IU] | PEN_INJECTOR | Freq: Every day | SUBCUTANEOUS | Status: DC

## 2020-06-13 NOTE — ED Notes (Signed)
This RN attempted to obtain iv access multiple time. Unable to obtain access at this time, IV team consult placed

## 2020-06-13 NOTE — ED Notes (Signed)
Pt given water as requested with verbal okay from provider [place]. Assisted with repositioning in bed.

## 2020-06-13 NOTE — H&P (Signed)
History and Physical    RASHAE Simmons OIL:[id number] DOB: [birth year] DOA: 06/13/2020  PCP: Patrice Paradise, MD Consultants: Tedd Sias - endocrinology; Graciela Husbands - podiatry Patient coming from:  Home - lives with husband and son; NOK: Husband, [phone]  Chief Complaint: Cough, fever  HPI: Julia Simmons is a 59 y.o. female with medical history significant of HTN; DM; and Charcot foot presenting with cough, fever.  For the last 2 days, she felt really bad.  She thought maybe she had COVID despite vaccine.  She felt weak and tired.  Her breathing wasn't right and her back was hurting from coughing so much. Cough was nonproductive.  Temp to 99. No sick contacts.   ED Course:  2 days of cough, fever.  CXR with RLL PNA.  Sats 86-89% on RA, on 2L O2.  COVID negative.  No sepsis.  Review of Systems: As per HPI; otherwise review of systems reviewed and negative.   Ambulatory Status:  Ambulates without assistance  COVID Vaccine Status:    Complete, no booster  Past Medical History:  Diagnosis Date  . Charcot's joint of foot   . Diabetes mellitus with neuropathy (HCC)   . Diabetic retinopathy (HCC)   . Hypertension   . Morbid obesity with BMI of 50.0-59.9, adult [facility])     Past Surgical History:  Procedure Laterality Date  . CESAREAN SECTION      Social History   Socioeconomic History  . Marital status: Married    Spouse name: Not on file  . Number of children: Not on file  . Years of education: Not on file  . Highest education level: Not on file  Occupational History  . Occupation: disability  Tobacco Use  . Smoking status: Never Smoker  . Smokeless tobacco: Never Used  Substance and Sexual Activity  . Alcohol use: Not Currently  . Drug use: Never  . Sexual activity: Not on file  Other Topics Concern  . Not on file  Social History Narrative  . Not on file   Social Determinants of Health   Financial Resource Strain: Not on file  Food Insecurity: Not on file   Transportation Needs: Not on file  Physical Activity: Not on file  Stress: Not on file  Social Connections: Not on file  Intimate Partner Violence: Not on file    Allergies  Allergen Reactions  . Hydrochlorothiazide Swelling    Facial swelling  . Sulfa Antibiotics Swelling    Facial swelling    Family History  Problem Relation Age of Onset  . Breast cancer Neg Hx     Prior to Admission medications   Medication Sig Start Date End Date Taking? Authorizing Provider  amLODipine-benazepril (LOTREL) 10-40 MG capsule Take 1 capsule by mouth daily.    [provider]  aspirin EC 81 MG tablet Take 81 mg by mouth daily.    [provider]  furosemide (LASIX) 40 MG tablet Take 40 mg by mouth daily.    [provider]  insulin aspart (NOVOLOG FLEXPEN) 100 UNIT/ML FlexPen Inject 8 Units into the skin 3 (three) times daily with meals.    [provider]  Insulin Detemir (LEVEMIR FLEXPEN) 100 UNIT/ML Pen Inject 150 Units into the skin daily at 10 pm.    [provider]  metFORMIN (GLUCOPHAGE) 500 MG tablet Take 1,500 mg by mouth daily with breakfast.    [provider]  metFORMIN (GLUCOPHAGE) 500 MG tablet Take 1,000 mg by mouth daily with supper.  [provider]  pioglitazone (ACTOS) 45 MG tablet Take 45 mg by mouth daily.    [provider]  potassium chloride (K-DUR) 10 MEQ tablet Take 10 mEq by mouth daily.    [provider]  pravastatin (PRAVACHOL) 40 MG tablet Take 40 mg by mouth daily.    [provider]  Vitamin D, Ergocalciferol, 2000 units CAPS Take 2,000 Units by mouth daily.    [provider]    Physical Exam: Vitals:   06/13/20 1303 06/13/20 1324 06/13/20 1358 06/13/20 1608  BP:   (!) 176/74 (!) 156/91  Pulse: 88 95 95 98  Resp: (!) 22 20 18 18   Temp:   100.2 F (37.9 C) (!) 100.5 F (38.1 C)  TempSrc:   Oral   SpO2: 96%  96% 93%  Weight:   (!) 148.6 kg   Height:   5'  6" (1.676 m)      . General:  Appears mildly ill, sitting up at bedside but in NAD . Eyes:  PERRL, EOMI, normal lids, iris . ENT:  grossly normal hearing, lips & tongue, mmm . Neck:  no LAD, masses or thyromegaly . Cardiovascular:  RRR, no m/r/g. No LE edema.  Marland Kitchen Respiratory:   CTA bilaterally with no wheezes/rales/rhonchi.  Mildly increased respiratory effort, on 2L [place] O2. . Abdomen:  soft, NT, ND, NABS . Back:   normal alignment, no CVAT . Skin:  no rash or induration seen on limited exam . Musculoskeletal:  grossly normal tone BUE/BLE, good ROM, no bony abnormality . Psychiatric:  grossly normal mood and affect, speech fluent and appropriate, AOx3 . Neurologic:  CN 2-12 grossly intact, moves all extremities in coordinated fashion    Radiological Exams on Admission: Independently reviewed - see discussion in A/P where applicable  DG Chest 1 View  Result Date: 06/13/2020 CLINICAL DATA:  Cough and fever EXAM: CHEST  1 VIEW COMPARISON:  None. FINDINGS: There is patchy airspace opacity in the right lower lung region. There is atelectasis in the left mid lung. There is cardiomegaly with pulmonary venous hypertension. No adenopathy no bone lesions. IMPRESSION: Patchy airspace opacity suspicious for pneumonia in the right lower lung region. Mild left midlung atelectasis. Underlying cardiomegaly with pulmonary vascular congestion. Electronically Signed   By: Lowella Grip III M.D.   On: 06/13/2020 08:17    EKG: Independently reviewed.  NSR with rate 85; no evidence of acute ischemia   Labs on Admission: I have personally reviewed the available labs and imaging studies at the time of the admission.  Pertinent labs:   Glucose 251 BNP 53.1 HS troponin 10 Lactate 1.7 Normal CBC COVID/flu negative   Assessment/Plan Principal Problem:   RLL pneumonia Active Problems:   Diabetes mellitus with neuropathy ([place])   Hypertension   Morbid obesity with BMI of 50.0-59.9, adult (HCC)    RLL PNA -Patient presenting with productive cough, mildly decreased oxygen saturation, and infiltrate in right lower lobe on chest x-ray -This appears to be most likely community-acquired pneumonia.  -Influenza negative. -COVID-19 negative. -Gram stain and culture is recommended by IDSA guidelines for patients with severe CAP; those being empirically treated for or previously infected with MRSA/Pseudomonas; or those who were hospitalized and received parenteral antibiotics in the last 90 days.  However, will order sputum gram stain and culture because: RF for drug-resistant organisms are ill-defined; + cultures help to tailor treatment; negative cultures can help to stop antibiotics early; culture data helps generate antibiograms; and viruses can coexist  with bacterial infections. -Will order lower respiratory tract procalcitonin level.   >0.5 indicates infection and >>0.5 indicates more serious disease.  As the procalcitonin level normalizes, it will be reasonable to consider de-escalation of antibiotic coverage.  The sensitivity of procalcitonin is variable and should not be used alone to guide treatment. -Will admit due to PNA with new hypoxia (91% on room air) -Will start Azithromycin 500 mg IV daily and Rocephin due to no risk factors for MDR cause.  Short courses of treatment (3-5 days) are usually adequate depending on the severity of the infection, starting with today as antibiotic Day 1. -Fever control -Repeat CBC in am -Will add albuterol PRN -Will add Tessalon perles for cough  HTN -Continue Lotrel, Lasix  DM -Will check A1c -hold Glucophage, Actos -Continue Levemir (high dose!) -Cover with resistant-scale SSI  HLD -Continue Pravachol  Obesity -Body mass index is 52.88 kg/m..  -Weight loss should be encouraged -Outpatient PCP/bariatric medicine/bariatric surgery f/u encouraged    Note: This patient has been tested and is negative for the novel coronavirus COVID-19. She  has been fully vaccinated against COVID-19.    DVT prophylaxis: Lovenox  Code Status:  Full - confirmed with patient Family Communication: None present Disposition Plan:  The patient is from: home  Anticipated d/c is to: home without [facility] services   Anticipated d/c date will depend on clinical response to treatment, but likely 2-3 days depending on if she has a persistent O2 requirement  Patient is currently: acutely ill Consults called: None Admission status: Admit - It is my clinical opinion that admission to INPATIENT is reasonable and necessary because of the expectation that this patient will require hospital care that crosses at least 2 midnights to treat this condition based on the medical complexity of the problems presented.  Given the aforementioned information, the predictability of an adverse outcome is felt to be significant.     Jonah Blue MD [facility]   How to contact the [facility] Attending or Consulting provider 7A - 7P or covering provider during after hours 7P -7A, for this patient?  1. Check the care team in [facility] and look for a) attending/consulting TRH provider listed and b) the [facility] team listed 2. Log into [email or web address] and use [place]'s universal password to access. If you do not have the password, please contact the hospital operator. 3. Locate the [facility] provider you are looking for under [facility] and page to a number that you can be directly reached. 4. If you still have difficulty reaching the provider, please page the [facility] (Director on Call) for the Hospitalists listed on amion for assistance.   06/13/2020, 7:01 PM

## 2020-06-13 NOTE — ED Notes (Signed)
Pt difficult stick per provider/previous RN. 1 set of cultures obtained by provider.

## 2020-06-13 NOTE — Progress Notes (Addendum)
Inpatient Diabetes Program Recommendations  AACE/ADA: New Consensus Statement on Inpatient Glycemic Control (2015)  Target Ranges:  Prepandial:   less than 140 mg/dL      Peak postprandial:   less than 180 mg/dL (1-2 hours)      Critically ill patients:  140 - 180 mg/dL   Results for MARGRETE, DELUDE (MRN [id number]) as of 06/13/2020 14:41  Ref. Range 06/13/2020 14:01  Glucose-Capillary Latest Ref Range: 70 - 99 mg/dL 476 (H)    Admit with: Pneumonia  History: DM  Home DM Meds: Novolog 8 units TID       Levemir 150 units QHS       Metformin 1500 mg QAM/ 1000 mg QPM       Actos 45 mg Daily  Current Orders: Novolog Resistant Correction Scale/ SSI (0-20 units) TID AC + HS   Endocrinologist: Dr. Carlena Sax with [facility]--last seen 05/16/2020 Was counseled to take the following: Continue metformin at 1000 mg BID Continue Trulicity 4.5 mg weekly  Continue Levemir 95 units qhs Add Humalog pen, inject 20 units daily before supper    MD- Please consider starting Levemir 45 units QHS (per chart review, Dr. Tedd Sias (ENDO) stated pt was supposed to be taking Levemir 95 units QHS)  50% total home dose per Dr. Pricilla Handler records    --Will follow patient during hospitalization--  Ambrose Finland RN, MSN, CDE Diabetes Coordinator Inpatient Glycemic Control Team Team Pager: [phone] (8a-5p)

## 2020-06-13 NOTE — ED Notes (Signed)
Provider Earleen Newport verbal okay to go ahead and start antibiotics. Provider aware only able to obtain 1 set of cultures d/t difficult stick.

## 2020-06-13 NOTE — Progress Notes (Signed)
Consulted to place PIV. Patient has 1 working PIV at this time. Primary RN will reenter order if 2nd PIV needed.

## 2020-06-13 NOTE — ED Provider Notes (Signed)
[facility] Emergency Department Provider Note  ____________________________________________  Time seen: Approximately 7:52 AM  I have reviewed the triage vital signs and the nursing notes.   HISTORY  Chief Complaint URI    HPI Julia Simmons is a 59 y.o. female that presents to the emergency department for evaluation of low-grade fever and nonproductive cough for 2 days. Patient denies any sick contacts. She had both doses of the Pfizer vaccine "many months ago." No shortness of breath, chest pain, vomiting, abdominal pain, diarrhea.  Past Medical History:  Diagnosis Date  . Charcot's joint of foot   . Diabetes mellitus without complication ([place])   . Diabetic retinopathy ([place])   . Hypertension     There are no problems to display for this patient.   Past Surgical History:  Procedure Laterality Date  . CESAREAN SECTION      Prior to Admission medications   Medication Sig Start Date End Date Taking? Authorizing Provider  amLODipine-benazepril (LOTREL) 10-40 MG capsule Take 1 capsule by mouth daily.    [provider]  aspirin EC 81 MG tablet Take 81 mg by mouth daily.    [provider]  furosemide (LASIX) 40 MG tablet Take 40 mg by mouth daily.    [provider]  insulin aspart (NOVOLOG FLEXPEN) 100 UNIT/ML FlexPen Inject 8 Units into the skin 3 (three) times daily with meals.    [provider]  Insulin Detemir (LEVEMIR FLEXPEN) 100 UNIT/ML Pen Inject 150 Units into the skin daily at 10 pm.    [provider]  metFORMIN (GLUCOPHAGE) 500 MG tablet Take 1,500 mg by mouth daily with breakfast.    [provider]  metFORMIN (GLUCOPHAGE) 500 MG tablet Take 1,000 mg by mouth daily with supper.    [provider]  pioglitazone (ACTOS) 45 MG tablet Take 45 mg by mouth daily.    [provider]  potassium chloride (K-DUR) 10 MEQ tablet Take 10 mEq by mouth daily.    [provider]  pravastatin (PRAVACHOL) 40 MG tablet Take 40 mg by mouth daily.    [provider]  Vitamin D, Ergocalciferol, 2000 units CAPS Take 2,000 Units by mouth daily.    [provider]    Allergies Hydrochlorothiazide and Sulfa antibiotics  Family History  Problem Relation Age of Onset  . Breast cancer Neg Hx     Social History Social History   Tobacco Use  . Smoking status: Never Smoker  . Smokeless tobacco: Never Used  Substance Use Topics  . Alcohol use: Not Currently  . Drug use: Never     Review of Systems  Constitutional: Positive for fever. Eyes: No visual changes. No discharge. ENT: Negative for congestion and rhinorrhea. Cardiovascular: No chest pain. Respiratory: Positive for cough. No SOB. Gastrointestinal: No abdominal pain.  No nausea, no vomiting.  No diarrhea.  No constipation. Musculoskeletal: Negative for musculoskeletal pain. Skin: Negative for rash, abrasions, lacerations, ecchymosis. Neurological: Negative for headaches.   ____________________________________________   PHYSICAL EXAM:  VITAL SIGNS: ED Triage Vitals  Enc Vitals Group     BP 06/13/20 0708 (!) 164/69     Pulse Rate 06/13/20 0708 79     Resp 06/13/20 0708 16     Temp 06/13/20 0708 99.3 F (37.4 C)     Temp Source 06/13/20 0708 Oral     SpO2 06/13/20 0708 92 %     Weight 06/13/20 0703 (!) 320 lb 1.7 oz (145.2 kg)  Height 06/13/20 0703 5\' 5"  (1.651 m)     Head Circumference --      Peak Flow --      Pain Score 06/13/20 0703 5     Pain Loc --      Pain Edu? --      Excl. in GC? --      Constitutional: Alert and oriented. Well appearing and in no acute distress. Eyes: Conjunctivae are normal. PERRL. EOMI. No discharge. Head: Atraumatic. ENT: No frontal and maxillary sinus tenderness.      Ears: Tympanic membranes pearly gray with good landmarks. No discharge.      Nose: No congestion/rhinnorhea.      Mouth/Throat: Mucous membranes are  moist. Oropharynx non-erythematous. Tonsils not enlarged. No exudates. Uvula midline. Neck: No stridor.   Hematological/Lymphatic/Immunilogical: No cervical lymphadenopathy. Cardiovascular: Normal rate, regular rhythm.  Good peripheral circulation. Respiratory: Normal respiratory effort without tachypnea or retractions. Lungs CTAB. Good air entry to the bases with no decreased or absent breath sounds. Gastrointestinal: Bowel sounds 4 quadrants. Soft and nontender to palpation. No guarding or rigidity. No palpable masses. No distention. Musculoskeletal: Full range of motion to all extremities. No gross deformities appreciated. Neurologic:  Normal speech and language. No gross focal neurologic deficits are appreciated.  Skin:  Skin is warm, dry and intact. No rash noted. Psychiatric: Mood and affect are normal. Speech and behavior are normal. Patient exhibits appropriate insight and judgement.   ____________________________________________   LABS (all labs ordered are listed, but only abnormal results are displayed)  Labs Reviewed  RESP PANEL BY RT-PCR (FLU A&B, COVID) ARPGX2  CULTURE, BLOOD (ROUTINE X 2)  CULTURE, BLOOD (ROUTINE X 2)  CBC WITH DIFFERENTIAL/PLATELET  COMPREHENSIVE METABOLIC PANEL  BRAIN NATRIURETIC PEPTIDE  LACTIC ACID, PLASMA  LACTIC ACID, PLASMA  TROPONIN I (HIGH SENSITIVITY)   ____________________________________________  EKG   ____________________________________________  RADIOLOGY 06/15/20, personally viewed and evaluated these images (plain radiographs) as part of my medical decision making, as well as reviewing the written report by the radiologist.  DG Chest 1 View  Result Date: 06/13/2020 CLINICAL DATA:  Cough and fever EXAM: CHEST  1 VIEW COMPARISON:  None. FINDINGS: There is patchy airspace opacity in the right lower lung region. There is atelectasis in the left mid lung. There is cardiomegaly with pulmonary venous hypertension. No  adenopathy no bone lesions. IMPRESSION: Patchy airspace opacity suspicious for pneumonia in the right lower lung region. Mild left midlung atelectasis. Underlying cardiomegaly with pulmonary vascular congestion. Electronically Signed   By: 06/15/2020 III M.D.   On: 06/13/2020 08:17    ____________________________________________    PROCEDURES  Procedure(s) performed:    Procedures    Medications  cefTRIAXone (ROCEPHIN) 1 g in sodium chloride 0.9 % 100 mL IVPB (has no administration in time range)  acetaminophen-codeine 120-12 MG/5ML solution 5 mL (5 mLs Oral Given 06/13/20 0758)     ____________________________________________   INITIAL IMPRESSION / ASSESSMENT AND PLAN / ED COURSE  Pertinent labs & imaging results that were available during my care of the patient were reviewed by me and considered in my medical decision making (see chart for details).  Review of the [place] CSRS was performed in accordance of the [facility] prior to dispensing any controlled drugs.   Chest x-ray shows a right lower lobe pneumonia.  Patient's diagnosis is consistent with pneumonia.  Covid and influenza tests are negative.  ----------------------------------------- 8:51 AM on 06/13/2020 -----------------------------------------  Patient's oxygen currently between 86%-89% % on room  air.  Patient placed on 2 L of oxygen.  Oxygen saturations increased to 92-93% on 2 L of oxygen.  Plan will be to admit patient to the hospital.  Lab work order was placed.  ----------------------------------------- 9:50 AM on 06/13/2020 -----------------------------------------  RN has been unable to obtain an IV at this time.   Lab work is pending.  Patient will be admitted to the hospitalist service for acute respiratory failure and community-acquired pneumonia.  Dr. Lorin Mercy accepted patient for admission.   NIKELLE TUSS was evaluated in Emergency Department on 06/13/2020 for the symptoms described in the  history of present illness. She was evaluated in the context of the global COVID-19 pandemic, which necessitated consideration that the patient might be at risk for infection with the SARS-CoV-2 virus that causes COVID-19. Institutional protocols and algorithms that pertain to the evaluation of patients at risk for COVID-19 are in a state of rapid change based on information released by regulatory bodies including the CDC and federal and state organizations. These policies and algorithms were followed during the patient's care in the ED.  ____________________________________________  FINAL CLINICAL IMPRESSION(S) / ED DIAGNOSES  Acute respiratory failure Community-acquired pneumonia   NEW MEDICATIONS STARTED DURING THIS VISIT:  ED Discharge Orders    None          This chart was dictated using voice recognition software/Dragon. Despite best efforts to proofread, errors can occur which can change the meaning. Any change was purely unintentional.    Laban Emperor, PA-C 06/13/20 1520    Lucrezia Starch, MD 06/13/20 2154

## 2020-06-13 NOTE — ED Triage Notes (Signed)
C/O cough, fever x 1 day.  AAOx3.  Skin warm and dry.  Dry cough.  NAD

## 2020-06-13 NOTE — Progress Notes (Signed)
PHARMACIST - PHYSICIAN COMMUNICATION  CONCERNING:  Enoxaparin (Lovenox) for DVT Prophylaxis    RECOMMENDATION: Patient was prescribed enoxaprin 40mg  q24 hours for VTE prophylaxis.   Filed Weights   06/13/20 0703 06/13/20 1358  Weight: (!) 145.2 kg (320 lb 1.7 oz) (!) 148.6 kg (327 lb 9.7 oz)    Body mass index is 52.88 kg/m.  Estimated Creatinine Clearance: 116.4 mL/min (by C-G formula based on SCr of 0.76 mg/dL).   Based on [facility] policy patient is candidate for enoxaparin 0.5mg /kg TBW SQ every 24 hours based on BMI being >30.  DESCRIPTION: Pharmacy has adjusted enoxaparin dose per [facility] policy.  Patient is now receiving enoxaparin 75 mg every 24 hours    [facility], PharmD, BCPS Clinical Pharmacist  06/13/2020 2:16 PM

## 2020-06-14 LAB — CBC WITH DIFFERENTIAL/PLATELET
Abs Immature Granulocytes: 0.01 10*3/uL (ref 0.00–0.07)
Basophils Absolute: 0.1 10*3/uL (ref 0.0–0.1)
Basophils Relative: 1 %
Eosinophils Absolute: 0.1 10*3/uL (ref 0.0–0.5)
Eosinophils Relative: 2 %
HCT: 40.1 % (ref 36.0–46.0)
Hemoglobin: 12.9 g/dL (ref 12.0–15.0)
Immature Granulocytes: 0 %
Lymphocytes Relative: 25 %
Lymphs Abs: 1.5 10*3/uL (ref 0.7–4.0)
MCH: 29.5 pg (ref 26.0–34.0)
MCHC: 32.2 g/dL (ref 30.0–36.0)
MCV: 91.8 fL (ref 80.0–100.0)
Monocytes Absolute: 0.7 10*3/uL (ref 0.1–1.0)
Monocytes Relative: 12 %
Neutro Abs: 3.5 10*3/uL (ref 1.7–7.7)
Neutrophils Relative %: 60 %
Platelets: 247 10*3/uL (ref 150–400)
RBC: 4.37 MIL/uL (ref 3.87–5.11)
RDW: 12.5 % (ref 11.5–15.5)
WBC: 5.8 10*3/uL (ref 4.0–10.5)
nRBC: 0 % (ref 0.0–0.2)

## 2020-06-14 LAB — BASIC METABOLIC PANEL
Anion gap: 9 (ref 5–15)
BUN: 17 mg/dL (ref 6–20)
CO2: 26 mmol/L (ref 22–32)
Calcium: 8.5 mg/dL — ABNORMAL LOW (ref 8.9–10.3)
Chloride: 101 mmol/L (ref 98–111)
Creatinine, Ser: 0.87 mg/dL (ref 0.44–1.00)
GFR, Estimated: >60 mL/min (ref >60)
Glucose, Bld: 290 mg/dL — ABNORMAL HIGH (ref 70–99)
Potassium: 3.7 mmol/L (ref 3.5–5.1)
Sodium: 136 mmol/L (ref 135–145)

## 2020-06-14 LAB — GLUCOSE, CAPILLARY
Glucose-Capillary: 311 mg/dL — ABNORMAL HIGH (ref 70–99)
Glucose-Capillary: 315 mg/dL — ABNORMAL HIGH (ref 70–99)
Glucose-Capillary: 301 mg/dL — ABNORMAL HIGH (ref 70–99)
Glucose-Capillary: 258 mg/dL — ABNORMAL HIGH (ref 70–99)

## 2020-06-14 LAB — STREP PNEUMONIAE URINARY ANTIGEN: Strep Pneumo Urinary Antigen: NEGATIVE

## 2020-06-14 MED ORDER — INSULIN DETEMIR 100 UNIT/ML ~~LOC~~ SOLN
40.0000 [IU] | Freq: Every day | SUBCUTANEOUS | Status: DC
  Administered 2020-06-14: 11:00:00 40 [IU] via SUBCUTANEOUS
  Filled 2020-06-14 (×2): qty 0.4

## 2020-06-14 NOTE — Progress Notes (Signed)
Inpatient Diabetes Program Recommendations  AACE/ADA: New Consensus Statement on Inpatient Glycemic Control   Target Ranges:  Prepandial:   less than 140 mg/dL      Peak postprandial:   less than 180 mg/dL (1-2 hours)      Critically ill patients:  140 - 180 mg/dL  Results for Julia Simmons, Julia Simmons (MRN [id number]) as of 06/14/2020 07:57  Ref. Range 06/14/2020 04:40  Glucose Latest Ref Range: 70 - 99 mg/dL 542 (H)   Results for Julia Simmons, Julia Simmons (MRN [id number]) as of 06/14/2020 07:57  Ref. Range 06/13/2020 14:01 06/13/2020 18:09 06/13/2020 19:48  Glucose-Capillary Latest Ref Range: 70 - 99 mg/dL 315 (H) 176 (H) 160 (H)  Results for Julia Simmons, Julia Simmons (MRN [id number]) as of 06/14/2020 07:57  Ref. Range 06/13/2020 14:18  Hemoglobin A1C Latest Ref Range: 4.8 - 5.6 % 9.3 (H)   Review of Glycemic Control  Diabetes history: DM2 Outpatient Diabetes medications: Levemir 95 units QHS, Humalog 22 units with supper, Metformin 1000 mg BID, Trulicity 4.5 mg Qweek (Sunday) Current orders for Inpatient glycemic control: Novolog 0-20 units TID with meals, Novolog 0-5 units QHS  Inpatient Diabetes Program Recommendations:    Insulin: Please consider ordering Levemir 40 units daily.  HbgA1C:  A1C 9.3% on 06/13/20 indicating an average glucose of 220 mg/dl over the past 2-3 months.  NOTE: Spoke with patient over the phone about diabetes and home regimen for diabetes control. Patient reports being followed by Dr. Tedd Sias for diabetes management and currently taking Levemir 95 units QHS, Humalog 22 units with supper, Metformin 1000 mg BID, and Trulicity 4.5 mg Qweek (Sunday) as an outpatient for diabetes control. Patient reports taking DM medications as prescribed and she notes that her glucose has ranged from 150-300 mg/dl over the past 1 week.  Discussed A1C results (9.3% on 06/13/20 ) and explained that current A1C indicates an average glucose of 220 mg/dl over the past 2-3 months. Prior A1C was 9.8% on 03/21/2020  (per Care Everywhere).   Discussed glucose and A1C goals. Discussed importance of checking CBGs and maintaining good CBG control to prevent long-term and short-term complications. Encouraged patient to follow up with Dr. Tedd Sias regarding DM management. Informed patient that basal insulin is not currently ordered but will ask attending provider about ordering Levemir insulin but at lower dose than taken outpatient.  Patient verbalized understanding of information discussed and reports no further questions at this time related to diabetes.  Thanks, Orlando Penner, RN, MSN, CDE Diabetes Coordinator Inpatient Diabetes Program [phone] (Team Pager)

## 2020-06-14 NOTE — Progress Notes (Signed)
SATURATION QUALIFICATIONS: (This note is used to comply with regulatory documentation for home oxygen)  Patient Saturations on Room Air at Rest = 90%  Patient Saturations on Room Air while Ambulating = 87%  Patient Saturations on 2 Liters of oxygen while Ambulating = 93%  Patient able to maintain O2 sats 93% ambulating around her room with 2L O2 via [place].  Using O2, patient had no complaints of SOB, weakness or dizziness.

## 2020-06-14 NOTE — Progress Notes (Signed)
PROGRESS NOTE    Julia Simmons  [id number] DOB: [birth year] DOA: 06/13/2020 PCP: Marinda Elk, MD   Brief Narrative: This 59 yrs old female with PMH significant for  HTN, DM, morbid obesity, charcot foot presents in the ED with c/o: fever and cough for last 2 days. She reports feeling weak and tired.She thought that she might be having a Covid infection despite having Covid vaccine.  She describes cough as nonproductive,  denies any blood in it. She was found to be febrile and hypoxic in the ED,  Chest x-ray shows right lower lobe infiltrate.  She was hypoxic with O2 sats of 86% on room air placed on 2 L with improvement in her O2 saturation to 92%.  There were no signs of sepsis.  Lactic acid 1.7. She is admitted for community-acquired pneumonia and started on empiric ceftriaxone and Zithromax.  Assessment & Plan:   Principal Problem:   RLL pneumonia Active Problems:   Diabetes mellitus with neuropathy ([place])   Hypertension   Morbid obesity with BMI of 50.0-59.9, adult (HCC)  Right LL PNA (community-acquired pneumonia): She presented with fever, no productive cough,  mildly decreased O2 sat and right LL infiltrate on chest x-ray. This most likely consistent with community-acquired pneumonia. Influenza negative, COVID-19 negative. Follow-up blood and sputum cultures. Continue empiric Zithromax and ceftriaxone(3 to 5 days treatment) Follow-up CBC and fever curve. Procalcitonin 0.10, lactic acid 1.7. Continue Tessalon Perles for cough De-escalate antibiotic based on clinical course.  HTN -Continue Lotrel, Lasix.  Diabetes Mellitus ; - Hemoglobin A1c 9.3 -hold Glucophage, Actos. -Continue Levemir  40 units daily -Cover with resistant-scale SSI  HLD -Continue Pravachol.  Charcot's deformity right plantar foot: Wound care consult.  Obesity -Body mass index is 52.88 kg/m..  -Weight loss should be encouraged -Outpatient PCP/bariatric medicine/bariatric surgery  f/u encouraged    DVT prophylaxis: Lovenox. Code Status: Full code Family Communication: No family at bedside  Disposition Plan:  Status is: Inpatient  Remains inpatient appropriate because:Inpatient level of care appropriate due to severity of illness   Dispo: The patient is from: Home              Anticipated d/c is to: Home              Anticipated d/c date is: 2 days              Patient currently is not medically stable to d/c.   Consultants:   None  Procedures:  None. Antimicrobials:  Anti-infectives (From admission, onward)   Start     Dose/Rate Route Frequency Ordered Stop   06/14/20 1000  cefTRIAXone (ROCEPHIN) 2 g in sodium chloride 0.9 % 100 mL IVPB        2 g 200 mL/hr over 30 Minutes Intravenous Every 24 hours 06/13/20 1344 06/18/20 0959   06/13/20 1500  azithromycin (ZITHROMAX) 500 mg in sodium chloride 0.9 % 250 mL IVPB        500 mg 250 mL/hr over 60 Minutes Intravenous Every 24 hours 06/13/20 1344 06/18/20 1459   06/13/20 0900  cefTRIAXone (ROCEPHIN) 1 g in sodium chloride 0.9 % 100 mL IVPB        1 g 200 mL/hr over 30 Minutes Intravenous  Once 06/13/20 0851 06/13/20 1232     Subjective: Patient was seen and examined at bedside.  Overnight events noted.  She reports feeling better.  She is on 2 L oxygen sats 93%. She reports cough is getting better.  Objective:  Vitals:   06/14/20 0509 06/14/20 0729 06/14/20 0732 06/14/20 1018  BP: (!) 135/44 (!) 89/37 (!) 128/53   Pulse: 82 78 76 81  Resp: 18 18    Temp: (!) 100.5 F (38.1 C) 99.2 F (37.3 C)    TempSrc: Oral Oral    SpO2: 93% 93% 91% 90%  Weight:      Height:        Intake/Output Summary (Last 24 hours) at 06/14/2020 1045 Last data filed at 06/14/2020 0650 Gross per 24 hour  Intake 350 ml  Output --  Net 350 ml   Filed Weights   06/13/20 0703 06/13/20 1358  Weight: (!) 145.2 kg (!) 148.6 kg    Examination:  General exam: Appears calm and comfortable, not in any  distress Respiratory system: Clear to auscultation. Respiratory effort normal. Cardiovascular system: S1 & S2 heard, RRR. No JVD, murmurs, rubs, gallops or clicks. No pedal edema. Gastrointestinal system: Abdomen is nondistended, soft and nontender. No organomegaly or masses felt. Normal bowel sounds heard. Central nervous system: Alert and oriented. No focal neurological deficits. Extremities: No edema no cyanosis no clubbing.  Right plantar wound noted.  Covered in dressing Skin: No rashes, lesions or ulcers Psychiatry: Judgement and insight appear normal. Mood & affect appropriate.     Data Reviewed: I have personally reviewed following labs and imaging studies  CBC: Recent Labs  Lab 06/13/20 1110 06/14/20 0440  WBC 6.2 5.8  NEUTROABS 4.1 3.5  HGB 13.2 12.9  HCT 40.5 40.1  MCV 91.4 91.8  PLT 273 [id number]   Basic Metabolic Panel: Recent Labs  Lab 06/13/20 1110 06/14/20 0440  NA 138 136  K 5.0 3.7  CL 103 101  CO2 25 26  GLUCOSE 251* 290*  BUN 17 17  CREATININE 0.76 0.87  CALCIUM 8.8* 8.5*   GFR: Estimated Creatinine Clearance: 107 mL/min (by C-G formula based on SCr of 0.87 mg/dL). Liver Function Tests: Recent Labs  Lab 06/13/20 1110  AST 19  ALT 17  ALKPHOS 61  BILITOT 0.5  PROT 7.7  ALBUMIN 3.8   No results for input(s): LIPASE, AMYLASE in the last 168 hours. No results for input(s): AMMONIA in the last 168 hours. Coagulation Profile: No results for input(s): INR, PROTIME in the last 168 hours. Cardiac Enzymes: No results for input(s): CKTOTAL, CKMB, CKMBINDEX, TROPONINI in the last 168 hours. BNP (last 3 results) No results for input(s): PROBNP in the last 8760 hours. HbA1C: Recent Labs    06/13/20 1418  HGBA1C 9.3*   CBG: Recent Labs  Lab 06/13/20 1401 06/13/20 1809 06/13/20 1948 06/14/20 0808  GLUCAP 264* 321* 289* 311*   Lipid Profile: No results for input(s): CHOL, HDL, LDLCALC, TRIG, CHOLHDL, LDLDIRECT in the last 72 hours. Thyroid  Function Tests: No results for input(s): TSH, T4TOTAL, FREET4, T3FREE, THYROIDAB in the last 72 hours. Anemia Panel: No results for input(s): VITAMINB12, FOLATE, FERRITIN, TIBC, IRON, RETICCTPCT in the last 72 hours. Sepsis Labs: Recent Labs  Lab 06/13/20 1110 06/13/20 1927  PROCALCITON  --  0.10  LATICACIDVEN 1.7  --     Recent Results (from the past 240 hour(s))  Resp Panel by RT-PCR (Flu A&B, Covid) Nasopharyngeal Swab     Status: None   Collection Time: 06/13/20  7:23 AM   Specimen: Nasopharyngeal Swab; Nasopharyngeal(NP) swabs in vial transport medium  Result Value Ref Range Status   SARS Coronavirus 2 by RT PCR NEGATIVE NEGATIVE Final    Comment: (NOTE) SARS-CoV-2 target nucleic  acids are NOT DETECTED.  The SARS-CoV-2 RNA is generally detectable in upper respiratory specimens during the acute phase of infection. The lowest concentration of SARS-CoV-2 viral copies this assay can detect is 138 copies/mL. A negative result does not preclude SARS-Cov-2 infection and should not be used as the sole basis for treatment or other patient management decisions. A negative result may occur with  improper specimen collection/handling, submission of specimen other than nasopharyngeal swab, presence of viral mutation(s) within the areas targeted by this assay, and inadequate number of viral copies(<138 copies/mL). A negative result must be combined with clinical observations, patient history, and epidemiological information. The expected result is Negative.  Fact Sheet for Patients:  BloggerCourse.com  Fact Sheet for Healthcare Providers:  SeriousBroker.it  This test is no t yet approved or cleared by the [place] FDA and  has been authorized for detection and/or diagnosis of SARS-CoV-2 by FDA under an Emergency Use Authorization (EUA). This EUA will remain  in effect (meaning this test can be used) for the duration of  the COVID-19 declaration under Section 564(b)(1) of the Act, 21 U.S.C.section 360bbb-3(b)(1), unless the authorization is terminated  or revoked sooner.       Influenza A by PCR NEGATIVE NEGATIVE Final   Influenza B by PCR NEGATIVE NEGATIVE Final    Comment: (NOTE) The Xpert Xpress SARS-CoV-2/FLU/RSV plus assay is intended as an aid in the diagnosis of influenza from Nasopharyngeal swab specimens and should not be used as a sole basis for treatment. Nasal washings and aspirates are unacceptable for Xpert Xpress SARS-CoV-2/FLU/RSV testing.  Fact Sheet for Patients: BloggerCourse.com  Fact Sheet for Healthcare Providers: SeriousBroker.it  This test is not yet approved or cleared by the [place] FDA and has been authorized for detection and/or diagnosis of SARS-CoV-2 by FDA under an Emergency Use Authorization (EUA). This EUA will remain in effect (meaning this test can be used) for the duration of the COVID-19 declaration under Section 564(b)(1) of the Act, 21 U.S.C. section 360bbb-3(b)(1), unless the authorization is terminated or revoked.  Performed at [facility], [address] [place], [place] [address]   Blood culture (routine x 2)     Status: None (Preliminary result)   Collection Time: 06/13/20 11:10 AM   Specimen: BLOOD  Result Value Ref Range Status   Specimen Description BLOOD BLOOD LEFT ARM  Final   Special Requests   Final    BOTTLES DRAWN AEROBIC AND ANAEROBIC Blood Culture adequate volume   Culture   Final    NO GROWTH < 24 HOURS Performed at [facility], [address] [place], [place] [address]    Report Status PENDING  Incomplete  Blood culture (routine x 2)     Status: None (Preliminary result)   Collection Time: 06/13/20  2:17 PM   Specimen: BLOOD  Result Value Ref Range Status   Specimen Description BLOOD BLOOD RIGHT HAND  Final   Special Requests   Final    BOTTLES  DRAWN AEROBIC AND ANAEROBIC Blood Culture adequate volume   Culture   Final    NO GROWTH < 24 HOURS Performed at [facility], [address] [place], [place] [address]    Report Status PENDING  Incomplete  Culture, sputum-assessment     Status: None   Collection Time: 06/13/20  7:21 PM   Specimen: Expectorated Sputum  Result Value Ref Range Status   Specimen Description EXPECTORATED SPUTUM  Final   Special Requests Normal  Final  Sputum evaluation   Final    THIS SPECIMEN IS ACCEPTABLE FOR SPUTUM CULTURE Performed at [facility], [address] [place], [place] [address]    Report Status 06/13/2020 FINAL  Final     Radiology Studies: DG Chest 1 View  Result Date: 06/13/2020 CLINICAL DATA:  Cough and fever EXAM: CHEST  1 VIEW COMPARISON:  None. FINDINGS: There is patchy airspace opacity in the right lower lung region. There is atelectasis in the left mid lung. There is cardiomegaly with pulmonary venous hypertension. No adenopathy no bone lesions. IMPRESSION: Patchy airspace opacity suspicious for pneumonia in the right lower lung region. Mild left midlung atelectasis. Underlying cardiomegaly with pulmonary vascular congestion. Electronically Signed   By: Lowella Grip III M.D.   On: 06/13/2020 08:17   Scheduled Meds: . amLODipine  10 mg Oral Daily   And  . benazepril  40 mg Oral Daily  . aspirin EC  81 mg Oral Daily  . enoxaparin (LOVENOX) injection  0.5 mg/kg Subcutaneous Q24H  . furosemide  40 mg Oral Daily  . insulin aspart  0-20 Units Subcutaneous TID WC  . insulin aspart  0-5 Units Subcutaneous QHS  . insulin detemir  40 Units Subcutaneous Daily  . pravastatin  40 mg Oral QHS   Continuous Infusions: . azithromycin 500 mg (06/13/20 1626)  . cefTRIAXone (ROCEPHIN)  IV       LOS: 1 day    Time spent: 25 mins    Shawna Clamp, MD [facility]   If 7PM-7AM, please contact night-coverage

## 2020-06-14 NOTE — Consult Note (Signed)
WOC Nurse Consult Note: Reason for Consult:Charcot deformity with open wound to right plantar foot.  Chronic.  Sees podiatry outpatient and is stable with daily betadine swabbing and dry bandage.  Wound type:Neuropathic ulcer to right plantar foot.  Pressure Injury POA: NA Measurement:2 cm x 2 cm calloused lesion with charcot deformity present and scabbed 0.2 cm central area noted.  Wound BFX:OVANVBT as noted above and calloused to periwound.  Drainage (amount, consistency, odor) dry Periwound:charcot deformity noted causing pressure and callous, scabbing lesion Dressing procedure/placement/frequency:Betadine to right plantar foot wound daily.  Cover with dry dressing.  Will not follow at this time.  Please re-consult if needed.  Maple Hudson MSN, RN, FNP-BC CWON Wound, Ostomy, Continence Nurse Pager [phone]

## 2020-06-15 LAB — PHOSPHORUS: Phosphorus: 2.9 mg/dL (ref 2.5–4.6)

## 2020-06-15 LAB — GLUCOSE, CAPILLARY
Glucose-Capillary: 226 mg/dL — ABNORMAL HIGH (ref 70–99)
Glucose-Capillary: 285 mg/dL — ABNORMAL HIGH (ref 70–99)
Glucose-Capillary: 338 mg/dL — ABNORMAL HIGH (ref 70–99)
Glucose-Capillary: 222 mg/dL — ABNORMAL HIGH (ref 70–99)

## 2020-06-15 LAB — CBC
HCT: 36.3 % (ref 36.0–46.0)
Hemoglobin: 12.3 g/dL (ref 12.0–15.0)
MCH: 30.2 pg (ref 26.0–34.0)
MCHC: 33.9 g/dL (ref 30.0–36.0)
MCV: 89.2 fL (ref 80.0–100.0)
Platelets: 247 10*3/uL (ref 150–400)
RBC: 4.07 MIL/uL (ref 3.87–5.11)
RDW: 12.4 % (ref 11.5–15.5)
WBC: 5.2 10*3/uL (ref 4.0–10.5)
nRBC: 0 % (ref 0.0–0.2)

## 2020-06-15 LAB — BASIC METABOLIC PANEL
Anion gap: 10 (ref 5–15)
BUN: 18 mg/dL (ref 6–20)
CO2: 26 mmol/L (ref 22–32)
Calcium: 8.6 mg/dL — ABNORMAL LOW (ref 8.9–10.3)
Chloride: 102 mmol/L (ref 98–111)
Creatinine, Ser: 0.81 mg/dL (ref 0.44–1.00)
GFR, Estimated: >60 mL/min (ref >60)
Glucose, Bld: 221 mg/dL — ABNORMAL HIGH (ref 70–99)
Potassium: 3.7 mmol/L (ref 3.5–5.1)
Sodium: 138 mmol/L (ref 135–145)

## 2020-06-15 LAB — MAGNESIUM: Magnesium: 1.8 mg/dL (ref 1.7–2.4)

## 2020-06-15 LAB — SARS CORONAVIRUS 2 (TAT 6-24 HRS): SARS Coronavirus 2: NEGATIVE

## 2020-06-15 LAB — HIV ANTIBODY (ROUTINE TESTING W REFLEX): HIV Screen 4th Generation wRfx: NONREACTIVE

## 2020-06-15 MED ORDER — GUAIFENESIN-DM 100-10 MG/5ML PO SYRP
10.0000 mL | ORAL_SOLUTION | Freq: Four times a day (QID) | ORAL | Status: DC | PRN
  Administered 2020-06-15 – 2020-06-17 (×2): 10 mL via ORAL
  Filled 2020-06-15 (×2): qty 10

## 2020-06-15 MED ORDER — INSULIN DETEMIR 100 UNIT/ML ~~LOC~~ SOLN
50.0000 [IU] | Freq: Every day | SUBCUTANEOUS | Status: DC
  Administered 2020-06-15 – 2020-06-16 (×2): 50 [IU] via SUBCUTANEOUS
  Filled 2020-06-15 (×2): qty 0.5

## 2020-06-15 MED ORDER — INSULIN ASPART 100 UNIT/ML ~~LOC~~ SOLN
6.0000 [IU] | Freq: Three times a day (TID) | SUBCUTANEOUS | Status: DC
  Administered 2020-06-15 – 2020-06-16 (×4): 6 [IU] via SUBCUTANEOUS
  Filled 2020-06-15 (×4): qty 1

## 2020-06-15 MED ORDER — AZITHROMYCIN 500 MG PO TABS
500.0000 mg | ORAL_TABLET | ORAL | Status: DC
Start: 2020-06-15 — End: 2020-06-17
  Administered 2020-06-15 – 2020-06-16 (×2): 500 mg via ORAL
  Filled 2020-06-15 (×2): qty 1

## 2020-06-15 NOTE — Evaluation (Addendum)
Occupational Therapy Evaluation Patient Details Name: Julia Simmons MRN: [id number] DOB: 1960-10-25 Today's Date: 06/15/2020    History of Present Illness Julia Simmons is a 59 y.o. female with medical history significant of HTN; DM; and Charcot foot who presented to the ED on 12/27 with c/o fever, cough, and generalized weakness. She was found to be febrile and hypoxic in the ED,  Chest x-ray shows right lower lobe infiltrate. Pt admitted for further management.   Clinical Impression   [name] was seen for OT evaluation this date. Pt was generally independent in all ADL management, however, reports occasionally using a SPC for functional mobility within the community 2/2 charcot foot pain. Pt denies use of supplemental O2 at baseline. She lives with her spouse and 55 y.o. son in a manufactured home with a ramped entrance.  Pt reports becoming easily fatigued or out of breath with minimal exertion. She is received on 3.5 L O2 with SpO2 at rest noted to be 95% at start of session. Pt is independent for ADL management within her hospital room. She is up ad lib w/o a device. She reports using the bathroom independently, dons B shoes while seated EOB using compensatory strategies w/o assist or prompting from therapist. Pt SpO2 noted to drop to 91% with functional tasks. Pt educated use of therapeutic rest breaks and pursed lip breathing strategies during functional tasks. SpO2 is noted to rebound to 96% when pursed lip breathing strategy implemented by pt. Further education provided on energy conservation strategies including pursed lip breathing, activity pacing, home/routines modifications, work simplification, AE/DME, prioritizing of meaningful occupations, and falls prevention. Handout provided to support recall and carryover of education provided. Pt return verbalized understanding of all education provided and denies further functional deficits at this time. All assessment and education completed  at time of evaluation. No further skilled OT needs identified. Will DC in house. Please re-consult if additional OT needs arise during this admission. Do not anticipate the need for additional skilled OT services upon hospital DC.       Follow Up Recommendations  No OT follow up    Equipment Recommendations  None recommended by OT    Recommendations for Other Services       Precautions / Restrictions Precautions Precautions: Fall Precaution Comments: Low fall - 3 Restrictions Weight Bearing Restrictions: No      Mobility Bed Mobility Overal bed mobility: Modified Independent Bed Mobility: Supine to Sit;Sit to Supine     Supine to sit: Modified independent (Device/Increase time);HOB elevated Sit to supine: Modified independent (Device/Increase time);HOB elevated   General bed mobility comments: Pt able to perform sup<>sit w/o physical assist. Min increased time/effort to perform. HOB elevated.    Transfers Overall transfer level: Independent Equipment used: None             General transfer comment: Pt up ad lib in room w/o device. She is able to perform STS from bed in lowest position w/o physical assist or device.    Balance Overall balance assessment: No apparent balance deficits (not formally assessed)                                         ADL either performed or assessed with clinical judgement   ADL Overall ADL's : At baseline  General ADL Comments: Pt presents at or near baseline level of functional independence for ADL management. She is able to independently don B shoes while seated EOB using compensatory dressing strategies she reports she has adapted to meet her needs at home. Pt is independent for functional mobility, up ad lib in room to use the bathroom and complete self-care without an assistive devices.     Vision Baseline Vision/History: Wears glasses;Retinopathy Wears  Glasses: Reading only Patient Visual Report: No change from baseline       Perception     Praxis      Pertinent Vitals/Pain Pain Assessment: No/denies pain     Hand Dominance Right   Extremity/Trunk Assessment Upper Extremity Assessment Upper Extremity Assessment: Overall WFL for tasks assessed (Full AROM in BUE, no focal weakness appreciated. Grip/FMC WNL.)   Lower Extremity Assessment Lower Extremity Assessment: Overall WFL for tasks assessed   Cervical / Trunk Assessment Cervical / Trunk Assessment: Normal   Communication Communication Communication: No difficulties   Cognition Arousal/Alertness: Awake/alert Behavior During Therapy: WFL for tasks assessed/performed Overall Cognitive Status: Within Functional Limits for tasks assessed                                 General Comments: Pt pleasant, conversational, motivated to participate t/o session.   General Comments  Pt vitals monitored t/o session. Pt on 3.5 L O2 t/o session.  SpO2 at rest noted to be 95% with drop to 91% during LB dressing task. Pt rebounds to 96% with therapeutic rest break and cues to engage in PLB.    Exercises Other Exercises Other Exercises: Pt educated on role of OT in acute setting, falls prevention strategies, energy conservation strategies including pursed lip breathing, activity pacing, and routines modifications to support safety and functional independence during meaningful occupations of daily life. Handout provided to support recall and carryover of education provided.   Shoulder Instructions      Home Living Family/patient expects to be discharged to:: Private residence Living Arrangements: Spouse/significant other;Children (23 y.o. son) Available Help at Discharge: Family;Available PRN/intermittently (Spouse and Son work during the day.) Type of Home: Mobile home Home Access: Ramped entrance     Tea: One level     Bathroom Shower/Tub: Tub only;Walk-in  shower   Bathroom Toilet: Standard (With riser/handles)     Home Equipment: Walker - 2 wheels;Cane - single point;Shower seat;Toilet riser          Prior Functioning/Environment Level of Independence: Independent        Comments: Pt reports she is independent with all bathing, dressing, and self-care tasks at baseline. She endorses occasionally using her [facility] for longer distances such as grocery shopping/community mobility. She states she is generally limited to household mobility at baseline 2/2 charcot foot. Pt does not drive. Spouse/son assist with IADL management including driving, shopping, etc.        OT Problem List: Cardiopulmonary status limiting activity;Decreased safety awareness;Decreased knowledge of use of DME or AE      OT Treatment/Interventions:      OT Goals(Current goals can be found in the care plan section) Acute Rehab OT Goals Patient Stated Goal: To breathe better and be able to go home. OT Goal Formulation: All assessment and education complete, DC therapy Time For Goal Achievement: 06/15/20 Potential to Achieve Goals: Good  OT Frequency:     Barriers to D/C:  Co-evaluation              AM-PAC OT "6 Clicks" Daily Activity     Outcome Measure Help from another person eating meals?: None Help from another person taking care of personal grooming?: None Help from another person toileting, which includes using toliet, bedpan, or urinal?: None Help from another person bathing (including washing, rinsing, drying)?: None Help from another person to put on and taking off regular upper body clothing?: None Help from another person to put on and taking off regular lower body clothing?: None 6 Click Score: 24   End of Session Equipment Utilized During Treatment: Oxygen  Activity Tolerance: Patient tolerated treatment well Patient left: in bed;with call bell/phone within reach  OT Visit Diagnosis: Other abnormalities of gait and mobility  (R26.89)                Time: [id number] OT Time Calculation (min): 23 min Charges:  OT General Charges $OT Visit: 1 Visit OT Evaluation $OT Eval Moderate Complexity: 1 Mod OT Treatments $Self Care/Home Management : 8-22 mins  Shara Blazing, M.S., OTR/L Ascom: [phone] 06/15/20, 2:15 PM

## 2020-06-15 NOTE — Progress Notes (Signed)
PHARMACIST - PHYSICIAN COMMUNICATION DR:   Renford Dills CONCERNING: Antibiotic IV to Oral Route Change Policy  RECOMMENDATION: This patient is receiving Azithromycin by the intravenous route.  Based on criteria approved by the Pharmacy and Therapeutics Committee, and national shortage of IV product, the antibiotic(s) is/are being converted to the equivalent oral dose form(s).   DESCRIPTION: These criteria include:  Patient being treated for a respiratory tract infection, urinary tract infection, cellulitis or clostridium difficile associated diarrhea if on metronidazole  The patient is not neutropenic and does not exhibit a GI malabsorption state  The patient is eating (either orally or via tube) and/or has been taking other orally administered medications for a least 24 hours  The patient is improving clinically and has a Tmax < 100.5  If you have questions about this conversion, please contact the Pharmacy Department   Sharen Hones, PharmD, BCPS Clinical Pharmacist

## 2020-06-15 NOTE — Progress Notes (Signed)
PROGRESS NOTE    Julia Simmons  EPP:[id number] DOB: 01/24/1961 DOA: 06/13/2020 PCP: Patrice Paradise, MD   Chief Complain: SOB,Cough  Brief Narrative:  Patient is a 59 year old female with history of hypertension, diabetes type 2, morbid obesity, charcoaled foot who presents to the emergency department with complaints of fever, cough for 2 days.  Her cough was nonproductive.  She was found afebrile and hypoxic in the emergency department.  Chest x-ray showed right lower lobe infiltrate.  She needed supplemental oxygen for maintenance of saturation.  Her husband has been recently diagnosed with Covid.  Patient was admitted for the management of communitee acquired pneumonia.  Assessment & Plan:   Principal Problem:   RLL pneumonia Active Problems:   Diabetes mellitus with neuropathy (HCC)   Hypertension   Morbid obesity with BMI of 50.0-59.9, adult (HCC)   Right lower lobe pneumonia: Presented with fever, cough.  She was hypoxic on presentation.  X-ray showed right lower lobe infiltrate.  PCR for Covid/influenza negative.  Sputum culture showing mixed organisms.  Continue on azithromycin and ceftriaxone.  Currently she is afebrile.  Procalcitonin is nonreassuring.  Continue cough medications.  Acute hypoxic respiratory failure: Continue supplemental oxygen if needed but try to wean and monitor on room air.  Hypertension: Currently blood pressure stable.  Continue current medications  Diabetes type 2: On insulin at home.  Current insulin regimen to be continued, diabetic coordinator was  following.  Hemoglobin 9.3.  Monitor blood sugars.  Hyperlipidemia: Continue Pravachol  Charcot's deformity of right plantar foot: Wound care consulted.  She has an open wound on the right plantar foot which is chronic and she follows with podiatry as an  Outpatient. PT/OT consulted.  Morbid obesity: BMI 52.8         DVT prophylaxis:Lovenox Code Status: Full Family Communication: None  at the bedside Status is: Inpatient  Remains inpatient appropriate because:Inpatient level of care appropriate due to severity of illness   Dispo: The patient is from: Home              Anticipated d/c is to: Home              Anticipated d/c date is: 1 day              Patient currently is not medically stable to d/c. Patient is not on oxygen at home.  Currently requiring supplemental oxygen.  Needs to be free of oxygen before discharge.    Consultants: None  Procedures: None  Antimicrobials:  Anti-infectives (From admission, onward)   Start     Dose/Rate Route Frequency Ordered Stop   06/15/20 1500  azithromycin (ZITHROMAX) tablet 500 mg        500 mg Oral Every 24 hours 06/15/20 0930 06/18/20 1459   06/14/20 1000  cefTRIAXone (ROCEPHIN) 2 g in sodium chloride 0.9 % 100 mL IVPB        2 g 200 mL/hr over 30 Minutes Intravenous Every 24 hours 06/13/20 1344 06/18/20 0959   06/13/20 1500  azithromycin (ZITHROMAX) 500 mg in sodium chloride 0.9 % 250 mL IVPB  Status:  Discontinued        500 mg 250 mL/hr over 60 Minutes Intravenous Every 24 hours 06/13/20 1344 06/15/20 0930   06/13/20 0900  cefTRIAXone (ROCEPHIN) 1 g in sodium chloride 0.9 % 100 mL IVPB        1 g 200 mL/hr over 30 Minutes Intravenous  Once 06/13/20 0851 06/13/20 1232  Subjective: Patient seen and examined at bedside this morning. Respiratory sitting on the chair.  Looks weak, on 3 days of oxygen per minute.  Was not in any kind of respiratory distress but continues to complain of some cough.  She says she is feeling better  Objective: Vitals:   06/15/20 0004 06/15/20 0529 06/15/20 0734 06/15/20 1157  BP: 140/64 (!) 152/65 (!) 147/60 (!) 149/76  Pulse: 78 71 70 65  Resp: 18 18 19    Temp: 98.6 F (37 C) 98.6 F (37 C) 99.2 F (37.3 C) 98.2 F (36.8 C)  TempSrc: Oral Oral Oral Oral  SpO2: 95% 95% 94% 95%  Weight:      Height:        Intake/Output Summary (Last 24 hours) at 06/15/2020 1241 Last  data filed at 06/15/2020 1032 Gross per 24 hour  Intake 830 ml  Output --  Net 830 ml   Filed Weights   06/13/20 0703 06/13/20 1358  Weight: (!) 145.2 kg (!) 148.6 kg    Examination:  General exam: Appears calm and comfortable ,Not in distress,morbidly obese HEENT:PERRL,Oral mucosa moist, Ear/Nose normal on gross exam Respiratory system: Bilateral equal air entry, normal vesicular breath sounds,no obvious  no wheezes or crackles  Cardiovascular system: S1 & S2 heard, RRR. No JVD, murmurs, rubs, gallops or clicks. No pedal edema. Gastrointestinal system: Abdomen is nondistended, soft and nontender. No organomegaly or masses felt. Normal bowel sounds heard. Central nervous system: Alert and oriented. No focal neurological deficits. Extremities: No edema, no clubbing ,no cyanosis, wound on the right leg Skin: No rashes, ,no icterus ,no pallor   Data Reviewed: I have personally reviewed following labs and imaging studies  CBC: Recent Labs  Lab 06/13/20 1110 06/14/20 0440 06/15/20 0401  WBC 6.2 5.8 5.2  NEUTROABS 4.1 3.5  --   HGB 13.2 12.9 12.3  HCT 40.5 40.1 36.3  MCV 91.4 91.8 89.2  PLT 273 247 247   Basic Metabolic Panel: Recent Labs  Lab 06/13/20 1110 06/14/20 0440 06/15/20 0401  NA 138 136 138  K 5.0 3.7 3.7  CL 103 101 102  CO2 25 26 26   GLUCOSE 251* 290* 221*  BUN 17 17 18   CREATININE 0.76 0.87 0.81  CALCIUM 8.8* 8.5* 8.6*  MG  --   --  1.8  PHOS  --   --  2.9   GFR: Estimated Creatinine Clearance: 114.9 mL/min (by C-G formula based on SCr of 0.81 mg/dL). Liver Function Tests: Recent Labs  Lab 06/13/20 1110  AST 19  ALT 17  ALKPHOS 61  BILITOT 0.5  PROT 7.7  ALBUMIN 3.8   No results for input(s): LIPASE, AMYLASE in the last 168 hours. No results for input(s): AMMONIA in the last 168 hours. Coagulation Profile: No results for input(s): INR, PROTIME in the last 168 hours. Cardiac Enzymes: No results for input(s): CKTOTAL, CKMB, CKMBINDEX,  TROPONINI in the last 168 hours. BNP (last 3 results) No results for input(s): PROBNP in the last 8760 hours. HbA1C: Recent Labs    06/13/20 1418  HGBA1C 9.3*   CBG: Recent Labs  Lab 06/14/20 1209 06/14/20 1612 06/14/20 2101 06/15/20 0806 06/15/20 1157  GLUCAP 315* 301* 258* 226* 285*   Lipid Profile: No results for input(s): CHOL, HDL, LDLCALC, TRIG, CHOLHDL, LDLDIRECT in the last 72 hours. Thyroid Function Tests: No results for input(s): TSH, T4TOTAL, FREET4, T3FREE, THYROIDAB in the last 72 hours. Anemia Panel: No results for input(s): VITAMINB12, FOLATE, FERRITIN, TIBC, IRON, RETICCTPCT  in the last 72 hours. Sepsis Labs: Recent Labs  Lab 06/13/20 1110 06/13/20 1927  PROCALCITON  --  0.10  LATICACIDVEN 1.7  --     Recent Results (from the past 240 hour(s))  Resp Panel by RT-PCR (Flu A&B, Covid) Nasopharyngeal Swab     Status: None   Collection Time: 06/13/20  7:23 AM   Specimen: Nasopharyngeal Swab; Nasopharyngeal(NP) swabs in vial transport medium  Result Value Ref Range Status   SARS Coronavirus 2 by RT PCR NEGATIVE NEGATIVE Final    Comment: (NOTE) SARS-CoV-2 target nucleic acids are NOT DETECTED.  The SARS-CoV-2 RNA is generally detectable in upper respiratory specimens during the acute phase of infection. The lowest concentration of SARS-CoV-2 viral copies this assay can detect is 138 copies/mL. A negative result does not preclude SARS-Cov-2 infection and should not be used as the sole basis for treatment or other patient management decisions. A negative result may occur with  improper specimen collection/handling, submission of specimen other than nasopharyngeal swab, presence of viral mutation(s) within the areas targeted by this assay, and inadequate number of viral copies(<138 copies/mL). A negative result must be combined with clinical observations, patient history, and epidemiological information. The expected result is Negative.  Fact Sheet for  Patients:  EntrepreneurPulse.com.au  Fact Sheet for Healthcare Providers:  IncredibleEmployment.be  This test is no t yet approved or cleared by the [place] FDA and  has been authorized for detection and/or diagnosis of SARS-CoV-2 by FDA under an Emergency Use Authorization (EUA). This EUA will remain  in effect (meaning this test can be used) for the duration of the COVID-19 declaration under Section 564(b)(1) of the Act, 21 U.S.C.section 360bbb-3(b)(1), unless the authorization is terminated  or revoked sooner.       Influenza A by PCR NEGATIVE NEGATIVE Final   Influenza B by PCR NEGATIVE NEGATIVE Final    Comment: (NOTE) The Xpert Xpress SARS-CoV-2/FLU/RSV plus assay is intended as an aid in the diagnosis of influenza from Nasopharyngeal swab specimens and should not be used as a sole basis for treatment. Nasal washings and aspirates are unacceptable for Xpert Xpress SARS-CoV-2/FLU/RSV testing.  Fact Sheet for Patients: EntrepreneurPulse.com.au  Fact Sheet for Healthcare Providers: IncredibleEmployment.be  This test is not yet approved or cleared by the [place] FDA and has been authorized for detection and/or diagnosis of SARS-CoV-2 by FDA under an Emergency Use Authorization (EUA). This EUA will remain in effect (meaning this test can be used) for the duration of the COVID-19 declaration under Section 564(b)(1) of the Act, 21 U.S.C. section 360bbb-3(b)(1), unless the authorization is terminated or revoked.  Performed at [facility], [address] [place], [place] [address]   Blood culture (routine x 2)     Status: None (Preliminary result)   Collection Time: 06/13/20 11:10 AM   Specimen: BLOOD  Result Value Ref Range Status   Specimen Description BLOOD BLOOD LEFT ARM  Final   Special Requests   Final    BOTTLES DRAWN AEROBIC AND ANAEROBIC Blood Culture adequate volume    Culture   Final    NO GROWTH 2 DAYS Performed at [facility], [address] [place], [place] [address]    Report Status PENDING  Incomplete  Blood culture (routine x 2)     Status: None (Preliminary result)   Collection Time: 06/13/20  2:17 PM   Specimen: BLOOD  Result Value Ref Range Status   Specimen Description BLOOD BLOOD RIGHT HAND  Final  Special Requests   Final    BOTTLES DRAWN AEROBIC AND ANAEROBIC Blood Culture adequate volume   Culture   Final    NO GROWTH 2 DAYS Performed at [facility], [address] [place], [place] [address]    Report Status PENDING  Incomplete  Culture, sputum-assessment     Status: None   Collection Time: 06/13/20  7:21 PM   Specimen: Expectorated Sputum  Result Value Ref Range Status   Specimen Description EXPECTORATED SPUTUM  Final   Special Requests Normal  Final   Sputum evaluation   Final    THIS SPECIMEN IS ACCEPTABLE FOR SPUTUM CULTURE Performed at [facility], [address] [place], [place] [address]    Report Status 06/13/2020 FINAL  Final  Culture, respiratory     Status: None (Preliminary result)   Collection Time: 06/13/20  7:21 PM  Result Value Ref Range Status   Specimen Description   Final    EXPECTORATED SPUTUM Performed at [facility], [address], [place], [place] [address]    Special Requests   Final    Normal Reflexed from [id number] Performed at [facility], [address] [place], [place] [address]    Gram Stain   Final    FEW WBC PRESENT,BOTH PMN AND MONONUCLEAR RARE GRAM POSITIVE COCCI IN PAIRS RARE GRAM VARIABLE ROD Performed at [facility], [address] [place], [place] [address]    Culture PENDING  Incomplete   Report Status PENDING  Incomplete  SARS CORONAVIRUS 2 (TAT 6-24 HRS) Nasopharyngeal Nasopharyngeal Swab     Status: None   Collection Time: 06/14/20  4:12 PM   Specimen: Nasopharyngeal Swab  Result Value Ref Range Status   SARS  Coronavirus 2 NEGATIVE NEGATIVE Final    Comment: (NOTE) SARS-CoV-2 target nucleic acids are NOT DETECTED.  The SARS-CoV-2 RNA is generally detectable in upper and lower respiratory specimens during the acute phase of infection. Negative results do not preclude SARS-CoV-2 infection, do not rule out co-infections with other pathogens, and should not be used as the sole basis for treatment or other patient management decisions. Negative results must be combined with clinical observations, patient history, and epidemiological information. The expected result is Negative.  Fact Sheet for Patients: HairSlick.no  Fact Sheet for Healthcare Providers: quierodirigir.com  This test is not yet approved or cleared by the [place] FDA and  has been authorized for detection and/or diagnosis of SARS-CoV-2 by FDA under an Emergency Use Authorization (EUA). This EUA will remain  in effect (meaning this test can be used) for the duration of the COVID-19 declaration under Se ction 564(b)(1) of the Act, 21 U.S.C. section 360bbb-3(b)(1), unless the authorization is terminated or revoked sooner.  Performed at [facility], [address], [place], [place] [address]          Radiology Studies: No results found.      Scheduled Meds: . amLODipine  10 mg Oral Daily   And  . benazepril  40 mg Oral Daily  . aspirin EC  81 mg Oral Daily  . azithromycin  500 mg Oral Q24H  . enoxaparin (LOVENOX) injection  0.5 mg/kg Subcutaneous Q24H  . furosemide  40 mg Oral Daily  . insulin aspart  0-20 Units Subcutaneous TID WC  . insulin aspart  0-5 Units Subcutaneous QHS  . insulin aspart  6 Units Subcutaneous TID WC  . insulin detemir  50 Units Subcutaneous Daily  . pravastatin  40 mg Oral QHS   Continuous Infusions: . cefTRIAXone (ROCEPHIN)  IV 2 g (06/15/20 0839)     LOS: 2 days    Time spent:35 mins, More than 50% of that time  was spent in counseling and/or coordination of care.      Burnadette Pop, MD [facility] P12/29/2021, 12:41 PM

## 2020-06-15 NOTE — Progress Notes (Addendum)
Inpatient Diabetes Program Recommendations  AACE/ADA: New Consensus Statement on Inpatient Glycemic Control   Target Ranges:  Prepandial:   less than 140 mg/dL      Peak postprandial:   less than 180 mg/dL (1-2 hours)      Critically ill patients:  140 - 180 mg/dL   Results for CAMBRYN, CHARTERS (MRN [id number]) as of 06/15/2020 06:29  Ref. Range 06/14/2020 08:08 06/14/2020 12:09 06/14/2020 16:12 06/14/2020 21:01  Glucose-Capillary Latest Ref Range: 70 - 99 mg/dL 245 (H) 809 (H) 983 (H) 258 (H)  Results for STAISHA, WINIARSKI (MRN [id number]) as of 06/15/2020 06:29  Ref. Range 06/15/2020 04:01  Glucose Latest Ref Range: 70 - 99 mg/dL 673 (H)   Review of Glycemic Control  Diabetes history: DM2 Outpatient Diabetes medications: Levemir 95 units QHS, Humalog 22 units with supper, Metformin 1000 mg BID, Trulicity 4.5 mg Qweek (Sunday) Current orders for Inpatient glycemic control: Levemir 40 units daily, Novolog 0-20 units TID with meals, Novolog 0-5 units QHS  Inpatient Diabetes Program Recommendations:    Insulin: Please consider increasing Levemir to 50 units daily and Novolog 6 units TID with meals for meal coverage if patient eats at least 50% of meals..  Thanks, Orlando Penner, RN, MSN, CDE Diabetes Coordinator Inpatient Diabetes Program [phone] (Team Pager from 8am to 5pm)

## 2020-06-16 ENCOUNTER — Inpatient Hospital Stay: Payer: Medicare Other

## 2020-06-16 ENCOUNTER — Inpatient Hospital Stay (HOSPITAL_COMMUNITY)
Admit: 2020-06-16 | Discharge: 2020-06-16 | Disposition: A | Discharge status: Home / Self Care | Payer: Medicare Other | Attending: Internal Medicine | Admitting: Internal Medicine

## 2020-06-16 DIAGNOSIS — I361 Nonrheumatic tricuspid (valve) insufficiency: Secondary | ICD-10-CM

## 2020-06-16 DIAGNOSIS — R0600 Dyspnea, unspecified: Secondary | ICD-10-CM

## 2020-06-16 LAB — GLUCOSE, CAPILLARY
Glucose-Capillary: 212 mg/dL — ABNORMAL HIGH (ref 70–99)
Glucose-Capillary: 311 mg/dL — ABNORMAL HIGH (ref 70–99)
Glucose-Capillary: 213 mg/dL — ABNORMAL HIGH (ref 70–99)
Glucose-Capillary: 272 mg/dL — ABNORMAL HIGH (ref 70–99)

## 2020-06-16 LAB — BRAIN NATRIURETIC PEPTIDE: B Natriuretic Peptide: 16.3 pg/mL (ref 0.0–100.0)

## 2020-06-16 MED ORDER — FUROSEMIDE 40 MG PO TABS
40.0000 mg | ORAL_TABLET | Freq: Every day | ORAL | Status: DC
  Administered 2020-06-17: 09:00:00 40 mg via ORAL
  Filled 2020-06-16: qty 1

## 2020-06-16 MED ORDER — INSULIN GLARGINE 100 UNIT/ML ~~LOC~~ SOLN
5.0000 [IU] | Freq: Once | SUBCUTANEOUS | Status: AC
  Administered 2020-06-16: 10:00:00 5 [IU] via SUBCUTANEOUS
  Filled 2020-06-16: qty 0.05

## 2020-06-16 MED ORDER — INSULIN DETEMIR 100 UNIT/ML ~~LOC~~ SOLN
55.0000 [IU] | Freq: Every day | SUBCUTANEOUS | Status: DC
  Administered 2020-06-17: 09:00:00 55 [IU] via SUBCUTANEOUS
  Filled 2020-06-16: qty 0.55

## 2020-06-16 MED ORDER — INSULIN ASPART 100 UNIT/ML ~~LOC~~ SOLN
10.0000 [IU] | Freq: Three times a day (TID) | SUBCUTANEOUS | Status: DC
  Administered 2020-06-16 – 2020-06-17 (×3): 10 [IU] via SUBCUTANEOUS
  Filled 2020-06-16 (×3): qty 1

## 2020-06-16 MED ORDER — FUROSEMIDE 10 MG/ML IJ SOLN
40.0000 mg | Freq: Once | INTRAMUSCULAR | Status: AC
  Administered 2020-06-16: 14:00:00 40 mg via INTRAVENOUS
  Filled 2020-06-16: qty 4

## 2020-06-16 NOTE — Care Management Important Message (Signed)
Important Message  Patient Details  Name: Julia Simmons MRN: [id number] Date of Birth: [birth year]   Medicare Important Message Given:  Yes     [name] A [name] 06/16/2020, 11:16 AM

## 2020-06-16 NOTE — Progress Notes (Signed)
PT Cancellation Note  Patient Details Name: Julia Simmons MRN: [id number] DOB: Jul 14, 1960   Cancelled Treatment:    Reason Eval/Treat Not Completed: Other (comment). Consult received and chart reviewed. Pt currently reports she is at baseline level regarding functional mobility. Pt currently weaned down to 1L of O2 and reports no need for formalized PT consult at this time. Will dc in house and complete order. Please re-order if needs arise.   Elsia Lasota 06/16/2020, 10:02 AM  Elizabeth Palau, PT, DPT [phone]

## 2020-06-16 NOTE — Progress Notes (Signed)
Inpatient Diabetes Program Recommendations  AACE/ADA: New Consensus Statement on Inpatient Glycemic Control   Target Ranges:  Prepandial:   less than 140 mg/dL      Peak postprandial:   less than 180 mg/dL (1-2 hours)      Critically ill patients:  140 - 180 mg/dL   Results for Julia Simmons, Julia Simmons (MRN [id number]) as of 06/16/2020 08:04  Ref. Range 06/15/2020 08:06 06/15/2020 11:57 06/15/2020 15:46 06/15/2020 20:28 06/16/2020 07:23  Glucose-Capillary Latest Ref Range: 70 - 99 mg/dL 917 (H) 915 (H) 056 (H) 222 (H) 212 (H)   Review of Glycemic Control  Diabetes history:DM2 Outpatient Diabetes medications:Levemir 95 units QHS, Humalog 22 units with supper, Metformin 1000 mg BID, Trulicity 4.5 mg Qweek (Sunday) Current orders for Inpatient glycemic control:Levemir 50 units daily, Novolog 0-20 units TID with meals, Novolog 0-5 units QHS, Novolog 6 units TID with meals  Inpatient Diabetes Program Recommendations:  Insulin: Please consider increasing Levemir to 55 units daily and meal coverage to Novolog 10 units TID with meals if patient eats at least 50% of meals.  Thanks, Julia Penner, RN, MSN, CDE Diabetes Coordinator Inpatient Diabetes Program [phone] (Team Pager from 8am to 5pm)

## 2020-06-16 NOTE — Progress Notes (Signed)
PROGRESS NOTE    Julia Simmons  VZC:[id number] DOB: 12/17/1960 DOA: 06/13/2020 PCP: Marinda Elk, MD   Chief Complain: SOB,Cough  Brief Narrative:  Patient is a 59 year old female with history of hypertension, diabetes type 2, morbid obesity, charcoaled foot who presents to the emergency department with complaints of fever, cough for 2 days.  Her cough was nonproductive.  She was found afebrile and hypoxic in the emergency department.  Chest x-ray showed right lower lobe infiltrate.  She needed supplemental oxygen for maintenance of saturation.  Her husband has been recently diagnosed with Covid.  Patient was admitted for the management of communitee acquired pneumonia. Patient's overall status has improved but she is still on 1 L of oxygen per minute and she desaturates with ambulation.  We will continue current antibiotic therapy today.  Also checking BNP, chest x-ray and echo.  Assessment & Plan:   Principal Problem:   RLL pneumonia Active Problems:   Diabetes mellitus with neuropathy ([place])   Hypertension   Morbid obesity with BMI of 50.0-59.9, adult ([place])   Right lower lobe pneumonia: Presented with fever, cough.  She was hypoxic on presentation.  X-ray showed right lower lobe infiltrate.  PCR for Covid/influenza negative.  Sputum culture showing mixed organisms.  Continue on azithromycin and ceftriaxone.  Currently she is afebrile.  Procalcitonin is nonreassuring.  Continue cough medications.  Acute hypoxic respiratory failure: Continue supplemental oxygen if needed but try to wean and monitor on room air. She desaturated when she was put on room air.  Saturating normally on 1 L of oxygen.  She looks mildly volume overloaded on examination, she has crackles on the bases.  Will check BNP, chest x-ray and echo.  Hypertension: Currently blood pressure stable.  Continue current medications  Diabetes type 2: On insulin at home.  Current insulin regimen to be continued,  diabetic coordinator was  following.  Hemoglobin A1c of  9.3.  Monitor blood sugars.  Hyperlipidemia: Continue Pravachol  Charcot's deformity of right plantar foot: Wound care consulted.  She has an open wound on the right plantar foot which is chronic and she follows with podiatry as an  Outpatient.  Morbid obesity: BMI 52.8         DVT prophylaxis:Lovenox Code Status: Full Family Communication: None at the bedside Status is: Inpatient  Remains inpatient appropriate because:Inpatient level of care appropriate due to severity of illness   Dispo: The patient is from: Home              Anticipated d/c is to: Home              Anticipated d/c date is: 1 day              Patient currently is not medically stable to d/c. Patient is not on oxygen at home.  Currently requiring supplemental oxygen.  Needs to be free of oxygen before discharge.    Consultants: None  Procedures: None  Antimicrobials:  Anti-infectives (From admission, onward)   Start     Dose/Rate Route Frequency Ordered Stop   06/15/20 1500  azithromycin (ZITHROMAX) tablet 500 mg        500 mg Oral Every 24 hours 06/15/20 0930 06/18/20 1459   06/14/20 1000  cefTRIAXone (ROCEPHIN) 2 g in sodium chloride 0.9 % 100 mL IVPB        2 g 200 mL/hr over 30 Minutes Intravenous Every 24 hours 06/13/20 1344 06/18/20 0959   06/13/20 1500  azithromycin (ZITHROMAX) 500  mg in sodium chloride 0.9 % 250 mL IVPB  Status:  Discontinued        500 mg 250 mL/hr over 60 Minutes Intravenous Every 24 hours 06/13/20 1344 06/15/20 0930   06/13/20 0900  cefTRIAXone (ROCEPHIN) 1 g in sodium chloride 0.9 % 100 mL IVPB        1 g 200 mL/hr over 30 Minutes Intravenous  Once 06/13/20 0851 06/13/20 1232      Subjective: Patient seen and examined the bedside this morning.  Hemodynamically stable.  As above, she desaturated  without oxygen.  She is very eager to go home.  Objective: Vitals:   06/16/20 0724 06/16/20 0842 06/16/20 0900  06/16/20 0930  BP: (!) 160/79     Pulse: 73     Resp: 18     Temp: 98.3 F (36.8 C)     TempSrc: Oral     SpO2: 94% (!) 87% 95% 94%  Weight:      Height:        Intake/Output Summary (Last 24 hours) at 06/16/2020 1116 Last data filed at 06/16/2020 1031 Gross per 24 hour  Intake 360 ml  Output --  Net 360 ml   Filed Weights   06/13/20 0703 06/13/20 1358  Weight: (!) 145.2 kg (!) 148.6 kg    Examination:   General exam: Morbidly obese, overall comfortable HEENT:PERRL,Oral mucosa moist, Ear/Nose normal on gross exam Respiratory system: Bilateral basal crackles  Cardiovascular system: S1 & S2 heard, RRR. No JVD, murmurs, rubs, gallops or clicks. Gastrointestinal system: Abdomen is nondistended, soft and nontender. No organomegaly or masses felt. Normal bowel sounds heard. Central nervous system: Alert and oriented. No focal neurological deficits. Extremities: No edema, no clubbing ,no cyanosis Skin: No rashes, lesions or ulcers,no icterus ,no pallor   Data Reviewed: I have personally reviewed following labs and imaging studies  CBC: Recent Labs  Lab 06/13/20 1110 06/14/20 0440 06/15/20 0401  WBC 6.2 5.8 5.2  NEUTROABS 4.1 3.5  --   HGB 13.2 12.9 12.3  HCT 40.5 40.1 36.3  MCV 91.4 91.8 89.2  PLT 273 247 247   Basic Metabolic Panel: Recent Labs  Lab 06/13/20 1110 06/14/20 0440 06/15/20 0401  NA 138 136 138  K 5.0 3.7 3.7  CL 103 101 102  CO2 25 26 26   GLUCOSE 251* 290* 221*  BUN 17 17 18   CREATININE 0.76 0.87 0.81  CALCIUM 8.8* 8.5* 8.6*  MG  --   --  1.8  PHOS  --   --  2.9   GFR: Estimated Creatinine Clearance: 114.9 mL/min (by C-G formula based on SCr of 0.81 mg/dL). Liver Function Tests: Recent Labs  Lab 06/13/20 1110  AST 19  ALT 17  ALKPHOS 61  BILITOT 0.5  PROT 7.7  ALBUMIN 3.8   No results for input(s): LIPASE, AMYLASE in the last 168 hours. No results for input(s): AMMONIA in the last 168 hours. Coagulation Profile: No results  for input(s): INR, PROTIME in the last 168 hours. Cardiac Enzymes: No results for input(s): CKTOTAL, CKMB, CKMBINDEX, TROPONINI in the last 168 hours. BNP (last 3 results) No results for input(s): PROBNP in the last 8760 hours. HbA1C: Recent Labs    06/13/20 1418  HGBA1C 9.3*   CBG: Recent Labs  Lab 06/15/20 0806 06/15/20 1157 06/15/20 1546 06/15/20 2028 06/16/20 0723  GLUCAP 226* 285* 338* 222* 212*   Lipid Profile: No results for input(s): CHOL, HDL, LDLCALC, TRIG, CHOLHDL, LDLDIRECT in the last 72 hours.  Thyroid Function Tests: No results for input(s): TSH, T4TOTAL, FREET4, T3FREE, THYROIDAB in the last 72 hours. Anemia Panel: No results for input(s): VITAMINB12, FOLATE, FERRITIN, TIBC, IRON, RETICCTPCT in the last 72 hours. Sepsis Labs: Recent Labs  Lab 06/13/20 1110 06/13/20 1927  PROCALCITON  --  0.10  LATICACIDVEN 1.7  --     Recent Results (from the past 240 hour(s))  Resp Panel by RT-PCR (Flu A&B, Covid) Nasopharyngeal Swab     Status: None   Collection Time: 06/13/20  7:23 AM   Specimen: Nasopharyngeal Swab; Nasopharyngeal(NP) swabs in vial transport medium  Result Value Ref Range Status   SARS Coronavirus 2 by RT PCR NEGATIVE NEGATIVE Final    Comment: (NOTE) SARS-CoV-2 target nucleic acids are NOT DETECTED.  The SARS-CoV-2 RNA is generally detectable in upper respiratory specimens during the acute phase of infection. The lowest concentration of SARS-CoV-2 viral copies this assay can detect is 138 copies/mL. A negative result does not preclude SARS-Cov-2 infection and should not be used as the sole basis for treatment or other patient management decisions. A negative result may occur with  improper specimen collection/handling, submission of specimen other than nasopharyngeal swab, presence of viral mutation(s) within the areas targeted by this assay, and inadequate number of viral copies(<138 copies/mL). A negative result must be combined  with clinical observations, patient history, and epidemiological information. The expected result is Negative.  Fact Sheet for Patients:  BloggerCourse.com  Fact Sheet for Healthcare Providers:  SeriousBroker.it  This test is no t yet approved or cleared by the [place] FDA and  has been authorized for detection and/or diagnosis of SARS-CoV-2 by FDA under an Emergency Use Authorization (EUA). This EUA will remain  in effect (meaning this test can be used) for the duration of the COVID-19 declaration under Section 564(b)(1) of the Act, 21 U.S.C.section 360bbb-3(b)(1), unless the authorization is terminated  or revoked sooner.       Influenza A by PCR NEGATIVE NEGATIVE Final   Influenza B by PCR NEGATIVE NEGATIVE Final    Comment: (NOTE) The Xpert Xpress SARS-CoV-2/FLU/RSV plus assay is intended as an aid in the diagnosis of influenza from Nasopharyngeal swab specimens and should not be used as a sole basis for treatment. Nasal washings and aspirates are unacceptable for Xpert Xpress SARS-CoV-2/FLU/RSV testing.  Fact Sheet for Patients: BloggerCourse.com  Fact Sheet for Healthcare Providers: SeriousBroker.it  This test is not yet approved or cleared by the [place] FDA and has been authorized for detection and/or diagnosis of SARS-CoV-2 by FDA under an Emergency Use Authorization (EUA). This EUA will remain in effect (meaning this test can be used) for the duration of the COVID-19 declaration under Section 564(b)(1) of the Act, 21 U.S.C. section 360bbb-3(b)(1), unless the authorization is terminated or revoked.  Performed at [facility], [address] [place], [place] [address]   Blood culture (routine x 2)     Status: None (Preliminary result)   Collection Time: 06/13/20 11:10 AM   Specimen: BLOOD  Result Value Ref Range Status   Specimen  Description BLOOD BLOOD LEFT ARM  Final   Special Requests   Final    BOTTLES DRAWN AEROBIC AND ANAEROBIC Blood Culture adequate volume   Culture   Final    NO GROWTH 3 DAYS Performed at [facility], [address] [place], [place] [address]    Report Status PENDING  Incomplete  Blood culture (routine x 2)     Status: None (Preliminary result)  Collection Time: 06/13/20  2:17 PM   Specimen: BLOOD  Result Value Ref Range Status   Specimen Description BLOOD BLOOD RIGHT HAND  Final   Special Requests   Final    BOTTLES DRAWN AEROBIC AND ANAEROBIC Blood Culture adequate volume   Culture   Final    NO GROWTH 3 DAYS Performed at [facility], [address] [place], [place] [address]    Report Status PENDING  Incomplete  Culture, sputum-assessment     Status: None   Collection Time: 06/13/20  7:21 PM   Specimen: Expectorated Sputum  Result Value Ref Range Status   Specimen Description EXPECTORATED SPUTUM  Final   Special Requests Normal  Final   Sputum evaluation   Final    THIS SPECIMEN IS ACCEPTABLE FOR SPUTUM CULTURE Performed at [facility], [address] [place], [place] [address]    Report Status 06/13/2020 FINAL  Final  Culture, respiratory     Status: None (Preliminary result)   Collection Time: 06/13/20  7:21 PM  Result Value Ref Range Status   Specimen Description   Final    EXPECTORATED SPUTUM Performed at [facility], [address], [place], [place] [address]    Special Requests   Final    Normal Reflexed from [id number] Performed at [facility], [address] [place], [place] [address]    Gram Stain   Final    FEW WBC PRESENT,BOTH PMN AND MONONUCLEAR RARE GRAM POSITIVE COCCI IN PAIRS RARE GRAM VARIABLE ROD    Culture   Final    CULTURE REINCUBATED FOR BETTER GROWTH Performed at [facility], [address], [place], [place] [address]    Report Status PENDING  Incomplete  SARS CORONAVIRUS 2 (TAT  6-24 HRS) Nasopharyngeal Nasopharyngeal Swab     Status: None   Collection Time: 06/14/20  4:12 PM   Specimen: Nasopharyngeal Swab  Result Value Ref Range Status   SARS Coronavirus 2 NEGATIVE NEGATIVE Final    Comment: (NOTE) SARS-CoV-2 target nucleic acids are NOT DETECTED.  The SARS-CoV-2 RNA is generally detectable in upper and lower respiratory specimens during the acute phase of infection. Negative results do not preclude SARS-CoV-2 infection, do not rule out co-infections with other pathogens, and should not be used as the sole basis for treatment or other patient management decisions. Negative results must be combined with clinical observations, patient history, and epidemiological information. The expected result is Negative.  Fact Sheet for Patients: HairSlick.no  Fact Sheet for Healthcare Providers: quierodirigir.com  This test is not yet approved or cleared by the [place] FDA and  has been authorized for detection and/or diagnosis of SARS-CoV-2 by FDA under an Emergency Use Authorization (EUA). This EUA will remain  in effect (meaning this test can be used) for the duration of the COVID-19 declaration under Se ction 564(b)(1) of the Act, 21 U.S.C. section 360bbb-3(b)(1), unless the authorization is terminated or revoked sooner.  Performed at [facility], [address] [place], [place] [address]          Radiology Studies: No results found.      Scheduled Meds: . amLODipine  10 mg Oral Daily   And  . benazepril  40 mg Oral Daily  . aspirin EC  81 mg Oral Daily  . azithromycin  500 mg Oral Q24H  . enoxaparin (LOVENOX) injection  0.5 mg/kg Subcutaneous Q24H  . furosemide  40 mg Oral Daily  . insulin aspart  0-20 Units Subcutaneous TID WC  . insulin aspart  0-5 Units Subcutaneous QHS  . insulin aspart  10 Units Subcutaneous TID WC  . [START ON 06/17/2020] insulin detemir  55 Units  Subcutaneous Daily  . pravastatin  40 mg Oral QHS   Continuous Infusions: . cefTRIAXone (ROCEPHIN)  IV 2 g (06/15/20 0839)     LOS: 3 days    Time spent:35 mins, More than 50% of that time was spent in counseling and/or coordination of care.      Burnadette Pop, MD [facility] P12/30/2021, 11:16 AM

## 2020-06-17 LAB — ECHOCARDIOGRAM COMPLETE
AR max vel: 2.08 cm2
AV Area VTI: 1.92 cm2
AV Area mean vel: 1.87 cm2
AV Mean grad: 10 mmHg
AV Peak grad: 16.5 mmHg
Ao pk vel: 2.03 m/s
Area-P 1/2: 4.49 cm2
Height: 66 in
S' Lateral: 2.02 cm
Weight: 5241.66 oz

## 2020-06-17 LAB — CULTURE, RESPIRATORY W GRAM STAIN
Culture: NORMAL
Special Requests: NORMAL

## 2020-06-17 LAB — BASIC METABOLIC PANEL
Anion gap: 11 (ref 5–15)
BUN: 21 mg/dL — ABNORMAL HIGH (ref 6–20)
CO2: 25 mmol/L (ref 22–32)
Calcium: 8.9 mg/dL (ref 8.9–10.3)
Chloride: 97 mmol/L — ABNORMAL LOW (ref 98–111)
Creatinine, Ser: 0.8 mg/dL (ref 0.44–1.00)
GFR, Estimated: >60 mL/min (ref >60)
Glucose, Bld: 361 mg/dL — ABNORMAL HIGH (ref 70–99)
Potassium: 4 mmol/L (ref 3.5–5.1)
Sodium: 133 mmol/L — ABNORMAL LOW (ref 135–145)

## 2020-06-17 LAB — GLUCOSE, CAPILLARY: Glucose-Capillary: 343 mg/dL — ABNORMAL HIGH (ref 70–99)

## 2020-06-17 MED ORDER — POLYETHYLENE GLYCOL 3350 17 G PO PACK: 17.0000 g | PACK | Freq: Every day | ORAL | 1 refills | Status: DC | PRN

## 2020-06-17 MED ORDER — AZITHROMYCIN 500 MG PO TABS
500.0000 mg | ORAL_TABLET | Freq: Once | ORAL | Status: AC
  Administered 2020-06-17: 11:00:00 500 mg via ORAL
  Filled 2020-06-17: qty 1

## 2020-06-17 MED ORDER — GUAIFENESIN-DM 100-10 MG/5ML PO SYRP: 10.0000 mL | ORAL_SOLUTION | Freq: Four times a day (QID) | ORAL | 1 refills | Status: DC | PRN

## 2020-06-17 MED ORDER — FUROSEMIDE 40 MG PO TABS: 60.0000 mg | ORAL_TABLET | Freq: Every day | ORAL | 1 refills | Status: DC

## 2020-06-17 MED ORDER — POTASSIUM CHLORIDE ER 20 MEQ PO TBCR: 20.0000 meq | EXTENDED_RELEASE_TABLET | Freq: Every day | ORAL | 1 refills | Status: DC

## 2020-06-17 NOTE — Discharge Summary (Signed)
Physician Discharge Summary  Julia Simmons [id number] DOB: [birth year] DOA: 06/13/2020  PCP: Marinda Elk, MD  Admit date: 06/13/2020 Discharge date: 06/17/2020  Admitted From: Home Disposition:  Home  Discharge Condition:Stable CODE STATUS:FULL Diet recommendation: Heart Healthy  Brief/Interim Summary:  Patient is a 59 year old female with history of hypertension, diabetes type 2, morbid obesity, charcoaled foot who presents to the emergency department with complaints of fever, cough for 2 days.  Her cough was nonproductive.  She was found afebrile and hypoxic in the emergency department.  Chest x-ray showed right lower lobe infiltrate.  She needed supplemental oxygen for maintenance of saturation.  Her husband has been recently diagnosed with Covid.  Patient was admitted for the management of communitee acquired pneumonia.  Or Covid screen test was negative.  Hospital course remarkable for persistent oxygen requirement.  Finally she has been off oxygen and maintaining her saturation.  She was also found to be volume overloaded and was given a dose of IV Lasix.  Patient is medically stable for discharge home today.  Following problems were addressed during hospitalization:  Right lower lobe pneumonia: Presented with fever, cough.  She was hypoxic on presentation.  X-ray showed right lower lobe infiltrate.  PCR for Covid/influenza negative.  Sputum culture showing mixed organisms. She completed the course of antibiotics currently she is afebrile.  Procalcitonin is nonreassuring.  Continue cough medications.  Acute hypoxic respiratory failure:  Currently on room air.  Needed oxygen supplementation during the hospitalization.  Diastolic congestive heart failure: Echocardiogram done here showed ejection fraction of [id number], grade 2 diastolic dysfunction.  She is on Lasix 40 mg at home.  She still looks mildly volume overloaded.  Will increase the dose of Lasix to 60 mg daily.  Follow-up  with PCP in a week for the BMP test.  Hypertension:   Continue current medications  Diabetes type 2: On insulin at home.  Current insulin regimen to be continued, diabetic coordinator was  following.  Hemoglobin A1c of  9.3.    Diabetic coronary recommended to continue home medications  Hyperlipidemia: Continue Pravachol  Charcot's deformity of right plantar foot: Wound care consulted.  She has an open wound on the right plantar foot which is chronic and she follows with podiatry as an  Outpatient.  Morbid obesity: BMI 52.8     Discharge Diagnoses:  Principal Problem:   RLL pneumonia Active Problems:   Diabetes mellitus with neuropathy ([place])   Hypertension   Morbid obesity with BMI of 50.0-59.9, adult [facility])    Discharge Instructions  Discharge Instructions    Diet - low sodium heart healthy   Complete by: As directed    Discharge instructions   Complete by: As directed    1)Please take prescribed medications as instructed 2)Follow up with your PCP in a week. Do a BMP and CBC test in a week   Increase activity slowly   Complete by: As directed    No wound care   Complete by: As directed      Allergies as of 06/17/2020      Reactions   Hydrochlorothiazide Swelling   Facial swelling   Sulfa Antibiotics Swelling   Facial swelling      Medication List    TAKE these medications   amLODipine-benazepril 10-40 MG capsule Commonly known as: LOTREL Take 1 capsule by mouth daily.   aspirin EC 81 MG tablet Take 81 mg by mouth daily.   furosemide 40 MG tablet Commonly known as: LASIX Take 1.5  tablets (60 mg total) by mouth daily. What changed: how much to take   guaiFENesin-dextromethorphan 100-10 MG/5ML syrup Commonly known as: ROBITUSSIN DM Take 10 mLs by mouth every 6 (six) hours as needed for cough.   hydrALAZINE 100 MG tablet Commonly known as: APRESOLINE Take 100 mg by mouth 3 (three) times daily.   insulin detemir 100 UNIT/ML FlexPen Commonly  known as: LEVEMIR Inject 95 Units into the skin daily at 10 pm.   insulin lispro 100 UNIT/ML injection Commonly known as: HUMALOG Inject 22 Units into the skin daily with supper.   metFORMIN 500 MG tablet Commonly known as: GLUCOPHAGE Take 1,000 mg by mouth daily with breakfast.   metFORMIN 500 MG tablet Commonly known as: GLUCOPHAGE Take 1,000 mg by mouth daily with supper.   metoprolol succinate 100 MG 24 hr tablet Commonly known as: TOPROL-XL Take 100 mg by mouth daily.   oxybutynin 10 MG 24 hr tablet Commonly known as: DITROPAN-XL Take 10 mg by mouth daily.   polyethylene glycol 17 g packet Commonly known as: MiraLax Take 17 g by mouth daily as needed for moderate constipation.   Potassium Chloride ER 20 MEQ Tbcr Take 20 mEq by mouth daily. What changed:   medication strength  how much to take   pravastatin 40 MG tablet Commonly known as: PRAVACHOL Take 40 mg by mouth daily.   Trulicity 4.5 0000000 Sopn Generic drug: Dulaglutide Inject 4.5 mg into the skin once a week. Takes once a week on Sunday   Vitamin D (Ergocalciferol) 50 MCG (2000 UT) Caps Take 2,000 Units by mouth daily.       Follow-up Information    Marinda Elk, MD. Schedule an appointment as soon as possible for a visit in 1 week(s).   Specialty: Physician Assistant Contact information: [address] [facility] [place] [address] [phone]              Allergies  Allergen Reactions  . Hydrochlorothiazide Swelling    Facial swelling  . Sulfa Antibiotics Swelling    Facial swelling    Consultations:  None   Procedures/Studies: DG Chest 1 View  Result Date: 06/13/2020 CLINICAL DATA:  Cough and fever EXAM: CHEST  1 VIEW COMPARISON:  None. FINDINGS: There is patchy airspace opacity in the right lower lung region. There is atelectasis in the left mid lung. There is cardiomegaly with pulmonary venous hypertension. No adenopathy no bone lesions.  IMPRESSION: Patchy airspace opacity suspicious for pneumonia in the right lower lung region. Mild left midlung atelectasis. Underlying cardiomegaly with pulmonary vascular congestion. Electronically Signed   By: Lowella Grip III M.D.   On: 06/13/2020 08:17   US RENAL  Result Date: 05/23/2020 CLINICAL DATA:  Poorly controlled hypertension, diabetes. EXAM: RENAL / URINARY TRACT ULTRASOUND COMPLETE COMPARISON:  04/03/2010 CT abdomen pelvis. FINDINGS: Right Kidney: Renal measurements: 13.0 x 6.3 x 6.5 cm = volume: 279 mL. Echogenicity within normal limits. No mass or hydronephrosis visualized. Left Kidney: Renal measurements: 11.2 x 7.1 x 5.4 cm = volume: 224.7 mL. Echogenicity within normal limits. Upper pole cyst measuring 1.9 x 1.7 x 1.4 cm. No hydronephrosis visualized. Bladder: Appears normal for degree of bladder distention. Other: None. IMPRESSION: 1.9 cm left upper pole cyst. Otherwise unremarkable renal ultrasound. Electronically Signed   By: Primitivo Gauze M.D.   On: 05/23/2020 09:54   DG Chest Port 1 View  Result Date: 06/16/2020 CLINICAL DATA:  Inpatient, hypoxia EXAM: PORTABLE CHEST 1 VIEW COMPARISON:  06/13/2020  chest radiograph. FINDINGS: Slightly low lung volumes. Stable cardiomediastinal silhouette with borderline mild cardiomegaly. No pneumothorax. Stable trace left pleural effusion. No right pleural effusion. No overt pulmonary edema. Curvilinear and hazy bibasilar lung opacities, improved. IMPRESSION: 1. Stable borderline mild cardiomegaly. Improved curvilinear and hazy bibasilar lung opacities, favor resolving mild pulmonary edema with residual atelectasis. 2. Stable trace left pleural effusion. Electronically Signed   By: Ilona Sorrel M.D.   On: 06/16/2020 11:40   ECHOCARDIOGRAM COMPLETE  Result Date: 06/17/2020    ECHOCARDIOGRAM REPORT   Patient Name:   Julia Simmons Date of Exam: 06/16/2020 Medical Rec #:  [id number]       Height:       66.0 in Accession #:    [id number]       Weight:       327.6 lb Date of Birth:  1960/12/27        BSA:          2.466 m Patient Age:    19 years        BP:           156/74 mmHg Patient Gender: F               HR:           80 bpm. Exam Location:  [facility] Procedure: 2D Echo Indications:     Dyspnea R06.00  History:         Patient has no prior history of Echocardiogram examinations.                  Risk Factors:Hypertension and Diabetes.  Sonographer:     Avanell Shackleton Referring Phys:  [id number] Jalin Erpelding Diagnosing Phys: Ida Rogue MD IMPRESSIONS  1. Left ventricular ejection fraction, by estimation, is 60 to 65%. The left ventricle has normal function. The left ventricle has no regional wall motion abnormalities. Left ventricular diastolic parameters are consistent with Grade II diastolic dysfunction (pseudonormalization).  2. Right ventricular systolic function is normal. The right ventricular size is normal.  3. Left atrial size was mildly dilated. FINDINGS  Left Ventricle: Left ventricular ejection fraction, by estimation, is 60 to 65%. The left ventricle has normal function. The left ventricle has no regional wall motion abnormalities. The left ventricular internal cavity size was normal in size. There is  no left ventricular hypertrophy. Left ventricular diastolic parameters are consistent with Grade II diastolic dysfunction (pseudonormalization). Right Ventricle: The right ventricular size is normal. No increase in right ventricular wall thickness. Right ventricular systolic function is normal. Left Atrium: Left atrial size was mildly dilated. Right Atrium: Right atrial size was normal in size. Pericardium: There is no evidence of pericardial effusion. Mitral Valve: The mitral valve is normal in structure. No evidence of mitral valve regurgitation. No evidence of mitral valve stenosis. Tricuspid Valve: The tricuspid valve is normal in structure. Tricuspid valve regurgitation is mild . No evidence of tricuspid stenosis. Aortic Valve: The  aortic valve is normal in structure. Aortic valve regurgitation is not visualized. Mild to moderate aortic valve sclerosis/calcification is present, without any evidence of aortic stenosis. Aortic valve mean gradient measures 10.0 mmHg.  Aortic valve peak gradient measures 16.5 mmHg. Aortic valve area, by VTI measures 1.92 cm. Pulmonic Valve: The pulmonic valve was normal in structure. Pulmonic valve regurgitation is not visualized. No evidence of pulmonic stenosis. Aorta: The aortic root is normal in size and structure. Venous: The inferior vena cava is normal in size with greater than 50%  respiratory variability, suggesting right atrial pressure of 3 mmHg. IAS/Shunts: No atrial level shunt detected by color flow Doppler.  LEFT VENTRICLE PLAX 2D LVIDd:         3.62 cm  Diastology LVIDs:         2.02 cm  LV e' medial:    0.09 cm/s LV PW:         0.99 cm  LV E/e' medial:  14.8 LV IVS:        1.26 cm  LV e' lateral:   0.11 cm/s LVOT diam:     2.10 cm  LV E/e' lateral: 11.2 LV SV:         85 LV SV Index:   35 LVOT Area:     3.46 cm  RIGHT VENTRICLE             IVC RV S prime:     13.50 cm/s  IVC diam: 1.81 cm TAPSE (M-mode): 2.9 cm LEFT ATRIUM              Index       RIGHT ATRIUM           Index LA diam:        4.70 cm  1.91 cm/m  RA Area:     13.60 cm LA Vol (A2C):   103.0 ml 41.76 ml/m RA Volume:   27.80 ml  11.27 ml/m LA Vol (A4C):   57.6 ml  23.35 ml/m LA Biplane Vol: 79.3 ml  32.15 ml/m  AORTIC VALVE AV Area (Vmax):    2.08 cm AV Area (Vmean):   1.87 cm AV Area (VTI):     1.92 cm AV Vmax:           203.00 cm/s AV Vmean:          150.000 cm/s AV VTI:            0.443 m AV Peak Grad:      16.5 mmHg AV Mean Grad:      10.0 mmHg LVOT Vmax:         122.00 cm/s LVOT Vmean:        80.800 cm/s LVOT VTI:          0.246 m LVOT/AV VTI ratio: 0.56  AORTA Ao Root diam: 3.30 cm MITRAL VALVE               TRICUSPID VALVE MV Area (PHT): 4.49 cm    TR Peak grad:   25.2 mmHg MV Decel Time: 169 msec    TR Vmax:         251.00 cm/s MV E velocity: 1.27 cm/s MV A velocity: 77.70 cm/s  SHUNTS MV E/A ratio:  0.02        Systemic VTI:  0.25 m                            Systemic Diam: 2.10 cm Ida Rogue MD Electronically signed by Ida Rogue MD Signature Date/Time: 06/17/2020/9:45:12 AM    Final        Subjective: Patient seen and examined at the bedside this morning.  Hemodynamically stable for discharge  Discharge Exam: Vitals:   06/17/20 0837 06/17/20 0900  BP: (!) 151/69   Pulse: 75   Resp: 18   Temp: 97.8 F (36.6 C)   SpO2: 92% 94%   Vitals:   06/17/20 0004 06/17/20 0441 06/17/20 0837 06/17/20 0900  BP: (!) 147/59 Marland Kitchen)  153/55 (!) 151/69   Pulse: 84 72 75   Resp: 20 16 18    Temp: 98.4 F (36.9 C) 98.5 F (36.9 C) 97.8 F (36.6 C)   TempSrc: Oral Oral Oral   SpO2: (!) 88% 90% 92% 94%  Weight:      Height:        General: Pt is alert, awake, not in acute distress Cardiovascular: RRR, S1/S2 +, no rubs, no gallops Respiratory: CTA bilaterally, no wheezing, no rhonchi Abdominal: Soft, NT, ND, bowel sounds + Extremities: Trace bilateral lower extremity  edema, no cyanosis    The results of significant diagnostics from this hospitalization (including imaging, microbiology, ancillary and laboratory) are listed below for reference.     Microbiology: Recent Results (from the past 240 hour(s))  Resp Panel by RT-PCR (Flu A&B, Covid) Nasopharyngeal Swab     Status: None   Collection Time: 06/13/20  7:23 AM   Specimen: Nasopharyngeal Swab; Nasopharyngeal(NP) swabs in vial transport medium  Result Value Ref Range Status   SARS Coronavirus 2 by RT PCR NEGATIVE NEGATIVE Final    Comment: (NOTE) SARS-CoV-2 target nucleic acids are NOT DETECTED.  The SARS-CoV-2 RNA is generally detectable in upper respiratory specimens during the acute phase of infection. The lowest concentration of SARS-CoV-2 viral copies this assay can detect is 138 copies/mL. A negative result does not preclude  SARS-Cov-2 infection and should not be used as the sole basis for treatment or other patient management decisions. A negative result may occur with  improper specimen collection/handling, submission of specimen other than nasopharyngeal swab, presence of viral mutation(s) within the areas targeted by this assay, and inadequate number of viral copies(<138 copies/mL). A negative result must be combined with clinical observations, patient history, and epidemiological information. The expected result is Negative.  Fact Sheet for Patients:  EntrepreneurPulse.com.au  Fact Sheet for Healthcare Providers:  IncredibleEmployment.be  This test is no t yet approved or cleared by the [place] FDA and  has been authorized for detection and/or diagnosis of SARS-CoV-2 by FDA under an Emergency Use Authorization (EUA). This EUA will remain  in effect (meaning this test can be used) for the duration of the COVID-19 declaration under Section 564(b)(1) of the Act, 21 U.S.C.section 360bbb-3(b)(1), unless the authorization is terminated  or revoked sooner.       Influenza A by PCR NEGATIVE NEGATIVE Final   Influenza B by PCR NEGATIVE NEGATIVE Final    Comment: (NOTE) The Xpert Xpress SARS-CoV-2/FLU/RSV plus assay is intended as an aid in the diagnosis of influenza from Nasopharyngeal swab specimens and should not be used as a sole basis for treatment. Nasal washings and aspirates are unacceptable for Xpert Xpress SARS-CoV-2/FLU/RSV testing.  Fact Sheet for Patients: EntrepreneurPulse.com.au  Fact Sheet for Healthcare Providers: IncredibleEmployment.be  This test is not yet approved or cleared by the [place] FDA and has been authorized for detection and/or diagnosis of SARS-CoV-2 by FDA under an Emergency Use Authorization (EUA). This EUA will remain in effect (meaning this test can be used) for the duration of  the COVID-19 declaration under Section 564(b)(1) of the Act, 21 U.S.C. section 360bbb-3(b)(1), unless the authorization is terminated or revoked.  Performed at [facility], [address] [place], [place] [address]   Blood culture (routine x 2)     Status: None (Preliminary result)   Collection Time: 06/13/20 11:10 AM   Specimen: BLOOD  Result Value Ref Range Status   Specimen Description BLOOD BLOOD LEFT ARM  Final  Special Requests   Final    BOTTLES DRAWN AEROBIC AND ANAEROBIC Blood Culture adequate volume   Culture   Final    NO GROWTH 4 DAYS Performed at [facility], [address] [place], [place] [address]    Report Status PENDING  Incomplete  Blood culture (routine x 2)     Status: None (Preliminary result)   Collection Time: 06/13/20  2:17 PM   Specimen: BLOOD  Result Value Ref Range Status   Specimen Description BLOOD BLOOD RIGHT HAND  Final   Special Requests   Final    BOTTLES DRAWN AEROBIC AND ANAEROBIC Blood Culture adequate volume   Culture   Final    NO GROWTH 4 DAYS Performed at [facility], [address] [place], [place] [address]    Report Status PENDING  Incomplete  Culture, sputum-assessment     Status: None   Collection Time: 06/13/20  7:21 PM   Specimen: Expectorated Sputum  Result Value Ref Range Status   Specimen Description EXPECTORATED SPUTUM  Final   Special Requests Normal  Final   Sputum evaluation   Final    THIS SPECIMEN IS ACCEPTABLE FOR SPUTUM CULTURE Performed at [facility], [address] [place], [place] [address]    Report Status 06/13/2020 FINAL  Final  Culture, respiratory     Status: None   Collection Time: 06/13/20  7:21 PM  Result Value Ref Range Status   Specimen Description   Final    EXPECTORATED SPUTUM Performed at [facility], [address], [place], [place] [address]    Special Requests   Final    Normal Reflexed from [phone] Performed at [facility], [place], [place] [address]    Gram Stain   Final    FEW WBC PRESENT,BOTH PMN AND MONONUCLEAR RARE GRAM POSITIVE COCCI IN PAIRS RARE GRAM VARIABLE ROD    Culture   Final    Normal respiratory flora-no Staph aureus or Pseudomonas seen Performed at [facility], [place] [address], [place], [place] [address]    Report Status 06/17/2020 FINAL  Final  SARS CORONAVIRUS 2 (TAT 6-24 HRS) Nasopharyngeal Nasopharyngeal Swab     Status: None   Collection Time: 06/14/20  4:12 PM   Specimen: Nasopharyngeal Swab  Result Value Ref Range Status   SARS Coronavirus 2 NEGATIVE NEGATIVE Final    Comment: (NOTE) SARS-CoV-2 target nucleic acids are NOT DETECTED.  The SARS-CoV-2 RNA is generally detectable in upper and lower respiratory specimens during the acute phase of infection. Negative results do not preclude SARS-CoV-2 infection, do not rule out co-infections with other pathogens, and should not be used as the sole basis for treatment or other patient management decisions. Negative results must be combined with clinical observations, patient history, and epidemiological information. The expected result is Negative.  Fact Sheet for Patients: SugarRoll.be  Fact Sheet for Healthcare Providers: https://www.woods-mathews.com/  This test is not yet approved or cleared by the [place] FDA and  has been authorized for detection and/or diagnosis of SARS-CoV-2 by FDA under an Emergency Use Authorization (EUA). This EUA will remain  in effect (meaning this test can be used) for the duration of the COVID-19 declaration under Se ction 564(b)(1) of the Act, 21 U.S.C. section 360bbb-3(b)(1), unless the authorization is terminated or revoked sooner.  Performed at [facility], [place] [address] [place], [place] [address]      Labs: BNP (last 3 results) Recent  Labs    06/13/20 1110 06/16/20 1046  BNP 53.1 [id number]   Basic  Metabolic Panel: Recent Labs  Lab 06/13/20 1110 06/14/20 0440 06/15/20 0401 06/17/20 0845  NA 138 136 138 133*  K 5.0 3.7 3.7 4.0  CL 103 101 102 97*  CO2 25 26 26 25   GLUCOSE 251* 290* 221* 361*  BUN 17 17 18  21*  CREATININE 0.76 0.87 0.81 0.80  CALCIUM 8.8* 8.5* 8.6* 8.9  MG  --   --  1.8  --   PHOS  --   --  2.9  --    Liver Function Tests: Recent Labs  Lab 06/13/20 1110  AST 19  ALT 17  ALKPHOS 61  BILITOT 0.5  PROT 7.7  ALBUMIN 3.8   No results for input(s): LIPASE, AMYLASE in the last 168 hours. No results for input(s): AMMONIA in the last 168 hours. CBC: Recent Labs  Lab 06/13/20 1110 06/14/20 0440 06/15/20 0401  WBC 6.2 5.8 5.2  NEUTROABS 4.1 3.5  --   HGB 13.2 12.9 12.3  HCT 40.5 40.1 36.3  MCV 91.4 91.8 89.2  PLT 273 247 247   Cardiac Enzymes: No results for input(s): CKTOTAL, CKMB, CKMBINDEX, TROPONINI in the last 168 hours. BNP: Invalid input(s): POCBNP CBG: Recent Labs  Lab 06/16/20 0723 06/16/20 1149 06/16/20 1600 06/16/20 1947 06/17/20 0832  GLUCAP 212* 311* 213* 272* 343*   D-Dimer No results for input(s): DDIMER in the last 72 hours. Hgb A1c No results for input(s): HGBA1C in the last 72 hours. Lipid Profile No results for input(s): CHOL, HDL, LDLCALC, TRIG, CHOLHDL, LDLDIRECT in the last 72 hours. Thyroid function studies No results for input(s): TSH, T4TOTAL, T3FREE, THYROIDAB in the last 72 hours.  Invalid input(s): FREET3 Anemia work up No results for input(s): VITAMINB12, FOLATE, FERRITIN, TIBC, IRON, RETICCTPCT in the last 72 hours. Urinalysis No results found for: COLORURINE, APPEARANCEUR, [place], [place], GLUCOSEU, [place], [place], [place], PROTEINUR, UROBILINOGEN, NITRITE, LEUKOCYTESUR Sepsis Labs Invalid input(s): PROCALCITONIN,  WBC,  LACTICIDVEN Microbiology Recent Results (from the past 240 hour(s))  Resp Panel by RT-PCR (Flu A&B, Covid) Nasopharyngeal Swab     Status: None   Collection Time: 06/13/20   7:23 AM   Specimen: Nasopharyngeal Swab; Nasopharyngeal(NP) swabs in vial transport medium  Result Value Ref Range Status   SARS Coronavirus 2 by RT PCR NEGATIVE NEGATIVE Final    Comment: (NOTE) SARS-CoV-2 target nucleic acids are NOT DETECTED.  The SARS-CoV-2 RNA is generally detectable in upper respiratory specimens during the acute phase of infection. The lowest concentration of SARS-CoV-2 viral copies this assay can detect is 138 copies/mL. A negative result does not preclude SARS-Cov-2 infection and should not be used as the sole basis for treatment or other patient management decisions. A negative result may occur with  improper specimen collection/handling, submission of specimen other than nasopharyngeal swab, presence of viral mutation(s) within the areas targeted by this assay, and inadequate number of viral copies(<138 copies/mL). A negative result must be combined with clinical observations, patient history, and epidemiological information. The expected result is Negative.  Fact Sheet for Patients:  EntrepreneurPulse.com.au  Fact Sheet for Healthcare Providers:  IncredibleEmployment.be  This test is no t yet approved or cleared by the [place] FDA and  has been authorized for detection and/or diagnosis of SARS-CoV-2 by FDA under an Emergency Use Authorization (EUA). This EUA will remain  in effect (meaning this test can be used) for the duration of the COVID-19 declaration under Section 564(b)(1)  of the Act, 21 U.S.C.section 360bbb-3(b)(1), unless the authorization is terminated  or revoked sooner.       Influenza A by PCR NEGATIVE NEGATIVE Final   Influenza B by PCR NEGATIVE NEGATIVE Final    Comment: (NOTE) The Xpert Xpress SARS-CoV-2/FLU/RSV plus assay is intended as an aid in the diagnosis of influenza from Nasopharyngeal swab specimens and should not be used as a sole basis for treatment. Nasal washings and aspirates  are unacceptable for Xpert Xpress SARS-CoV-2/FLU/RSV testing.  Fact Sheet for Patients: BloggerCourse.com  Fact Sheet for Healthcare Providers: SeriousBroker.it  This test is not yet approved or cleared by the [place] FDA and has been authorized for detection and/or diagnosis of SARS-CoV-2 by FDA under an Emergency Use Authorization (EUA). This EUA will remain in effect (meaning this test can be used) for the duration of the COVID-19 declaration under Section 564(b)(1) of the Act, 21 U.S.C. section 360bbb-3(b)(1), unless the authorization is terminated or revoked.  Performed at [facility], [address] [place], [place] [address]   Blood culture (routine x 2)     Status: None (Preliminary result)   Collection Time: 06/13/20 11:10 AM   Specimen: BLOOD  Result Value Ref Range Status   Specimen Description BLOOD BLOOD LEFT ARM  Final   Special Requests   Final    BOTTLES DRAWN AEROBIC AND ANAEROBIC Blood Culture adequate volume   Culture   Final    NO GROWTH 4 DAYS Performed at [facility], [address], [place], [place] [address]    Report Status PENDING  Incomplete  Blood culture (routine x 2)     Status: None (Preliminary result)   Collection Time: 06/13/20  2:17 PM   Specimen: BLOOD  Result Value Ref Range Status   Specimen Description BLOOD BLOOD RIGHT HAND  Final   Special Requests   Final    BOTTLES DRAWN AEROBIC AND ANAEROBIC Blood Culture adequate volume   Culture   Final    NO GROWTH 4 DAYS Performed at [facility], [address], [place], [place] [address]    Report Status PENDING  Incomplete  Culture, sputum-assessment     Status: None   Collection Time: 06/13/20  7:21 PM   Specimen: Expectorated Sputum  Result Value Ref Range Status   Specimen Description EXPECTORATED SPUTUM  Final   Special Requests Normal  Final   Sputum evaluation   Final    THIS SPECIMEN IS  ACCEPTABLE FOR SPUTUM CULTURE Performed at [facility], [address] [place], [place] [address]    Report Status 06/13/2020 FINAL  Final  Culture, respiratory     Status: None   Collection Time: 06/13/20  7:21 PM  Result Value Ref Range Status   Specimen Description   Final    EXPECTORATED SPUTUM Performed at [facility], [address] [place], [place] [address]    Special Requests   Final    Normal Reflexed from [id number] Performed at [facility], [address] [place], [place] [address]    Gram Stain   Final    FEW WBC PRESENT,BOTH PMN AND MONONUCLEAR RARE GRAM POSITIVE COCCI IN PAIRS RARE GRAM VARIABLE ROD    Culture   Final    Normal respiratory flora-no Staph aureus or Pseudomonas seen Performed at [facility], [address], [place], [place] [address]    Report Status 06/17/2020 FINAL  Final  SARS CORONAVIRUS 2 (TAT 6-24 HRS) Nasopharyngeal Nasopharyngeal Swab  Status: None   Collection Time: 06/14/20  4:12 PM   Specimen: Nasopharyngeal Swab  Result Value Ref Range Status   SARS Coronavirus 2 NEGATIVE NEGATIVE Final    Comment: (NOTE) SARS-CoV-2 target nucleic acids are NOT DETECTED.  The SARS-CoV-2 RNA is generally detectable in upper and lower respiratory specimens during the acute phase of infection. Negative results do not preclude SARS-CoV-2 infection, do not rule out co-infections with other pathogens, and should not be used as the sole basis for treatment or other patient management decisions. Negative results must be combined with clinical observations, patient history, and epidemiological information. The expected result is Negative.  Fact Sheet for Patients: SugarRoll.be  Fact Sheet for Healthcare Providers: https://www.woods-mathews.com/  This test is not yet approved or cleared by the [place] FDA and  has been authorized for detection and/or diagnosis of  SARS-CoV-2 by FDA under an Emergency Use Authorization (EUA). This EUA will remain  in effect (meaning this test can be used) for the duration of the COVID-19 declaration under Se ction 564(b)(1) of the Act, 21 U.S.C. section 360bbb-3(b)(1), unless the authorization is terminated or revoked sooner.  Performed at [facility], [place] [address], [place], [place] [address]     Please note: You were cared for by a hospitalist during your hospital stay. Once you are discharged, your primary care physician will handle any further medical issues. Please note that NO REFILLS for any discharge medications will be authorized once you are discharged, as it is imperative that you return to your primary care physician (or establish a relationship with a primary care physician if you do not have one) for your post hospital discharge needs so that they can reassess your need for medications and monitor your lab values.    Time coordinating discharge: 40 minutes  SIGNED:   Shelly Coss, MD  [facility] 06/17/2020, 11:23 AM Pager [phone]  If 7PM-7AM, please contact night-coverage [email or web address] Password [email or web address]

## 2020-06-17 NOTE — Progress Notes (Addendum)
Inpatient Diabetes Program Recommendations  AACE/ADA: New Consensus Statement on Inpatient Glycemic Control (2015)  Target Ranges:  Prepandial:   less than 140 mg/dL      Peak postprandial:   less than 180 mg/dL (1-2 hours)      Critically ill patients:  140 - 180 mg/dL   Results for BENELLI, WINTHER (MRN [id number]) as of 06/17/2020 09:53  Ref. Range 06/16/2020 07:23 06/16/2020 11:49 06/16/2020 16:00 06/16/2020 19:47  Glucose-Capillary Latest Ref Range: 70 - 99 mg/dL 983 (H)  13 units NOVOLOG  50 units LEVEMIR  5 units LANTUS  311 (H)  25 units NOVOLOG  213 (H)  17 units NOVOLOG  272 (H)  3 units NOVOLOG    Results for KAELAH, HAYASHI (MRN [id number]) as of 06/17/2020 09:53  Ref. Range 06/17/2020 08:32  Glucose-Capillary Latest Ref Range: 70 - 99 mg/dL 673 (H)  25 units NOVOLOG  55 units LEVEMIR   Home DM Meds: Levemir 95 units QHS       Humalog 22 units with Dinner       Metformin 1000 mg BID       Trulicity 4.5 mg Qweek   Current Orders: Levemir 55 units Daily      Novolog 0-20 units ac/hs       Novolog 10 units TID with meals    Levemir increased yest AM and Novolog Meal Coverage increased yest at 12pm w/ Lunch  Still having significant glucose elevations  MD- Please consider:  1. Increase Levemir to 65 units Daily  Since 55 unit dose already given this AM, please consider ordering Levemir 10 units X 1 dose to be given this AM as well   2. Increase Novolog Meal Coverage to 15 units TID with meals    --Will follow patient during hospitalization--  Ambrose Finland RN, MSN, CDE Diabetes Coordinator Inpatient Glycemic Control Team Team Pager: [phone] (8a-5p)

## 2020-06-17 NOTE — Progress Notes (Signed)
Discharge instructions given and went over with patient at bedside. All questions answered. Patient discharged home with sister. Madlyn Frankel, RN

## 2020-06-18 LAB — CULTURE, BLOOD (ROUTINE X 2)
Culture: NO GROWTH
Culture: NO GROWTH
Special Requests: ADEQUATE
Special Requests: ADEQUATE

## 2020-06-22 DIAGNOSIS — J189 Pneumonia, unspecified organism: Secondary | ICD-10-CM | POA: Diagnosis not present

## 2020-06-22 DIAGNOSIS — R600 Localized edema: Secondary | ICD-10-CM | POA: Diagnosis not present

## 2020-06-22 DIAGNOSIS — E119 Type 2 diabetes mellitus without complications: Secondary | ICD-10-CM | POA: Diagnosis not present

## 2020-07-25 DIAGNOSIS — N3946 Mixed incontinence: Secondary | ICD-10-CM | POA: Diagnosis not present

## 2020-07-25 DIAGNOSIS — E7800 Pure hypercholesterolemia, unspecified: Secondary | ICD-10-CM | POA: Diagnosis not present

## 2020-07-25 DIAGNOSIS — J189 Pneumonia, unspecified organism: Secondary | ICD-10-CM | POA: Diagnosis not present

## 2020-07-25 DIAGNOSIS — Z0000 Encounter for general adult medical examination without abnormal findings: Secondary | ICD-10-CM | POA: Diagnosis not present

## 2020-07-25 DIAGNOSIS — I10 Essential (primary) hypertension: Secondary | ICD-10-CM | POA: Diagnosis not present

## 2020-07-25 DIAGNOSIS — Z6841 Body Mass Index (BMI) 40.0 and over, adult: Secondary | ICD-10-CM | POA: Diagnosis not present

## 2020-07-25 DIAGNOSIS — E119 Type 2 diabetes mellitus without complications: Secondary | ICD-10-CM | POA: Diagnosis not present

## 2020-08-01 DIAGNOSIS — I10 Essential (primary) hypertension: Secondary | ICD-10-CM | POA: Diagnosis not present

## 2020-08-01 DIAGNOSIS — E119 Type 2 diabetes mellitus without complications: Secondary | ICD-10-CM | POA: Diagnosis not present

## 2020-08-01 DIAGNOSIS — E7800 Pure hypercholesterolemia, unspecified: Secondary | ICD-10-CM | POA: Diagnosis not present

## 2020-08-08 DIAGNOSIS — E785 Hyperlipidemia, unspecified: Secondary | ICD-10-CM | POA: Diagnosis not present

## 2020-08-08 DIAGNOSIS — E1165 Type 2 diabetes mellitus with hyperglycemia: Secondary | ICD-10-CM | POA: Diagnosis not present

## 2020-08-08 DIAGNOSIS — E1169 Type 2 diabetes mellitus with other specified complication: Secondary | ICD-10-CM | POA: Diagnosis not present

## 2020-08-08 DIAGNOSIS — Z794 Long term (current) use of insulin: Secondary | ICD-10-CM | POA: Diagnosis not present

## 2020-08-08 DIAGNOSIS — E113293 Type 2 diabetes mellitus with mild nonproliferative diabetic retinopathy without macular edema, bilateral: Secondary | ICD-10-CM | POA: Diagnosis not present

## 2020-08-08 DIAGNOSIS — E1142 Type 2 diabetes mellitus with diabetic polyneuropathy: Secondary | ICD-10-CM | POA: Diagnosis not present

## 2020-08-08 DIAGNOSIS — E669 Obesity, unspecified: Secondary | ICD-10-CM | POA: Diagnosis not present

## 2020-08-08 DIAGNOSIS — E11610 Type 2 diabetes mellitus with diabetic neuropathic arthropathy: Secondary | ICD-10-CM | POA: Diagnosis not present

## 2020-10-31 DIAGNOSIS — E1165 Type 2 diabetes mellitus with hyperglycemia: Secondary | ICD-10-CM | POA: Diagnosis not present

## 2020-11-07 DIAGNOSIS — E11621 Type 2 diabetes mellitus with foot ulcer: Secondary | ICD-10-CM | POA: Diagnosis not present

## 2020-11-07 DIAGNOSIS — L97509 Non-pressure chronic ulcer of other part of unspecified foot with unspecified severity: Secondary | ICD-10-CM | POA: Diagnosis not present

## 2020-11-07 DIAGNOSIS — Z794 Long term (current) use of insulin: Secondary | ICD-10-CM | POA: Diagnosis not present

## 2020-11-07 DIAGNOSIS — E113293 Type 2 diabetes mellitus with mild nonproliferative diabetic retinopathy without macular edema, bilateral: Secondary | ICD-10-CM | POA: Diagnosis not present

## 2020-11-07 DIAGNOSIS — E785 Hyperlipidemia, unspecified: Secondary | ICD-10-CM | POA: Diagnosis not present

## 2020-11-07 DIAGNOSIS — I10 Essential (primary) hypertension: Secondary | ICD-10-CM | POA: Diagnosis not present

## 2020-11-07 DIAGNOSIS — E11610 Type 2 diabetes mellitus with diabetic neuropathic arthropathy: Secondary | ICD-10-CM | POA: Diagnosis not present

## 2020-11-07 DIAGNOSIS — E1142 Type 2 diabetes mellitus with diabetic polyneuropathy: Secondary | ICD-10-CM | POA: Diagnosis not present

## 2020-11-07 DIAGNOSIS — E669 Obesity, unspecified: Secondary | ICD-10-CM | POA: Diagnosis not present

## 2020-11-07 DIAGNOSIS — E1169 Type 2 diabetes mellitus with other specified complication: Secondary | ICD-10-CM | POA: Diagnosis not present

## 2020-11-07 DIAGNOSIS — E1165 Type 2 diabetes mellitus with hyperglycemia: Secondary | ICD-10-CM | POA: Diagnosis not present

## 2020-11-28 DIAGNOSIS — E1165 Type 2 diabetes mellitus with hyperglycemia: Secondary | ICD-10-CM | POA: Diagnosis not present

## 2020-11-28 DIAGNOSIS — E1142 Type 2 diabetes mellitus with diabetic polyneuropathy: Secondary | ICD-10-CM | POA: Diagnosis not present

## 2020-11-28 DIAGNOSIS — E113293 Type 2 diabetes mellitus with mild nonproliferative diabetic retinopathy without macular edema, bilateral: Secondary | ICD-10-CM | POA: Diagnosis not present

## 2020-11-29 DIAGNOSIS — E1142 Type 2 diabetes mellitus with diabetic polyneuropathy: Secondary | ICD-10-CM | POA: Diagnosis not present

## 2020-11-29 DIAGNOSIS — E1165 Type 2 diabetes mellitus with hyperglycemia: Secondary | ICD-10-CM | POA: Diagnosis not present

## 2020-11-29 DIAGNOSIS — E113293 Type 2 diabetes mellitus with mild nonproliferative diabetic retinopathy without macular edema, bilateral: Secondary | ICD-10-CM | POA: Diagnosis not present

## 2021-01-30 DIAGNOSIS — E119 Type 2 diabetes mellitus without complications: Secondary | ICD-10-CM | POA: Diagnosis not present

## 2021-01-30 DIAGNOSIS — E1165 Type 2 diabetes mellitus with hyperglycemia: Secondary | ICD-10-CM | POA: Diagnosis not present

## 2021-01-30 DIAGNOSIS — I10 Essential (primary) hypertension: Secondary | ICD-10-CM | POA: Diagnosis not present

## 2021-01-30 DIAGNOSIS — E7800 Pure hypercholesterolemia, unspecified: Secondary | ICD-10-CM | POA: Diagnosis not present

## 2021-02-06 DIAGNOSIS — J441 Chronic obstructive pulmonary disease with (acute) exacerbation: Secondary | ICD-10-CM | POA: Diagnosis not present

## 2021-02-06 DIAGNOSIS — Z0000 Encounter for general adult medical examination without abnormal findings: Secondary | ICD-10-CM | POA: Diagnosis not present

## 2021-02-06 DIAGNOSIS — Z6841 Body Mass Index (BMI) 40.0 and over, adult: Secondary | ICD-10-CM | POA: Diagnosis not present

## 2021-02-06 DIAGNOSIS — N3946 Mixed incontinence: Secondary | ICD-10-CM | POA: Diagnosis not present

## 2021-02-06 DIAGNOSIS — E119 Type 2 diabetes mellitus without complications: Secondary | ICD-10-CM | POA: Diagnosis not present

## 2021-02-06 DIAGNOSIS — E782 Mixed hyperlipidemia: Secondary | ICD-10-CM | POA: Diagnosis not present

## 2021-02-06 DIAGNOSIS — I10 Essential (primary) hypertension: Secondary | ICD-10-CM | POA: Diagnosis not present

## 2021-02-06 DIAGNOSIS — R600 Localized edema: Secondary | ICD-10-CM | POA: Diagnosis not present

## 2021-02-06 DIAGNOSIS — L97519 Non-pressure chronic ulcer of other part of right foot with unspecified severity: Secondary | ICD-10-CM | POA: Diagnosis not present

## 2021-02-06 DIAGNOSIS — Z1231 Encounter for screening mammogram for malignant neoplasm of breast: Secondary | ICD-10-CM | POA: Diagnosis not present

## 2021-02-13 DIAGNOSIS — E1169 Type 2 diabetes mellitus with other specified complication: Secondary | ICD-10-CM | POA: Diagnosis not present

## 2021-02-13 DIAGNOSIS — E113293 Type 2 diabetes mellitus with mild nonproliferative diabetic retinopathy without macular edema, bilateral: Secondary | ICD-10-CM | POA: Diagnosis not present

## 2021-02-13 DIAGNOSIS — E1165 Type 2 diabetes mellitus with hyperglycemia: Secondary | ICD-10-CM | POA: Diagnosis not present

## 2021-02-13 DIAGNOSIS — E785 Hyperlipidemia, unspecified: Secondary | ICD-10-CM | POA: Diagnosis not present

## 2021-02-13 DIAGNOSIS — R809 Proteinuria, unspecified: Secondary | ICD-10-CM | POA: Diagnosis not present

## 2021-02-13 DIAGNOSIS — Z794 Long term (current) use of insulin: Secondary | ICD-10-CM | POA: Diagnosis not present

## 2021-02-13 DIAGNOSIS — N1831 Chronic kidney disease, stage 3a: Secondary | ICD-10-CM | POA: Diagnosis not present

## 2021-02-13 DIAGNOSIS — E669 Obesity, unspecified: Secondary | ICD-10-CM | POA: Diagnosis not present

## 2021-02-13 DIAGNOSIS — L97411 Non-pressure chronic ulcer of right heel and midfoot limited to breakdown of skin: Secondary | ICD-10-CM | POA: Diagnosis not present

## 2021-02-13 DIAGNOSIS — M79671 Pain in right foot: Secondary | ICD-10-CM | POA: Diagnosis not present

## 2021-02-13 DIAGNOSIS — E1122 Type 2 diabetes mellitus with diabetic chronic kidney disease: Secondary | ICD-10-CM | POA: Diagnosis not present

## 2021-02-13 DIAGNOSIS — E1129 Type 2 diabetes mellitus with other diabetic kidney complication: Secondary | ICD-10-CM | POA: Diagnosis not present

## 2021-02-13 DIAGNOSIS — E1140 Type 2 diabetes mellitus with diabetic neuropathy, unspecified: Secondary | ICD-10-CM | POA: Diagnosis not present

## 2021-02-13 DIAGNOSIS — E1142 Type 2 diabetes mellitus with diabetic polyneuropathy: Secondary | ICD-10-CM | POA: Diagnosis not present

## 2021-02-13 DIAGNOSIS — M14671 Charcot's joint, right ankle and foot: Secondary | ICD-10-CM | POA: Diagnosis not present

## 2021-02-13 DIAGNOSIS — E11610 Type 2 diabetes mellitus with diabetic neuropathic arthropathy: Secondary | ICD-10-CM | POA: Diagnosis not present

## 2021-03-06 DIAGNOSIS — Z794 Long term (current) use of insulin: Secondary | ICD-10-CM | POA: Diagnosis not present

## 2021-03-06 DIAGNOSIS — E1140 Type 2 diabetes mellitus with diabetic neuropathy, unspecified: Secondary | ICD-10-CM | POA: Diagnosis not present

## 2021-03-06 DIAGNOSIS — L97411 Non-pressure chronic ulcer of right heel and midfoot limited to breakdown of skin: Secondary | ICD-10-CM | POA: Diagnosis not present

## 2021-03-06 DIAGNOSIS — M14671 Charcot's joint, right ankle and foot: Secondary | ICD-10-CM | POA: Diagnosis not present

## 2021-03-16 DIAGNOSIS — E113293 Type 2 diabetes mellitus with mild nonproliferative diabetic retinopathy without macular edema, bilateral: Secondary | ICD-10-CM | POA: Diagnosis not present

## 2021-03-16 DIAGNOSIS — E1142 Type 2 diabetes mellitus with diabetic polyneuropathy: Secondary | ICD-10-CM | POA: Diagnosis not present

## 2021-03-16 DIAGNOSIS — E1165 Type 2 diabetes mellitus with hyperglycemia: Secondary | ICD-10-CM | POA: Diagnosis not present

## 2021-04-15 DIAGNOSIS — E113293 Type 2 diabetes mellitus with mild nonproliferative diabetic retinopathy without macular edema, bilateral: Secondary | ICD-10-CM | POA: Diagnosis not present

## 2021-04-15 DIAGNOSIS — E1142 Type 2 diabetes mellitus with diabetic polyneuropathy: Secondary | ICD-10-CM | POA: Diagnosis not present

## 2021-04-15 DIAGNOSIS — E1165 Type 2 diabetes mellitus with hyperglycemia: Secondary | ICD-10-CM | POA: Diagnosis not present

## 2021-04-17 DIAGNOSIS — L97411 Non-pressure chronic ulcer of right heel and midfoot limited to breakdown of skin: Secondary | ICD-10-CM | POA: Diagnosis not present

## 2021-04-17 DIAGNOSIS — Z794 Long term (current) use of insulin: Secondary | ICD-10-CM | POA: Diagnosis not present

## 2021-04-17 DIAGNOSIS — E1140 Type 2 diabetes mellitus with diabetic neuropathy, unspecified: Secondary | ICD-10-CM | POA: Diagnosis not present

## 2021-04-17 DIAGNOSIS — M14671 Charcot's joint, right ankle and foot: Secondary | ICD-10-CM | POA: Diagnosis not present

## 2021-05-16 DIAGNOSIS — E1165 Type 2 diabetes mellitus with hyperglycemia: Secondary | ICD-10-CM | POA: Diagnosis not present

## 2021-05-16 DIAGNOSIS — E1142 Type 2 diabetes mellitus with diabetic polyneuropathy: Secondary | ICD-10-CM | POA: Diagnosis not present

## 2021-05-16 DIAGNOSIS — E113293 Type 2 diabetes mellitus with mild nonproliferative diabetic retinopathy without macular edema, bilateral: Secondary | ICD-10-CM | POA: Diagnosis not present

## 2021-05-22 DIAGNOSIS — E113293 Type 2 diabetes mellitus with mild nonproliferative diabetic retinopathy without macular edema, bilateral: Secondary | ICD-10-CM | POA: Diagnosis not present

## 2021-05-22 DIAGNOSIS — E1142 Type 2 diabetes mellitus with diabetic polyneuropathy: Secondary | ICD-10-CM | POA: Diagnosis not present

## 2021-05-22 DIAGNOSIS — E1165 Type 2 diabetes mellitus with hyperglycemia: Secondary | ICD-10-CM | POA: Diagnosis not present

## 2021-05-22 DIAGNOSIS — Z794 Long term (current) use of insulin: Secondary | ICD-10-CM | POA: Diagnosis not present

## 2021-05-22 DIAGNOSIS — N1831 Chronic kidney disease, stage 3a: Secondary | ICD-10-CM | POA: Diagnosis not present

## 2021-05-22 DIAGNOSIS — E11610 Type 2 diabetes mellitus with diabetic neuropathic arthropathy: Secondary | ICD-10-CM | POA: Diagnosis not present

## 2021-05-22 DIAGNOSIS — E785 Hyperlipidemia, unspecified: Secondary | ICD-10-CM | POA: Diagnosis not present

## 2021-05-22 DIAGNOSIS — E1169 Type 2 diabetes mellitus with other specified complication: Secondary | ICD-10-CM | POA: Diagnosis not present

## 2021-05-22 DIAGNOSIS — E669 Obesity, unspecified: Secondary | ICD-10-CM | POA: Diagnosis not present

## 2021-05-22 DIAGNOSIS — E1122 Type 2 diabetes mellitus with diabetic chronic kidney disease: Secondary | ICD-10-CM | POA: Diagnosis not present

## 2021-05-29 DIAGNOSIS — E1140 Type 2 diabetes mellitus with diabetic neuropathy, unspecified: Secondary | ICD-10-CM | POA: Diagnosis not present

## 2021-05-29 DIAGNOSIS — L97411 Non-pressure chronic ulcer of right heel and midfoot limited to breakdown of skin: Secondary | ICD-10-CM | POA: Diagnosis not present

## 2021-05-29 DIAGNOSIS — Z794 Long term (current) use of insulin: Secondary | ICD-10-CM | POA: Diagnosis not present

## 2021-05-29 DIAGNOSIS — M14671 Charcot's joint, right ankle and foot: Secondary | ICD-10-CM | POA: Diagnosis not present

## 2021-06-15 DIAGNOSIS — E1142 Type 2 diabetes mellitus with diabetic polyneuropathy: Secondary | ICD-10-CM | POA: Diagnosis not present

## 2021-06-15 DIAGNOSIS — E113293 Type 2 diabetes mellitus with mild nonproliferative diabetic retinopathy without macular edema, bilateral: Secondary | ICD-10-CM | POA: Diagnosis not present

## 2021-06-15 DIAGNOSIS — E1165 Type 2 diabetes mellitus with hyperglycemia: Secondary | ICD-10-CM | POA: Diagnosis not present

## 2021-07-16 DIAGNOSIS — E113293 Type 2 diabetes mellitus with mild nonproliferative diabetic retinopathy without macular edema, bilateral: Secondary | ICD-10-CM | POA: Diagnosis not present

## 2021-07-16 DIAGNOSIS — E1165 Type 2 diabetes mellitus with hyperglycemia: Secondary | ICD-10-CM | POA: Diagnosis not present

## 2021-07-16 DIAGNOSIS — E1142 Type 2 diabetes mellitus with diabetic polyneuropathy: Secondary | ICD-10-CM | POA: Diagnosis not present

## 2021-07-17 DIAGNOSIS — L600 Ingrowing nail: Secondary | ICD-10-CM | POA: Diagnosis not present

## 2021-07-17 DIAGNOSIS — M14671 Charcot's joint, right ankle and foot: Secondary | ICD-10-CM | POA: Diagnosis not present

## 2021-07-17 DIAGNOSIS — B351 Tinea unguium: Secondary | ICD-10-CM | POA: Diagnosis not present

## 2021-07-17 DIAGNOSIS — Z794 Long term (current) use of insulin: Secondary | ICD-10-CM | POA: Diagnosis not present

## 2021-07-17 DIAGNOSIS — L97411 Non-pressure chronic ulcer of right heel and midfoot limited to breakdown of skin: Secondary | ICD-10-CM | POA: Diagnosis not present

## 2021-07-17 DIAGNOSIS — E1140 Type 2 diabetes mellitus with diabetic neuropathy, unspecified: Secondary | ICD-10-CM | POA: Diagnosis not present

## 2021-08-07 DIAGNOSIS — R8290 Unspecified abnormal findings in urine: Secondary | ICD-10-CM | POA: Diagnosis not present

## 2021-08-07 DIAGNOSIS — I10 Essential (primary) hypertension: Secondary | ICD-10-CM | POA: Diagnosis not present

## 2021-08-07 DIAGNOSIS — E782 Mixed hyperlipidemia: Secondary | ICD-10-CM | POA: Diagnosis not present

## 2021-08-07 DIAGNOSIS — E119 Type 2 diabetes mellitus without complications: Secondary | ICD-10-CM | POA: Diagnosis not present

## 2021-08-14 ENCOUNTER — Other Ambulatory Visit: Payer: Self-pay | Admitting: Physician Assistant

## 2021-08-14 DIAGNOSIS — E782 Mixed hyperlipidemia: Secondary | ICD-10-CM | POA: Diagnosis not present

## 2021-08-14 DIAGNOSIS — Z1231 Encounter for screening mammogram for malignant neoplasm of breast: Secondary | ICD-10-CM

## 2021-08-14 DIAGNOSIS — R252 Cramp and spasm: Secondary | ICD-10-CM | POA: Diagnosis not present

## 2021-08-14 DIAGNOSIS — Z0000 Encounter for general adult medical examination without abnormal findings: Secondary | ICD-10-CM | POA: Diagnosis not present

## 2021-08-14 DIAGNOSIS — E1165 Type 2 diabetes mellitus with hyperglycemia: Secondary | ICD-10-CM | POA: Diagnosis not present

## 2021-08-14 DIAGNOSIS — N3946 Mixed incontinence: Secondary | ICD-10-CM | POA: Diagnosis not present

## 2021-08-14 DIAGNOSIS — Z6841 Body Mass Index (BMI) 40.0 and over, adult: Secondary | ICD-10-CM | POA: Diagnosis not present

## 2021-08-14 DIAGNOSIS — I10 Essential (primary) hypertension: Secondary | ICD-10-CM | POA: Diagnosis not present

## 2021-08-15 DIAGNOSIS — E1142 Type 2 diabetes mellitus with diabetic polyneuropathy: Secondary | ICD-10-CM | POA: Diagnosis not present

## 2021-08-15 DIAGNOSIS — E113293 Type 2 diabetes mellitus with mild nonproliferative diabetic retinopathy without macular edema, bilateral: Secondary | ICD-10-CM | POA: Diagnosis not present

## 2021-08-15 DIAGNOSIS — E1165 Type 2 diabetes mellitus with hyperglycemia: Secondary | ICD-10-CM | POA: Diagnosis not present

## 2021-08-28 DIAGNOSIS — E1165 Type 2 diabetes mellitus with hyperglycemia: Secondary | ICD-10-CM | POA: Diagnosis not present

## 2021-09-04 DIAGNOSIS — E1122 Type 2 diabetes mellitus with diabetic chronic kidney disease: Secondary | ICD-10-CM | POA: Diagnosis not present

## 2021-09-04 DIAGNOSIS — N1831 Chronic kidney disease, stage 3a: Secondary | ICD-10-CM | POA: Diagnosis not present

## 2021-09-04 DIAGNOSIS — E11610 Type 2 diabetes mellitus with diabetic neuropathic arthropathy: Secondary | ICD-10-CM | POA: Diagnosis not present

## 2021-09-04 DIAGNOSIS — E1169 Type 2 diabetes mellitus with other specified complication: Secondary | ICD-10-CM | POA: Diagnosis not present

## 2021-09-04 DIAGNOSIS — E113293 Type 2 diabetes mellitus with mild nonproliferative diabetic retinopathy without macular edema, bilateral: Secondary | ICD-10-CM | POA: Diagnosis not present

## 2021-09-04 DIAGNOSIS — E669 Obesity, unspecified: Secondary | ICD-10-CM | POA: Diagnosis not present

## 2021-09-04 DIAGNOSIS — E785 Hyperlipidemia, unspecified: Secondary | ICD-10-CM | POA: Diagnosis not present

## 2021-09-04 DIAGNOSIS — E1142 Type 2 diabetes mellitus with diabetic polyneuropathy: Secondary | ICD-10-CM | POA: Diagnosis not present

## 2021-09-04 DIAGNOSIS — E1165 Type 2 diabetes mellitus with hyperglycemia: Secondary | ICD-10-CM | POA: Diagnosis not present

## 2021-09-04 DIAGNOSIS — E1129 Type 2 diabetes mellitus with other diabetic kidney complication: Secondary | ICD-10-CM | POA: Diagnosis not present

## 2021-09-04 DIAGNOSIS — Z794 Long term (current) use of insulin: Secondary | ICD-10-CM | POA: Diagnosis not present

## 2021-09-12 DIAGNOSIS — E1165 Type 2 diabetes mellitus with hyperglycemia: Secondary | ICD-10-CM | POA: Diagnosis not present

## 2021-09-12 DIAGNOSIS — E113293 Type 2 diabetes mellitus with mild nonproliferative diabetic retinopathy without macular edema, bilateral: Secondary | ICD-10-CM | POA: Diagnosis not present

## 2021-09-12 DIAGNOSIS — E1142 Type 2 diabetes mellitus with diabetic polyneuropathy: Secondary | ICD-10-CM | POA: Diagnosis not present

## 2021-09-25 ENCOUNTER — Ambulatory Visit
Admission: RE | Admit: 2021-09-25 | Discharge: 2021-09-25 | Disposition: A | Discharge status: Home / Self Care | Payer: HMO | Source: Ambulatory Visit | Attending: Physician Assistant | Admitting: Physician Assistant

## 2021-09-25 DIAGNOSIS — Z1231 Encounter for screening mammogram for malignant neoplasm of breast: Secondary | ICD-10-CM

## 2021-10-13 DIAGNOSIS — E1142 Type 2 diabetes mellitus with diabetic polyneuropathy: Secondary | ICD-10-CM | POA: Diagnosis not present

## 2021-10-13 DIAGNOSIS — E113293 Type 2 diabetes mellitus with mild nonproliferative diabetic retinopathy without macular edema, bilateral: Secondary | ICD-10-CM | POA: Diagnosis not present

## 2021-10-13 DIAGNOSIS — E1165 Type 2 diabetes mellitus with hyperglycemia: Secondary | ICD-10-CM | POA: Diagnosis not present

## 2021-11-08 DIAGNOSIS — J441 Chronic obstructive pulmonary disease with (acute) exacerbation: Secondary | ICD-10-CM | POA: Diagnosis not present

## 2021-11-08 DIAGNOSIS — R0602 Shortness of breath: Secondary | ICD-10-CM | POA: Diagnosis not present

## 2021-11-12 DIAGNOSIS — E113293 Type 2 diabetes mellitus with mild nonproliferative diabetic retinopathy without macular edema, bilateral: Secondary | ICD-10-CM | POA: Diagnosis not present

## 2021-11-12 DIAGNOSIS — E1165 Type 2 diabetes mellitus with hyperglycemia: Secondary | ICD-10-CM | POA: Diagnosis not present

## 2021-11-12 DIAGNOSIS — E1142 Type 2 diabetes mellitus with diabetic polyneuropathy: Secondary | ICD-10-CM | POA: Diagnosis not present

## 2021-11-22 ENCOUNTER — Other Ambulatory Visit
Admission: RE | Admit: 2021-11-22 | Discharge: 2021-11-22 | Disposition: A | Discharge status: Home / Self Care | Payer: HMO | Source: Ambulatory Visit | Attending: Family Medicine | Admitting: Family Medicine

## 2021-11-22 DIAGNOSIS — R0600 Dyspnea, unspecified: Secondary | ICD-10-CM | POA: Diagnosis not present

## 2021-11-22 DIAGNOSIS — J90 Pleural effusion, not elsewhere classified: Secondary | ICD-10-CM | POA: Diagnosis not present

## 2021-11-22 DIAGNOSIS — R600 Localized edema: Secondary | ICD-10-CM | POA: Diagnosis not present

## 2021-11-22 LAB — BRAIN NATRIURETIC PEPTIDE: B Natriuretic Peptide: 472.5 pg/mL — ABNORMAL HIGH (ref 0.0–100.0)

## 2021-11-24 ENCOUNTER — Other Ambulatory Visit
Admission: RE | Admit: 2021-11-24 | Discharge: 2021-11-24 | Disposition: A | Discharge status: Home / Self Care | Payer: HMO | Source: Ambulatory Visit | Attending: Family Medicine | Admitting: Family Medicine

## 2021-11-24 DIAGNOSIS — J90 Pleural effusion, not elsewhere classified: Secondary | ICD-10-CM | POA: Diagnosis not present

## 2021-11-24 DIAGNOSIS — R0602 Shortness of breath: Secondary | ICD-10-CM | POA: Diagnosis not present

## 2021-11-24 DIAGNOSIS — M7989 Other specified soft tissue disorders: Secondary | ICD-10-CM | POA: Diagnosis not present

## 2021-11-24 DIAGNOSIS — R7989 Other specified abnormal findings of blood chemistry: Secondary | ICD-10-CM | POA: Diagnosis not present

## 2021-11-24 LAB — BRAIN NATRIURETIC PEPTIDE: B Natriuretic Peptide: 327 pg/mL — ABNORMAL HIGH (ref 0.0–100.0)

## 2021-12-02 DIAGNOSIS — J449 Chronic obstructive pulmonary disease, unspecified: Secondary | ICD-10-CM | POA: Diagnosis not present

## 2021-12-02 DIAGNOSIS — I6523 Occlusion and stenosis of bilateral carotid arteries: Secondary | ICD-10-CM | POA: Diagnosis not present

## 2021-12-02 DIAGNOSIS — R319 Hematuria, unspecified: Secondary | ICD-10-CM | POA: Diagnosis not present

## 2021-12-02 DIAGNOSIS — R0689 Other abnormalities of breathing: Secondary | ICD-10-CM | POA: Diagnosis not present

## 2021-12-02 DIAGNOSIS — E041 Nontoxic single thyroid nodule: Secondary | ICD-10-CM | POA: Diagnosis not present

## 2021-12-02 DIAGNOSIS — S0990XA Unspecified injury of head, initial encounter: Secondary | ICD-10-CM | POA: Diagnosis not present

## 2021-12-02 DIAGNOSIS — I639 Cerebral infarction, unspecified: Secondary | ICD-10-CM | POA: Diagnosis not present

## 2021-12-02 DIAGNOSIS — R4781 Slurred speech: Secondary | ICD-10-CM | POA: Diagnosis not present

## 2021-12-02 DIAGNOSIS — N390 Urinary tract infection, site not specified: Secondary | ICD-10-CM | POA: Diagnosis not present

## 2021-12-03 DIAGNOSIS — M25511 Pain in right shoulder: Secondary | ICD-10-CM | POA: Diagnosis not present

## 2021-12-03 DIAGNOSIS — N179 Acute kidney failure, unspecified: Secondary | ICD-10-CM | POA: Diagnosis not present

## 2021-12-03 DIAGNOSIS — I2584 Coronary atherosclerosis due to calcified coronary lesion: Secondary | ICD-10-CM | POA: Diagnosis not present

## 2021-12-03 DIAGNOSIS — E7800 Pure hypercholesterolemia, unspecified: Secondary | ICD-10-CM | POA: Diagnosis not present

## 2021-12-03 DIAGNOSIS — I110 Hypertensive heart disease with heart failure: Secondary | ICD-10-CM | POA: Diagnosis not present

## 2021-12-03 DIAGNOSIS — I5020 Unspecified systolic (congestive) heart failure: Secondary | ICD-10-CM | POA: Diagnosis not present

## 2021-12-03 DIAGNOSIS — R9430 Abnormal result of cardiovascular function study, unspecified: Secondary | ICD-10-CM | POA: Diagnosis not present

## 2021-12-03 DIAGNOSIS — M160 Bilateral primary osteoarthritis of hip: Secondary | ICD-10-CM | POA: Diagnosis not present

## 2021-12-03 DIAGNOSIS — J984 Other disorders of lung: Secondary | ICD-10-CM | POA: Diagnosis not present

## 2021-12-03 DIAGNOSIS — I214 Non-ST elevation (NSTEMI) myocardial infarction: Secondary | ICD-10-CM | POA: Diagnosis not present

## 2021-12-03 DIAGNOSIS — I517 Cardiomegaly: Secondary | ICD-10-CM | POA: Diagnosis not present

## 2021-12-03 DIAGNOSIS — I63411 Cerebral infarction due to embolism of right middle cerebral artery: Secondary | ICD-10-CM | POA: Diagnosis not present

## 2021-12-03 DIAGNOSIS — J449 Chronic obstructive pulmonary disease, unspecified: Secondary | ICD-10-CM | POA: Diagnosis not present

## 2021-12-03 DIAGNOSIS — Z955 Presence of coronary angioplasty implant and graft: Secondary | ICD-10-CM | POA: Diagnosis not present

## 2021-12-03 DIAGNOSIS — Z9282 Status post administration of tPA (rtPA) in a different facility within the last 24 hours prior to admission to current facility: Secondary | ICD-10-CM | POA: Diagnosis not present

## 2021-12-03 DIAGNOSIS — I2510 Atherosclerotic heart disease of native coronary artery without angina pectoris: Secondary | ICD-10-CM | POA: Diagnosis not present

## 2021-12-03 DIAGNOSIS — R29703 NIHSS score 3: Secondary | ICD-10-CM | POA: Diagnosis not present

## 2021-12-03 DIAGNOSIS — I255 Ischemic cardiomyopathy: Secondary | ICD-10-CM | POA: Diagnosis not present

## 2021-12-03 DIAGNOSIS — E113293 Type 2 diabetes mellitus with mild nonproliferative diabetic retinopathy without macular edema, bilateral: Secondary | ICD-10-CM | POA: Diagnosis not present

## 2021-12-03 DIAGNOSIS — R4781 Slurred speech: Secondary | ICD-10-CM | POA: Diagnosis not present

## 2021-12-03 DIAGNOSIS — J90 Pleural effusion, not elsewhere classified: Secondary | ICD-10-CM | POA: Diagnosis not present

## 2021-12-03 DIAGNOSIS — I6523 Occlusion and stenosis of bilateral carotid arteries: Secondary | ICD-10-CM | POA: Diagnosis not present

## 2021-12-03 DIAGNOSIS — M25551 Pain in right hip: Secondary | ICD-10-CM | POA: Diagnosis not present

## 2021-12-03 DIAGNOSIS — M549 Dorsalgia, unspecified: Secondary | ICD-10-CM | POA: Diagnosis not present

## 2021-12-03 DIAGNOSIS — I639 Cerebral infarction, unspecified: Secondary | ICD-10-CM | POA: Diagnosis not present

## 2021-12-03 DIAGNOSIS — G8194 Hemiplegia, unspecified affecting left nondominant side: Secondary | ICD-10-CM | POA: Diagnosis not present

## 2021-12-03 DIAGNOSIS — R29818 Other symptoms and signs involving the nervous system: Secondary | ICD-10-CM | POA: Diagnosis not present

## 2021-12-03 DIAGNOSIS — G319 Degenerative disease of nervous system, unspecified: Secondary | ICD-10-CM | POA: Diagnosis not present

## 2021-12-03 DIAGNOSIS — F40240 Claustrophobia: Secondary | ICD-10-CM | POA: Diagnosis not present

## 2021-12-03 DIAGNOSIS — I5021 Acute systolic (congestive) heart failure: Secondary | ICD-10-CM | POA: Diagnosis not present

## 2021-12-03 DIAGNOSIS — J9601 Acute respiratory failure with hypoxia: Secondary | ICD-10-CM | POA: Diagnosis not present

## 2021-12-03 DIAGNOSIS — D6489 Other specified anemias: Secondary | ICD-10-CM | POA: Diagnosis not present

## 2021-12-03 DIAGNOSIS — M25552 Pain in left hip: Secondary | ICD-10-CM | POA: Diagnosis not present

## 2021-12-03 DIAGNOSIS — R918 Other nonspecific abnormal finding of lung field: Secondary | ICD-10-CM | POA: Diagnosis not present

## 2021-12-03 DIAGNOSIS — E785 Hyperlipidemia, unspecified: Secondary | ICD-10-CM | POA: Diagnosis not present

## 2021-12-03 DIAGNOSIS — E1142 Type 2 diabetes mellitus with diabetic polyneuropathy: Secondary | ICD-10-CM | POA: Diagnosis not present

## 2021-12-03 DIAGNOSIS — R9082 White matter disease, unspecified: Secondary | ICD-10-CM | POA: Diagnosis not present

## 2021-12-03 DIAGNOSIS — I359 Nonrheumatic aortic valve disorder, unspecified: Secondary | ICD-10-CM | POA: Diagnosis not present

## 2021-12-03 DIAGNOSIS — J9811 Atelectasis: Secondary | ICD-10-CM | POA: Diagnosis not present

## 2021-12-03 DIAGNOSIS — Z6841 Body Mass Index (BMI) 40.0 and over, adult: Secondary | ICD-10-CM | POA: Diagnosis not present

## 2021-12-03 DIAGNOSIS — R0602 Shortness of breath: Secondary | ICD-10-CM | POA: Diagnosis not present

## 2021-12-03 DIAGNOSIS — E1165 Type 2 diabetes mellitus with hyperglycemia: Secondary | ICD-10-CM | POA: Diagnosis not present

## 2021-12-03 DIAGNOSIS — R109 Unspecified abdominal pain: Secondary | ICD-10-CM | POA: Diagnosis not present

## 2021-12-03 DIAGNOSIS — R29702 NIHSS score 2: Secondary | ICD-10-CM | POA: Diagnosis not present

## 2021-12-04 DIAGNOSIS — J984 Other disorders of lung: Secondary | ICD-10-CM | POA: Diagnosis not present

## 2021-12-04 DIAGNOSIS — M160 Bilateral primary osteoarthritis of hip: Secondary | ICD-10-CM | POA: Diagnosis not present

## 2021-12-04 DIAGNOSIS — I517 Cardiomegaly: Secondary | ICD-10-CM | POA: Diagnosis not present

## 2021-12-04 DIAGNOSIS — R29818 Other symptoms and signs involving the nervous system: Secondary | ICD-10-CM | POA: Diagnosis not present

## 2021-12-04 DIAGNOSIS — I639 Cerebral infarction, unspecified: Secondary | ICD-10-CM | POA: Diagnosis not present

## 2021-12-04 DIAGNOSIS — E785 Hyperlipidemia, unspecified: Secondary | ICD-10-CM | POA: Diagnosis not present

## 2021-12-04 DIAGNOSIS — G319 Degenerative disease of nervous system, unspecified: Secondary | ICD-10-CM | POA: Diagnosis not present

## 2021-12-04 DIAGNOSIS — J449 Chronic obstructive pulmonary disease, unspecified: Secondary | ICD-10-CM | POA: Diagnosis not present

## 2021-12-04 DIAGNOSIS — J9601 Acute respiratory failure with hypoxia: Secondary | ICD-10-CM | POA: Diagnosis not present

## 2021-12-04 DIAGNOSIS — I5020 Unspecified systolic (congestive) heart failure: Secondary | ICD-10-CM | POA: Diagnosis not present

## 2021-12-04 DIAGNOSIS — I359 Nonrheumatic aortic valve disorder, unspecified: Secondary | ICD-10-CM | POA: Diagnosis not present

## 2021-12-04 DIAGNOSIS — J90 Pleural effusion, not elsewhere classified: Secondary | ICD-10-CM | POA: Diagnosis not present

## 2021-12-04 DIAGNOSIS — R9082 White matter disease, unspecified: Secondary | ICD-10-CM | POA: Diagnosis not present

## 2021-12-04 DIAGNOSIS — M25511 Pain in right shoulder: Secondary | ICD-10-CM | POA: Diagnosis not present

## 2021-12-04 DIAGNOSIS — E1165 Type 2 diabetes mellitus with hyperglycemia: Secondary | ICD-10-CM | POA: Diagnosis not present

## 2021-12-04 DIAGNOSIS — Z6841 Body Mass Index (BMI) 40.0 and over, adult: Secondary | ICD-10-CM | POA: Diagnosis not present

## 2021-12-05 DIAGNOSIS — J449 Chronic obstructive pulmonary disease, unspecified: Secondary | ICD-10-CM | POA: Diagnosis not present

## 2021-12-05 DIAGNOSIS — E1165 Type 2 diabetes mellitus with hyperglycemia: Secondary | ICD-10-CM | POA: Diagnosis not present

## 2021-12-05 DIAGNOSIS — I639 Cerebral infarction, unspecified: Secondary | ICD-10-CM | POA: Diagnosis not present

## 2021-12-05 DIAGNOSIS — I5020 Unspecified systolic (congestive) heart failure: Secondary | ICD-10-CM | POA: Diagnosis not present

## 2021-12-05 DIAGNOSIS — E785 Hyperlipidemia, unspecified: Secondary | ICD-10-CM | POA: Diagnosis not present

## 2021-12-05 DIAGNOSIS — R0602 Shortness of breath: Secondary | ICD-10-CM | POA: Diagnosis not present

## 2021-12-05 DIAGNOSIS — R29818 Other symptoms and signs involving the nervous system: Secondary | ICD-10-CM | POA: Diagnosis not present

## 2021-12-05 DIAGNOSIS — R918 Other nonspecific abnormal finding of lung field: Secondary | ICD-10-CM | POA: Diagnosis not present

## 2021-12-05 DIAGNOSIS — Z6841 Body Mass Index (BMI) 40.0 and over, adult: Secondary | ICD-10-CM | POA: Diagnosis not present

## 2021-12-05 DIAGNOSIS — J9601 Acute respiratory failure with hypoxia: Secondary | ICD-10-CM | POA: Diagnosis not present

## 2021-12-06 DIAGNOSIS — E1165 Type 2 diabetes mellitus with hyperglycemia: Secondary | ICD-10-CM | POA: Diagnosis not present

## 2021-12-06 DIAGNOSIS — R0602 Shortness of breath: Secondary | ICD-10-CM | POA: Diagnosis not present

## 2021-12-06 DIAGNOSIS — J984 Other disorders of lung: Secondary | ICD-10-CM | POA: Diagnosis not present

## 2021-12-06 DIAGNOSIS — R918 Other nonspecific abnormal finding of lung field: Secondary | ICD-10-CM | POA: Diagnosis not present

## 2021-12-06 DIAGNOSIS — I5020 Unspecified systolic (congestive) heart failure: Secondary | ICD-10-CM | POA: Diagnosis not present

## 2021-12-06 DIAGNOSIS — I517 Cardiomegaly: Secondary | ICD-10-CM | POA: Diagnosis not present

## 2021-12-06 DIAGNOSIS — I639 Cerebral infarction, unspecified: Secondary | ICD-10-CM | POA: Diagnosis not present

## 2021-12-06 DIAGNOSIS — E785 Hyperlipidemia, unspecified: Secondary | ICD-10-CM | POA: Diagnosis not present

## 2021-12-06 DIAGNOSIS — Z6841 Body Mass Index (BMI) 40.0 and over, adult: Secondary | ICD-10-CM | POA: Diagnosis not present

## 2021-12-07 DIAGNOSIS — J9811 Atelectasis: Secondary | ICD-10-CM | POA: Diagnosis not present

## 2021-12-07 DIAGNOSIS — I639 Cerebral infarction, unspecified: Secondary | ICD-10-CM | POA: Diagnosis not present

## 2021-12-07 DIAGNOSIS — E785 Hyperlipidemia, unspecified: Secondary | ICD-10-CM | POA: Diagnosis not present

## 2021-12-07 DIAGNOSIS — Z6841 Body Mass Index (BMI) 40.0 and over, adult: Secondary | ICD-10-CM | POA: Diagnosis not present

## 2021-12-07 DIAGNOSIS — R0602 Shortness of breath: Secondary | ICD-10-CM | POA: Diagnosis not present

## 2021-12-07 DIAGNOSIS — I6523 Occlusion and stenosis of bilateral carotid arteries: Secondary | ICD-10-CM | POA: Diagnosis not present

## 2021-12-07 DIAGNOSIS — E1165 Type 2 diabetes mellitus with hyperglycemia: Secondary | ICD-10-CM | POA: Diagnosis not present

## 2021-12-07 DIAGNOSIS — I5020 Unspecified systolic (congestive) heart failure: Secondary | ICD-10-CM | POA: Diagnosis not present

## 2021-12-08 DIAGNOSIS — E785 Hyperlipidemia, unspecified: Secondary | ICD-10-CM | POA: Diagnosis not present

## 2021-12-08 DIAGNOSIS — I639 Cerebral infarction, unspecified: Secondary | ICD-10-CM | POA: Diagnosis not present

## 2021-12-08 DIAGNOSIS — I5020 Unspecified systolic (congestive) heart failure: Secondary | ICD-10-CM | POA: Diagnosis not present

## 2021-12-08 DIAGNOSIS — E1165 Type 2 diabetes mellitus with hyperglycemia: Secondary | ICD-10-CM | POA: Diagnosis not present

## 2021-12-08 DIAGNOSIS — Z6841 Body Mass Index (BMI) 40.0 and over, adult: Secondary | ICD-10-CM | POA: Diagnosis not present

## 2021-12-08 DIAGNOSIS — I6523 Occlusion and stenosis of bilateral carotid arteries: Secondary | ICD-10-CM | POA: Diagnosis not present

## 2021-12-09 DIAGNOSIS — I5020 Unspecified systolic (congestive) heart failure: Secondary | ICD-10-CM | POA: Diagnosis not present

## 2021-12-09 DIAGNOSIS — I255 Ischemic cardiomyopathy: Secondary | ICD-10-CM | POA: Diagnosis not present

## 2021-12-09 DIAGNOSIS — Z6841 Body Mass Index (BMI) 40.0 and over, adult: Secondary | ICD-10-CM | POA: Diagnosis not present

## 2021-12-09 DIAGNOSIS — I639 Cerebral infarction, unspecified: Secondary | ICD-10-CM | POA: Diagnosis not present

## 2021-12-09 DIAGNOSIS — R109 Unspecified abdominal pain: Secondary | ICD-10-CM | POA: Diagnosis not present

## 2021-12-09 DIAGNOSIS — E785 Hyperlipidemia, unspecified: Secondary | ICD-10-CM | POA: Diagnosis not present

## 2021-12-09 DIAGNOSIS — E1165 Type 2 diabetes mellitus with hyperglycemia: Secondary | ICD-10-CM | POA: Diagnosis not present

## 2021-12-09 DIAGNOSIS — J9601 Acute respiratory failure with hypoxia: Secondary | ICD-10-CM | POA: Diagnosis not present

## 2021-12-10 DIAGNOSIS — J9601 Acute respiratory failure with hypoxia: Secondary | ICD-10-CM | POA: Diagnosis not present

## 2021-12-10 DIAGNOSIS — I639 Cerebral infarction, unspecified: Secondary | ICD-10-CM | POA: Diagnosis not present

## 2021-12-10 DIAGNOSIS — E1165 Type 2 diabetes mellitus with hyperglycemia: Secondary | ICD-10-CM | POA: Diagnosis not present

## 2021-12-10 DIAGNOSIS — I5020 Unspecified systolic (congestive) heart failure: Secondary | ICD-10-CM | POA: Diagnosis not present

## 2021-12-10 DIAGNOSIS — I255 Ischemic cardiomyopathy: Secondary | ICD-10-CM | POA: Diagnosis not present

## 2021-12-10 DIAGNOSIS — Z6841 Body Mass Index (BMI) 40.0 and over, adult: Secondary | ICD-10-CM | POA: Diagnosis not present

## 2021-12-11 DIAGNOSIS — I639 Cerebral infarction, unspecified: Secondary | ICD-10-CM | POA: Diagnosis not present

## 2021-12-11 DIAGNOSIS — I255 Ischemic cardiomyopathy: Secondary | ICD-10-CM | POA: Diagnosis not present

## 2021-12-11 DIAGNOSIS — I63411 Cerebral infarction due to embolism of right middle cerebral artery: Secondary | ICD-10-CM | POA: Diagnosis not present

## 2021-12-11 DIAGNOSIS — Z6841 Body Mass Index (BMI) 40.0 and over, adult: Secondary | ICD-10-CM | POA: Diagnosis not present

## 2021-12-11 DIAGNOSIS — I2510 Atherosclerotic heart disease of native coronary artery without angina pectoris: Secondary | ICD-10-CM | POA: Diagnosis not present

## 2021-12-11 DIAGNOSIS — E1165 Type 2 diabetes mellitus with hyperglycemia: Secondary | ICD-10-CM | POA: Diagnosis not present

## 2021-12-11 DIAGNOSIS — I5020 Unspecified systolic (congestive) heart failure: Secondary | ICD-10-CM | POA: Diagnosis not present

## 2021-12-11 DIAGNOSIS — I5021 Acute systolic (congestive) heart failure: Secondary | ICD-10-CM | POA: Diagnosis not present

## 2021-12-11 DIAGNOSIS — J9601 Acute respiratory failure with hypoxia: Secondary | ICD-10-CM | POA: Diagnosis not present

## 2021-12-12 DIAGNOSIS — R9430 Abnormal result of cardiovascular function study, unspecified: Secondary | ICD-10-CM | POA: Diagnosis not present

## 2021-12-12 DIAGNOSIS — I639 Cerebral infarction, unspecified: Secondary | ICD-10-CM | POA: Diagnosis not present

## 2021-12-12 DIAGNOSIS — I2510 Atherosclerotic heart disease of native coronary artery without angina pectoris: Secondary | ICD-10-CM | POA: Diagnosis not present

## 2021-12-12 DIAGNOSIS — E1165 Type 2 diabetes mellitus with hyperglycemia: Secondary | ICD-10-CM | POA: Diagnosis not present

## 2021-12-12 DIAGNOSIS — I2584 Coronary atherosclerosis due to calcified coronary lesion: Secondary | ICD-10-CM | POA: Diagnosis not present

## 2021-12-12 DIAGNOSIS — I255 Ischemic cardiomyopathy: Secondary | ICD-10-CM | POA: Diagnosis not present

## 2021-12-12 DIAGNOSIS — I63411 Cerebral infarction due to embolism of right middle cerebral artery: Secondary | ICD-10-CM | POA: Diagnosis not present

## 2021-12-12 DIAGNOSIS — I5020 Unspecified systolic (congestive) heart failure: Secondary | ICD-10-CM | POA: Diagnosis not present

## 2021-12-13 DIAGNOSIS — I2584 Coronary atherosclerosis due to calcified coronary lesion: Secondary | ICD-10-CM | POA: Diagnosis not present

## 2021-12-13 DIAGNOSIS — Z955 Presence of coronary angioplasty implant and graft: Secondary | ICD-10-CM | POA: Diagnosis not present

## 2021-12-13 DIAGNOSIS — I255 Ischemic cardiomyopathy: Secondary | ICD-10-CM | POA: Diagnosis not present

## 2021-12-13 DIAGNOSIS — E1165 Type 2 diabetes mellitus with hyperglycemia: Secondary | ICD-10-CM | POA: Diagnosis not present

## 2021-12-13 DIAGNOSIS — E113293 Type 2 diabetes mellitus with mild nonproliferative diabetic retinopathy without macular edema, bilateral: Secondary | ICD-10-CM | POA: Diagnosis not present

## 2021-12-13 DIAGNOSIS — E1142 Type 2 diabetes mellitus with diabetic polyneuropathy: Secondary | ICD-10-CM | POA: Diagnosis not present

## 2021-12-13 DIAGNOSIS — I5021 Acute systolic (congestive) heart failure: Secondary | ICD-10-CM | POA: Diagnosis not present

## 2021-12-13 DIAGNOSIS — I2510 Atherosclerotic heart disease of native coronary artery without angina pectoris: Secondary | ICD-10-CM | POA: Diagnosis not present

## 2021-12-13 DIAGNOSIS — I5020 Unspecified systolic (congestive) heart failure: Secondary | ICD-10-CM | POA: Diagnosis not present

## 2021-12-13 DIAGNOSIS — Z6841 Body Mass Index (BMI) 40.0 and over, adult: Secondary | ICD-10-CM | POA: Diagnosis not present

## 2021-12-13 DIAGNOSIS — I639 Cerebral infarction, unspecified: Secondary | ICD-10-CM | POA: Diagnosis not present

## 2021-12-13 DIAGNOSIS — I63411 Cerebral infarction due to embolism of right middle cerebral artery: Secondary | ICD-10-CM | POA: Diagnosis not present

## 2021-12-14 DIAGNOSIS — I255 Ischemic cardiomyopathy: Secondary | ICD-10-CM | POA: Diagnosis not present

## 2021-12-14 DIAGNOSIS — I639 Cerebral infarction, unspecified: Secondary | ICD-10-CM | POA: Diagnosis not present

## 2021-12-14 DIAGNOSIS — I5020 Unspecified systolic (congestive) heart failure: Secondary | ICD-10-CM | POA: Diagnosis not present

## 2021-12-14 DIAGNOSIS — I63411 Cerebral infarction due to embolism of right middle cerebral artery: Secondary | ICD-10-CM | POA: Diagnosis not present

## 2021-12-14 DIAGNOSIS — E1165 Type 2 diabetes mellitus with hyperglycemia: Secondary | ICD-10-CM | POA: Diagnosis not present

## 2021-12-14 DIAGNOSIS — I5021 Acute systolic (congestive) heart failure: Secondary | ICD-10-CM | POA: Diagnosis not present

## 2021-12-14 DIAGNOSIS — Z6841 Body Mass Index (BMI) 40.0 and over, adult: Secondary | ICD-10-CM | POA: Diagnosis not present

## 2021-12-14 DIAGNOSIS — I2510 Atherosclerotic heart disease of native coronary artery without angina pectoris: Secondary | ICD-10-CM | POA: Diagnosis not present

## 2021-12-14 DIAGNOSIS — Z955 Presence of coronary angioplasty implant and graft: Secondary | ICD-10-CM | POA: Diagnosis not present

## 2021-12-15 DIAGNOSIS — Z955 Presence of coronary angioplasty implant and graft: Secondary | ICD-10-CM | POA: Diagnosis not present

## 2021-12-15 DIAGNOSIS — I63411 Cerebral infarction due to embolism of right middle cerebral artery: Secondary | ICD-10-CM | POA: Diagnosis not present

## 2021-12-15 DIAGNOSIS — I639 Cerebral infarction, unspecified: Secondary | ICD-10-CM | POA: Diagnosis not present

## 2021-12-15 DIAGNOSIS — Z6841 Body Mass Index (BMI) 40.0 and over, adult: Secondary | ICD-10-CM | POA: Diagnosis not present

## 2021-12-15 DIAGNOSIS — E1165 Type 2 diabetes mellitus with hyperglycemia: Secondary | ICD-10-CM | POA: Diagnosis not present

## 2021-12-15 DIAGNOSIS — I255 Ischemic cardiomyopathy: Secondary | ICD-10-CM | POA: Diagnosis not present

## 2021-12-15 DIAGNOSIS — I5020 Unspecified systolic (congestive) heart failure: Secondary | ICD-10-CM | POA: Diagnosis not present

## 2021-12-21 DIAGNOSIS — G4734 Idiopathic sleep related nonobstructive alveolar hypoventilation: Secondary | ICD-10-CM | POA: Diagnosis not present

## 2021-12-21 DIAGNOSIS — I63511 Cerebral infarction due to unspecified occlusion or stenosis of right middle cerebral artery: Secondary | ICD-10-CM | POA: Diagnosis not present

## 2021-12-21 DIAGNOSIS — I509 Heart failure, unspecified: Secondary | ICD-10-CM | POA: Diagnosis not present

## 2021-12-21 DIAGNOSIS — I2510 Atherosclerotic heart disease of native coronary artery without angina pectoris: Secondary | ICD-10-CM | POA: Diagnosis not present

## 2021-12-21 DIAGNOSIS — E119 Type 2 diabetes mellitus without complications: Secondary | ICD-10-CM | POA: Diagnosis not present

## 2021-12-21 DIAGNOSIS — R0683 Snoring: Secondary | ICD-10-CM | POA: Diagnosis not present

## 2021-12-21 DIAGNOSIS — Z794 Long term (current) use of insulin: Secondary | ICD-10-CM | POA: Diagnosis not present

## 2021-12-25 DIAGNOSIS — Z8673 Personal history of transient ischemic attack (TIA), and cerebral infarction without residual deficits: Secondary | ICD-10-CM | POA: Diagnosis not present

## 2021-12-25 DIAGNOSIS — E1142 Type 2 diabetes mellitus with diabetic polyneuropathy: Secondary | ICD-10-CM | POA: Diagnosis not present

## 2021-12-25 DIAGNOSIS — Z9289 Personal history of other medical treatment: Secondary | ICD-10-CM | POA: Diagnosis not present

## 2021-12-25 DIAGNOSIS — E1169 Type 2 diabetes mellitus with other specified complication: Secondary | ICD-10-CM | POA: Diagnosis not present

## 2021-12-25 DIAGNOSIS — E669 Obesity, unspecified: Secondary | ICD-10-CM | POA: Diagnosis not present

## 2021-12-25 DIAGNOSIS — E11610 Type 2 diabetes mellitus with diabetic neuropathic arthropathy: Secondary | ICD-10-CM | POA: Diagnosis not present

## 2021-12-25 DIAGNOSIS — E1122 Type 2 diabetes mellitus with diabetic chronic kidney disease: Secondary | ICD-10-CM | POA: Diagnosis not present

## 2021-12-25 DIAGNOSIS — E785 Hyperlipidemia, unspecified: Secondary | ICD-10-CM | POA: Diagnosis not present

## 2021-12-25 DIAGNOSIS — E113293 Type 2 diabetes mellitus with mild nonproliferative diabetic retinopathy without macular edema, bilateral: Secondary | ICD-10-CM | POA: Diagnosis not present

## 2021-12-25 DIAGNOSIS — E1165 Type 2 diabetes mellitus with hyperglycemia: Secondary | ICD-10-CM | POA: Diagnosis not present

## 2021-12-25 DIAGNOSIS — Z794 Long term (current) use of insulin: Secondary | ICD-10-CM | POA: Diagnosis not present

## 2021-12-27 DIAGNOSIS — Z7984 Long term (current) use of oral hypoglycemic drugs: Secondary | ICD-10-CM | POA: Diagnosis not present

## 2021-12-27 DIAGNOSIS — Z794 Long term (current) use of insulin: Secondary | ICD-10-CM | POA: Diagnosis not present

## 2021-12-27 DIAGNOSIS — I110 Hypertensive heart disease with heart failure: Secondary | ICD-10-CM | POA: Diagnosis not present

## 2021-12-27 DIAGNOSIS — I509 Heart failure, unspecified: Secondary | ICD-10-CM | POA: Diagnosis not present

## 2021-12-27 DIAGNOSIS — E11610 Type 2 diabetes mellitus with diabetic neuropathic arthropathy: Secondary | ICD-10-CM | POA: Diagnosis not present

## 2021-12-27 DIAGNOSIS — I69328 Other speech and language deficits following cerebral infarction: Secondary | ICD-10-CM | POA: Diagnosis not present

## 2021-12-27 DIAGNOSIS — E669 Obesity, unspecified: Secondary | ICD-10-CM | POA: Diagnosis not present

## 2021-12-27 DIAGNOSIS — Z9181 History of falling: Secondary | ICD-10-CM | POA: Diagnosis not present

## 2021-12-27 DIAGNOSIS — Z6841 Body Mass Index (BMI) 40.0 and over, adult: Secondary | ICD-10-CM | POA: Diagnosis not present

## 2021-12-27 DIAGNOSIS — E11319 Type 2 diabetes mellitus with unspecified diabetic retinopathy without macular edema: Secondary | ICD-10-CM | POA: Diagnosis not present

## 2021-12-27 DIAGNOSIS — I69354 Hemiplegia and hemiparesis following cerebral infarction affecting left non-dominant side: Secondary | ICD-10-CM | POA: Diagnosis not present

## 2021-12-27 DIAGNOSIS — H5461 Unqualified visual loss, right eye, normal vision left eye: Secondary | ICD-10-CM | POA: Diagnosis not present

## 2021-12-27 DIAGNOSIS — Z48812 Encounter for surgical aftercare following surgery on the circulatory system: Secondary | ICD-10-CM | POA: Diagnosis not present

## 2021-12-27 DIAGNOSIS — Z7902 Long term (current) use of antithrombotics/antiplatelets: Secondary | ICD-10-CM | POA: Diagnosis not present

## 2021-12-27 DIAGNOSIS — E7800 Pure hypercholesterolemia, unspecified: Secondary | ICD-10-CM | POA: Diagnosis not present

## 2021-12-27 DIAGNOSIS — Z955 Presence of coronary angioplasty implant and graft: Secondary | ICD-10-CM | POA: Diagnosis not present

## 2021-12-27 DIAGNOSIS — Z7982 Long term (current) use of aspirin: Secondary | ICD-10-CM | POA: Diagnosis not present

## 2021-12-27 DIAGNOSIS — I2510 Atherosclerotic heart disease of native coronary artery without angina pectoris: Secondary | ICD-10-CM | POA: Diagnosis not present

## 2022-01-02 DIAGNOSIS — E11319 Type 2 diabetes mellitus with unspecified diabetic retinopathy without macular edema: Secondary | ICD-10-CM | POA: Diagnosis not present

## 2022-01-02 DIAGNOSIS — I509 Heart failure, unspecified: Secondary | ICD-10-CM | POA: Diagnosis not present

## 2022-01-02 DIAGNOSIS — I69328 Other speech and language deficits following cerebral infarction: Secondary | ICD-10-CM | POA: Diagnosis not present

## 2022-01-02 DIAGNOSIS — I2510 Atherosclerotic heart disease of native coronary artery without angina pectoris: Secondary | ICD-10-CM | POA: Diagnosis not present

## 2022-01-02 DIAGNOSIS — E11610 Type 2 diabetes mellitus with diabetic neuropathic arthropathy: Secondary | ICD-10-CM | POA: Diagnosis not present

## 2022-01-02 DIAGNOSIS — Z6841 Body Mass Index (BMI) 40.0 and over, adult: Secondary | ICD-10-CM | POA: Diagnosis not present

## 2022-01-02 DIAGNOSIS — Z955 Presence of coronary angioplasty implant and graft: Secondary | ICD-10-CM | POA: Diagnosis not present

## 2022-01-02 DIAGNOSIS — H5461 Unqualified visual loss, right eye, normal vision left eye: Secondary | ICD-10-CM | POA: Diagnosis not present

## 2022-01-02 DIAGNOSIS — I110 Hypertensive heart disease with heart failure: Secondary | ICD-10-CM | POA: Diagnosis not present

## 2022-01-02 DIAGNOSIS — E669 Obesity, unspecified: Secondary | ICD-10-CM | POA: Diagnosis not present

## 2022-01-02 DIAGNOSIS — Z7982 Long term (current) use of aspirin: Secondary | ICD-10-CM | POA: Diagnosis not present

## 2022-01-02 DIAGNOSIS — I69354 Hemiplegia and hemiparesis following cerebral infarction affecting left non-dominant side: Secondary | ICD-10-CM | POA: Diagnosis not present

## 2022-01-02 DIAGNOSIS — Z794 Long term (current) use of insulin: Secondary | ICD-10-CM | POA: Diagnosis not present

## 2022-01-02 DIAGNOSIS — Z48812 Encounter for surgical aftercare following surgery on the circulatory system: Secondary | ICD-10-CM | POA: Diagnosis not present

## 2022-01-02 DIAGNOSIS — E7800 Pure hypercholesterolemia, unspecified: Secondary | ICD-10-CM | POA: Diagnosis not present

## 2022-01-02 DIAGNOSIS — Z7902 Long term (current) use of antithrombotics/antiplatelets: Secondary | ICD-10-CM | POA: Diagnosis not present

## 2022-01-02 DIAGNOSIS — Z7984 Long term (current) use of oral hypoglycemic drugs: Secondary | ICD-10-CM | POA: Diagnosis not present

## 2022-01-02 DIAGNOSIS — Z9181 History of falling: Secondary | ICD-10-CM | POA: Diagnosis not present

## 2022-01-04 DIAGNOSIS — E113293 Type 2 diabetes mellitus with mild nonproliferative diabetic retinopathy without macular edema, bilateral: Secondary | ICD-10-CM | POA: Diagnosis not present

## 2022-01-04 DIAGNOSIS — I63511 Cerebral infarction due to unspecified occlusion or stenosis of right middle cerebral artery: Secondary | ICD-10-CM | POA: Diagnosis not present

## 2022-01-04 DIAGNOSIS — E119 Type 2 diabetes mellitus without complications: Secondary | ICD-10-CM | POA: Diagnosis not present

## 2022-01-04 DIAGNOSIS — E782 Mixed hyperlipidemia: Secondary | ICD-10-CM | POA: Diagnosis not present

## 2022-01-04 DIAGNOSIS — I10 Essential (primary) hypertension: Secondary | ICD-10-CM | POA: Diagnosis not present

## 2022-01-04 DIAGNOSIS — Z794 Long term (current) use of insulin: Secondary | ICD-10-CM | POA: Diagnosis not present

## 2022-01-04 DIAGNOSIS — I2510 Atherosclerotic heart disease of native coronary artery without angina pectoris: Secondary | ICD-10-CM | POA: Diagnosis not present

## 2022-01-08 DIAGNOSIS — I110 Hypertensive heart disease with heart failure: Secondary | ICD-10-CM | POA: Diagnosis not present

## 2022-01-08 DIAGNOSIS — I69328 Other speech and language deficits following cerebral infarction: Secondary | ICD-10-CM | POA: Diagnosis not present

## 2022-01-08 DIAGNOSIS — I69354 Hemiplegia and hemiparesis following cerebral infarction affecting left non-dominant side: Secondary | ICD-10-CM | POA: Diagnosis not present

## 2022-01-08 DIAGNOSIS — E11610 Type 2 diabetes mellitus with diabetic neuropathic arthropathy: Secondary | ICD-10-CM | POA: Diagnosis not present

## 2022-01-08 DIAGNOSIS — I2510 Atherosclerotic heart disease of native coronary artery without angina pectoris: Secondary | ICD-10-CM | POA: Diagnosis not present

## 2022-01-08 DIAGNOSIS — Z48812 Encounter for surgical aftercare following surgery on the circulatory system: Secondary | ICD-10-CM | POA: Diagnosis not present

## 2022-01-08 DIAGNOSIS — I509 Heart failure, unspecified: Secondary | ICD-10-CM | POA: Diagnosis not present

## 2022-01-16 DIAGNOSIS — I69354 Hemiplegia and hemiparesis following cerebral infarction affecting left non-dominant side: Secondary | ICD-10-CM | POA: Diagnosis not present

## 2022-01-16 DIAGNOSIS — I509 Heart failure, unspecified: Secondary | ICD-10-CM | POA: Diagnosis not present

## 2022-01-16 DIAGNOSIS — Z955 Presence of coronary angioplasty implant and graft: Secondary | ICD-10-CM | POA: Diagnosis not present

## 2022-01-16 DIAGNOSIS — Z48812 Encounter for surgical aftercare following surgery on the circulatory system: Secondary | ICD-10-CM | POA: Diagnosis not present

## 2022-01-16 DIAGNOSIS — E11610 Type 2 diabetes mellitus with diabetic neuropathic arthropathy: Secondary | ICD-10-CM | POA: Diagnosis not present

## 2022-01-16 DIAGNOSIS — Z7982 Long term (current) use of aspirin: Secondary | ICD-10-CM | POA: Diagnosis not present

## 2022-01-16 DIAGNOSIS — Z9181 History of falling: Secondary | ICD-10-CM | POA: Diagnosis not present

## 2022-01-16 DIAGNOSIS — I110 Hypertensive heart disease with heart failure: Secondary | ICD-10-CM | POA: Diagnosis not present

## 2022-01-16 DIAGNOSIS — E669 Obesity, unspecified: Secondary | ICD-10-CM | POA: Diagnosis not present

## 2022-01-16 DIAGNOSIS — Z7902 Long term (current) use of antithrombotics/antiplatelets: Secondary | ICD-10-CM | POA: Diagnosis not present

## 2022-01-16 DIAGNOSIS — I2510 Atherosclerotic heart disease of native coronary artery without angina pectoris: Secondary | ICD-10-CM | POA: Diagnosis not present

## 2022-01-16 DIAGNOSIS — Z7984 Long term (current) use of oral hypoglycemic drugs: Secondary | ICD-10-CM | POA: Diagnosis not present

## 2022-01-16 DIAGNOSIS — H5461 Unqualified visual loss, right eye, normal vision left eye: Secondary | ICD-10-CM | POA: Diagnosis not present

## 2022-01-16 DIAGNOSIS — E11319 Type 2 diabetes mellitus with unspecified diabetic retinopathy without macular edema: Secondary | ICD-10-CM | POA: Diagnosis not present

## 2022-01-16 DIAGNOSIS — Z6841 Body Mass Index (BMI) 40.0 and over, adult: Secondary | ICD-10-CM | POA: Diagnosis not present

## 2022-01-16 DIAGNOSIS — E7800 Pure hypercholesterolemia, unspecified: Secondary | ICD-10-CM | POA: Diagnosis not present

## 2022-01-16 DIAGNOSIS — I69328 Other speech and language deficits following cerebral infarction: Secondary | ICD-10-CM | POA: Diagnosis not present

## 2022-01-16 DIAGNOSIS — Z794 Long term (current) use of insulin: Secondary | ICD-10-CM | POA: Diagnosis not present

## 2022-01-29 DIAGNOSIS — I63411 Cerebral infarction due to embolism of right middle cerebral artery: Secondary | ICD-10-CM | POA: Diagnosis not present

## 2022-01-29 DIAGNOSIS — G4733 Obstructive sleep apnea (adult) (pediatric): Secondary | ICD-10-CM | POA: Diagnosis not present

## 2022-01-29 DIAGNOSIS — I2510 Atherosclerotic heart disease of native coronary artery without angina pectoris: Secondary | ICD-10-CM | POA: Diagnosis not present

## 2022-02-07 ENCOUNTER — Encounter: Payer: HMO | Attending: Physician Assistant | Admitting: Dietician

## 2022-02-07 ENCOUNTER — Encounter: Payer: Self-pay | Admitting: Dietician

## 2022-02-07 VITALS — Ht 65.0 in | Wt 300.0 lb

## 2022-02-07 DIAGNOSIS — I110 Hypertensive heart disease with heart failure: Secondary | ICD-10-CM | POA: Insufficient documentation

## 2022-02-07 DIAGNOSIS — E1140 Type 2 diabetes mellitus with diabetic neuropathy, unspecified: Secondary | ICD-10-CM | POA: Diagnosis not present

## 2022-02-07 DIAGNOSIS — I2510 Atherosclerotic heart disease of native coronary artery without angina pectoris: Secondary | ICD-10-CM | POA: Diagnosis not present

## 2022-02-07 DIAGNOSIS — Z713 Dietary counseling and surveillance: Secondary | ICD-10-CM | POA: Insufficient documentation

## 2022-02-07 DIAGNOSIS — E669 Obesity, unspecified: Secondary | ICD-10-CM | POA: Diagnosis not present

## 2022-02-07 DIAGNOSIS — E7800 Pure hypercholesterolemia, unspecified: Secondary | ICD-10-CM | POA: Diagnosis not present

## 2022-02-07 DIAGNOSIS — I509 Heart failure, unspecified: Secondary | ICD-10-CM

## 2022-02-07 DIAGNOSIS — Z9181 History of falling: Secondary | ICD-10-CM | POA: Diagnosis not present

## 2022-02-07 DIAGNOSIS — H5461 Unqualified visual loss, right eye, normal vision left eye: Secondary | ICD-10-CM | POA: Diagnosis not present

## 2022-02-07 DIAGNOSIS — Z7902 Long term (current) use of antithrombotics/antiplatelets: Secondary | ICD-10-CM | POA: Diagnosis not present

## 2022-02-07 DIAGNOSIS — Z794 Long term (current) use of insulin: Secondary | ICD-10-CM | POA: Diagnosis not present

## 2022-02-07 DIAGNOSIS — I10 Essential (primary) hypertension: Secondary | ICD-10-CM

## 2022-02-07 DIAGNOSIS — Z955 Presence of coronary angioplasty implant and graft: Secondary | ICD-10-CM | POA: Diagnosis not present

## 2022-02-07 DIAGNOSIS — E11319 Type 2 diabetes mellitus with unspecified diabetic retinopathy without macular edema: Secondary | ICD-10-CM | POA: Diagnosis not present

## 2022-02-07 DIAGNOSIS — I69354 Hemiplegia and hemiparesis following cerebral infarction affecting left non-dominant side: Secondary | ICD-10-CM | POA: Diagnosis not present

## 2022-02-07 DIAGNOSIS — E11610 Type 2 diabetes mellitus with diabetic neuropathic arthropathy: Secondary | ICD-10-CM | POA: Diagnosis not present

## 2022-02-07 DIAGNOSIS — I25810 Atherosclerosis of coronary artery bypass graft(s) without angina pectoris: Secondary | ICD-10-CM | POA: Diagnosis not present

## 2022-02-07 DIAGNOSIS — Z7982 Long term (current) use of aspirin: Secondary | ICD-10-CM | POA: Diagnosis not present

## 2022-02-07 DIAGNOSIS — I69328 Other speech and language deficits following cerebral infarction: Secondary | ICD-10-CM | POA: Diagnosis not present

## 2022-02-07 DIAGNOSIS — Z6841 Body Mass Index (BMI) 40.0 and over, adult: Secondary | ICD-10-CM

## 2022-02-07 DIAGNOSIS — Z48812 Encounter for surgical aftercare following surgery on the circulatory system: Secondary | ICD-10-CM | POA: Diagnosis not present

## 2022-02-07 DIAGNOSIS — Z7984 Long term (current) use of oral hypoglycemic drugs: Secondary | ICD-10-CM | POA: Diagnosis not present

## 2022-02-07 NOTE — Progress Notes (Signed)
Medical Nutrition Therapy: Visit start time: 1400  end time: 1445 Assessment:   Referral Diagnosis: CHF, CAD, HTN Other medical history/ diagnoses: CVA 11/2021, Type 2 diabetes Psychosocial issues/ stress concerns: none  Medications, supplements: reconciled list in medical record   Current weight: 299-301 per patient (weighs at home daily)  Height: 5'5" BMI: 49.92  Progress and evaluation:  Patient reports making significant diet changes to improve health; she is following fluid restriction and '1500mg'$  sodium restriction; controlling carb intake, choosing foods low in saturated fats.   Food allergies: none known HbA1c on 12/04/21  was 7.4%; total cholesterol 139, HDL 42, LDL 79, triglycerides 91 on 12/03/21 (during hospitalization). Patient seeks help with following heart heatlhy eating habits to improve health and reduce health risk. She reports having just been discharged from home health care as well as PT.  She has difficulty walking any significant distance due to Charcot's foot     Dietary Intake:  Usual eating pattern includes 3 meals and 0-1 snacks per day. Dining out frequency: 0-1 meals per week.  Breakfast: whole grain cheerios with 2% milk; occasional egg (1) and 1 link [place] sausage  Snack: none Lunch: sandwich with low sodium sliced [place] or chicken on whole grain bread Snack: none Supper: roast chicken/ grilled salmon + broccoli/ salad + sometimes small baked potato Snack: none or a few grapes Beverages: 6oz water <8 cups daily, some with sugar free tea flavoring  Physical activity: PT exercise, some walking around the house several times a day   Intervention:   Nutrition Care Education:   Basic nutrition: appropriate nutrient balance; general nutrition guidelines    Advanced nutrition:  dining out and limiting sodium; food label reading Hypertension, CHF:  importance of controlling BP; identifying high sodium foods, goal for sodium intake; identifying food  sources of potassium, magnesium; options for seasoning foods; fluid sources; DASH diet overview and tips Hyperlipidemia: healthy and unhealthy fats; role of fiber  Other intervention notes: Patient has been making positive and appropriate diet and lifestyle changes; she is motivated to continue. No additional goals for change needed at this time; hopefully resources sent by email will help with consuming a variety of healthy options. No follow up scheduled; patient to schedule later if needed.   Nutritional Diagnosis:  [place]-2.1 Inpaired nutrition utilization and [place]-2.2 Altered nutrition-related laboratory As related to CHF, CAD, hyperlipidemia, type 2 diabetes.  As evidenced by blood pressure and cholesterol controlled with medications, elevated HbA1C. [place]-3.3 Overweight/obesity As related to history of excess calories and inadequate physical activity.  As evidenced by patient with current BMI of 49.92, making diet and lifestyle changes to promote weight loss and improve health.   Education Materials given:  DASH diet DASH diet sample menus; tips for Implementing DASH diet Visit summary with goals/ instructions to be viewed via patient portal   Learner/ who was taught:  Patient   Level of understanding: Verbalizes/ demonstrates competency  Demonstrated degree of understanding via:   Teach back Learning barriers: None   Willingness to learn/ readiness for change: Eager, change in progress   Monitoring and Evaluation:  Dietary intake, exercise, fluid status, BP control, blood lipids, BG control, and body weight      follow up: prn

## 2022-02-07 NOTE — Patient Instructions (Signed)
Continue with current heart healthy eating pattern, great job! Use DASH diet resources provided to help with consuming plenty of vegetables, fruits, and controlled portions of whole grains.

## 2022-02-21 DIAGNOSIS — R8290 Unspecified abnormal findings in urine: Secondary | ICD-10-CM | POA: Diagnosis not present

## 2022-02-26 DIAGNOSIS — G4733 Obstructive sleep apnea (adult) (pediatric): Secondary | ICD-10-CM | POA: Diagnosis not present

## 2022-02-26 DIAGNOSIS — R0683 Snoring: Secondary | ICD-10-CM | POA: Diagnosis not present

## 2022-02-26 DIAGNOSIS — Z8673 Personal history of transient ischemic attack (TIA), and cerebral infarction without residual deficits: Secondary | ICD-10-CM | POA: Diagnosis not present

## 2022-02-26 DIAGNOSIS — I10 Essential (primary) hypertension: Secondary | ICD-10-CM | POA: Diagnosis not present

## 2022-03-28 DIAGNOSIS — R0683 Snoring: Secondary | ICD-10-CM | POA: Diagnosis not present

## 2022-03-28 DIAGNOSIS — Z8673 Personal history of transient ischemic attack (TIA), and cerebral infarction without residual deficits: Secondary | ICD-10-CM | POA: Diagnosis not present

## 2022-03-28 DIAGNOSIS — I10 Essential (primary) hypertension: Secondary | ICD-10-CM | POA: Diagnosis not present

## 2022-03-28 DIAGNOSIS — G4733 Obstructive sleep apnea (adult) (pediatric): Secondary | ICD-10-CM | POA: Diagnosis not present

## 2022-06-19 DIAGNOSIS — R059 Cough, unspecified: Secondary | ICD-10-CM | POA: Diagnosis not present

## 2022-06-19 DIAGNOSIS — A499 Bacterial infection, unspecified: Secondary | ICD-10-CM | POA: Diagnosis not present

## 2022-06-25 DIAGNOSIS — H2589 Other age-related cataract: Secondary | ICD-10-CM | POA: Diagnosis not present

## 2022-06-25 DIAGNOSIS — R059 Cough, unspecified: Secondary | ICD-10-CM | POA: Diagnosis not present

## 2022-06-25 DIAGNOSIS — E113313 Type 2 diabetes mellitus with moderate nonproliferative diabetic retinopathy with macular edema, bilateral: Secondary | ICD-10-CM | POA: Diagnosis not present

## 2022-07-02 DIAGNOSIS — H2511 Age-related nuclear cataract, right eye: Secondary | ICD-10-CM | POA: Diagnosis not present

## 2022-07-06 DIAGNOSIS — I509 Heart failure, unspecified: Secondary | ICD-10-CM | POA: Diagnosis not present

## 2022-07-06 DIAGNOSIS — Z8709 Personal history of other diseases of the respiratory system: Secondary | ICD-10-CM | POA: Diagnosis not present

## 2022-07-06 DIAGNOSIS — E119 Type 2 diabetes mellitus without complications: Secondary | ICD-10-CM | POA: Diagnosis not present

## 2022-07-06 DIAGNOSIS — Z6841 Body Mass Index (BMI) 40.0 and over, adult: Secondary | ICD-10-CM | POA: Diagnosis not present

## 2022-07-06 DIAGNOSIS — Z794 Long term (current) use of insulin: Secondary | ICD-10-CM | POA: Diagnosis not present

## 2022-07-06 DIAGNOSIS — I69334 Monoplegia of upper limb following cerebral infarction affecting left non-dominant side: Secondary | ICD-10-CM | POA: Diagnosis not present

## 2022-07-06 DIAGNOSIS — Z7984 Long term (current) use of oral hypoglycemic drugs: Secondary | ICD-10-CM | POA: Diagnosis not present

## 2022-07-09 ENCOUNTER — Encounter: Payer: Self-pay | Admitting: Ophthalmology

## 2022-07-10 DIAGNOSIS — I10 Essential (primary) hypertension: Secondary | ICD-10-CM | POA: Diagnosis not present

## 2022-07-10 DIAGNOSIS — Z8673 Personal history of transient ischemic attack (TIA), and cerebral infarction without residual deficits: Secondary | ICD-10-CM | POA: Diagnosis not present

## 2022-07-10 DIAGNOSIS — G4733 Obstructive sleep apnea (adult) (pediatric): Secondary | ICD-10-CM | POA: Diagnosis not present

## 2022-07-10 DIAGNOSIS — R0683 Snoring: Secondary | ICD-10-CM | POA: Diagnosis not present

## 2022-07-16 NOTE — Discharge Instructions (Signed)

## 2022-07-18 ENCOUNTER — Other Ambulatory Visit: Payer: Self-pay

## 2022-07-18 ENCOUNTER — Encounter: Payer: Self-pay | Admitting: Ophthalmology

## 2022-07-18 ENCOUNTER — Ambulatory Visit
Admission: RE | Admit: 2022-07-18 | Discharge: 2022-07-18 | Disposition: A | Discharge status: Home / Self Care | Payer: HMO | Attending: Ophthalmology | Admitting: Ophthalmology

## 2022-07-18 ENCOUNTER — Encounter
Admission: RE | Disposition: A | Discharge status: Home / Self Care | Payer: Self-pay | Source: Home / Self Care | Attending: Ophthalmology

## 2022-07-18 ENCOUNTER — Ambulatory Visit: Payer: HMO | Admitting: Anesthesiology

## 2022-07-18 DIAGNOSIS — Z6841 Body Mass Index (BMI) 40.0 and over, adult: Secondary | ICD-10-CM | POA: Diagnosis not present

## 2022-07-18 DIAGNOSIS — Z87891 Personal history of nicotine dependence: Secondary | ICD-10-CM | POA: Diagnosis not present

## 2022-07-18 DIAGNOSIS — I509 Heart failure, unspecified: Secondary | ICD-10-CM | POA: Insufficient documentation

## 2022-07-18 DIAGNOSIS — E1140 Type 2 diabetes mellitus with diabetic neuropathy, unspecified: Secondary | ICD-10-CM | POA: Insufficient documentation

## 2022-07-18 DIAGNOSIS — E11319 Type 2 diabetes mellitus with unspecified diabetic retinopathy without macular edema: Secondary | ICD-10-CM | POA: Insufficient documentation

## 2022-07-18 DIAGNOSIS — Z955 Presence of coronary angioplasty implant and graft: Secondary | ICD-10-CM | POA: Diagnosis not present

## 2022-07-18 DIAGNOSIS — Z794 Long term (current) use of insulin: Secondary | ICD-10-CM | POA: Insufficient documentation

## 2022-07-18 DIAGNOSIS — E1136 Type 2 diabetes mellitus with diabetic cataract: Secondary | ICD-10-CM | POA: Diagnosis not present

## 2022-07-18 DIAGNOSIS — H2589 Other age-related cataract: Secondary | ICD-10-CM | POA: Diagnosis not present

## 2022-07-18 DIAGNOSIS — Z7984 Long term (current) use of oral hypoglycemic drugs: Secondary | ICD-10-CM | POA: Diagnosis not present

## 2022-07-18 DIAGNOSIS — G4730 Sleep apnea, unspecified: Secondary | ICD-10-CM | POA: Diagnosis not present

## 2022-07-18 DIAGNOSIS — I110 Hypertensive heart disease with heart failure: Secondary | ICD-10-CM | POA: Insufficient documentation

## 2022-07-18 DIAGNOSIS — I2510 Atherosclerotic heart disease of native coronary artery without angina pectoris: Secondary | ICD-10-CM | POA: Diagnosis not present

## 2022-07-18 HISTORY — PX: CATARACT EXTRACTION W/PHACO: SHX586

## 2022-07-18 HISTORY — DX: Cerebral infarction, unspecified: I63.9

## 2022-07-18 HISTORY — DX: Atherosclerotic heart disease of native coronary artery without angina pectoris: I25.10

## 2022-07-18 HISTORY — DX: Sleep apnea, unspecified: G47.30

## 2022-07-18 LAB — GLUCOSE, CAPILLARY: Glucose-Capillary: 244 mg/dL — ABNORMAL HIGH (ref 70–99)

## 2022-07-18 SURGERY — PHACOEMULSIFICATION, CATARACT, WITH IOL INSERTION
Anesthesia: Monitor Anesthesia Care | Site: Eye | Laterality: Right

## 2022-07-18 MED ORDER — SIGHTPATH DOSE#1 BSS IO SOLN
INTRAOCULAR | Status: DC | PRN
  Administered 2022-07-18: 15 mL via INTRAOCULAR

## 2022-07-18 MED ORDER — SIGHTPATH DOSE#1 SODIUM HYALURONATE 23 MG/ML IO SOLUTION: PREFILLED_SYRINGE | INTRAOCULAR | Status: DC | PRN

## 2022-07-18 MED ORDER — ARMC OPHTHALMIC DILATING DROPS
1.0000 | OPHTHALMIC | Status: DC | PRN
  Administered 2022-07-18 (×3): 1 via OPHTHALMIC

## 2022-07-18 MED ORDER — TETRACAINE HCL 0.5 % OP SOLN
1.0000 [drp] | OPHTHALMIC | Status: DC | PRN
  Administered 2022-07-18 (×3): 1 [drp] via OPHTHALMIC

## 2022-07-18 MED ORDER — FENTANYL CITRATE (PF) 100 MCG/2ML IJ SOLN
INTRAMUSCULAR | Status: DC | PRN
  Administered 2022-07-18 (×2): 50 ug via INTRAVENOUS

## 2022-07-18 MED ORDER — SIGHTPATH DOSE#1 BSS IO SOLN
INTRAOCULAR | Status: DC | PRN
  Administered 2022-07-18: 2 mL

## 2022-07-18 MED ORDER — BRIMONIDINE TARTRATE-TIMOLOL 0.2-0.5 % OP SOLN
OPHTHALMIC | Status: DC | PRN
  Administered 2022-07-18: 1 [drp] via OPHTHALMIC

## 2022-07-18 MED ORDER — MIDAZOLAM HCL 2 MG/2ML IJ SOLN
INTRAMUSCULAR | Status: DC | PRN
  Administered 2022-07-18: 2 mg via INTRAVENOUS

## 2022-07-18 MED ORDER — SODIUM HYALURONATE 23MG/ML IO SOSY
PREFILLED_SYRINGE | INTRAOCULAR | Status: DC | PRN
  Administered 2022-07-18: .6 mL via INTRAOCULAR

## 2022-07-18 MED ORDER — SIGHTPATH DOSE#1 BSS IO SOLN
INTRAOCULAR | Status: DC | PRN
  Administered 2022-07-18: 104 mL via OPHTHALMIC

## 2022-07-18 MED ORDER — SIGHTPATH DOSE#1 NA HYALUR & NA CHOND-NA HYALUR IO KIT
PACK | INTRAOCULAR | Status: DC | PRN
  Administered 2022-07-18: 1 via OPHTHALMIC

## 2022-07-18 MED ORDER — TRYPAN BLUE 0.06 % IO SOSY
PREFILLED_SYRINGE | INTRAOCULAR | Status: DC | PRN
  Administered 2022-07-18: .5 mL via INTRAOCULAR

## 2022-07-18 MED ORDER — CEFUROXIME OPHTHALMIC INJECTION 1 MG/0.1 ML
INJECTION | OPHTHALMIC | Status: DC | PRN
  Administered 2022-07-18: .1 mL via INTRACAMERAL

## 2022-07-18 SURGICAL SUPPLY — 12 items
CANNULA ANT/CHMB 27G (MISCELLANEOUS) IMPLANT
CANNULA ANT/CHMB 27GA (MISCELLANEOUS) ×1 IMPLANT
CATARACT SUITE SIGHTPATH (MISCELLANEOUS) ×1 IMPLANT
FEE CATARACT SUITE SIGHTPATH (MISCELLANEOUS) ×1 IMPLANT
GLOVE SRG 8 PF TXTR STRL LF DI (GLOVE) ×1 IMPLANT
GLOVE SURG ENC TEXT LTX SZ7.5 (GLOVE) ×1 IMPLANT
GLOVE SURG UNDER POLY LF SZ8 (GLOVE) ×1
LENS IOL TECNIS EYHANCE 21.0 (Intraocular Lens) IMPLANT
NDL FILTER BLUNT 18X1 1/2 (NEEDLE) ×1 IMPLANT
NEEDLE FILTER BLUNT 18X1 1/2 (NEEDLE) ×1 IMPLANT
SYR 3ML LL SCALE MARK (SYRINGE) ×1 IMPLANT
WATER STERILE IRR 250ML POUR (IV SOLUTION) ×1 IMPLANT

## 2022-07-18 NOTE — Anesthesia Postprocedure Evaluation (Signed)
Anesthesia Post Note  Patient: Julia Simmons  Procedure(s) Performed: CATARACT EXTRACTION PHACO AND INTRAOCULAR LENS PLACEMENT (IOC) RIGHT DIABETIC HEALON 5 VISION BLUE 33.79 2:31.3 (Right: Eye)  Patient location during evaluation: PACU Anesthesia Type: MAC Level of consciousness: awake and alert Pain management: pain level controlled Vital Signs Assessment: post-procedure vital signs reviewed and stable Respiratory status: spontaneous breathing, nonlabored ventilation, respiratory function stable and patient connected to nasal cannula oxygen Cardiovascular status: stable and blood pressure returned to baseline Postop Assessment: no apparent nausea or vomiting Anesthetic complications: no   No notable events documented.   Last Vitals:  Vitals:   07/18/22 0853 07/18/22 0859  BP: 135/70 137/61  Pulse: 65 66  Resp: 18 17  Temp: (!) 36.4 C (!) 36.4 C  SpO2: 97% 96%    Last Pain:  Vitals:   07/18/22 0859  TempSrc:   PainSc: 0-No pain                 Arita Miss

## 2022-07-18 NOTE — Op Note (Signed)
OPERATIVE NOTE  CHANTEE CERINO [id number] 07/18/2022   PREOPERATIVE DIAGNOSIS:  H25.89 Cataract            Mature (Total) Cataract Right Eye H25.89   POSTOPERATIVE DIAGNOSIS:same          PROCEDURE:  Phacoemusification with posterior chamber intraocular lens placement of the right eye .  Vision Blue dye was used to stain the lens capsule.  LENS:   Implant Name Type Inv. Item Serial No. Manufacturer Lot No. LRB No. Used Action  LENS IOL TECNIS EYHANCE 21.0 - [id number] Intraocular Lens LENS IOL TECNIS EYHANCE 21.0 [id number] [employer]  Right 1 Implanted       ULTRASOUND TIME:  2 minutes 31 seconds, CDE 33.8  SURGEON:  Wyonia Hough, MD   ANESTHESIA:  Topical with tetracaine drops augmented with 1% preservative-free intracameral lidocaine.   COMPLICATIONS:  None.   DESCRIPTION OF PROCEDURE: The patient was identified in the holding room and transported to the operating room and placed in the supine position under the operating microscope.  The right eye was identified as the operative eye and it was prepped and draped in the usual sterile ophthalmic fashion.  A 1 millimeter clear-corneal paracentesis was made at the 12:00 position.  0.5 ml of preservative-free 1% lidocaine was injected into the anterior chamber. The anterior chamber was filled with Healon 5 viscoelastic.    Vision Blue dye was then injected under the viscoelastic to stain the lens capsule.  BSS was then used to wash the dye out.  Additional Healon 5 was placed into the anterior chamber. A 2.4 millimeter keratome was used to make a near-clear corneal incision at the 9:00 position. A curvilinear capsulorrhexis was made with a cystotome and capsulorrhexis forceps.  Balanced salt solution was used to hydrodissect and hydrodelineate the nucleus.  Viscoat was then placed in the anterior chamber.   Phacoemulsification was then used in stop and chop fashion to remove the lens nucleus and epinucleus.  The remaining  cortex was then removed using the irrigation and aspiration handpiece. Provisc was then placed into the capsular bag to distend it for lens placement.  A 21.0 -diopter lens was then injected into the capsular bag.  The remaining viscoelastic was aspirated.   Wounds were hydrated with balanced salt solution.  The anterior chamber was inflated to a physiologic pressure with balanced salt solution. Cefuroxime 0.1 ml of a '10mg'$ /ml solution was injected into the anterior chamber for a dose of 1 mg of intracameral antibiotic at the completion of the case.  No wound leaks were noted.  Topical Timolol and Brimonidine drops were applied to the eye.  The patient was taken to the recovery room in stable condition without complications of anesthesia or surgery.  Lateia Fraser 07/18/2022, 8:53 AM

## 2022-07-18 NOTE — Anesthesia Preprocedure Evaluation (Signed)
Anesthesia Evaluation  Patient identified by MRN, date of birth, ID band Patient awake    Reviewed: Allergy & Precautions, NPO status , Patient's Chart, lab work & pertinent test results  History of Anesthesia Complications Negative for: history of anesthetic complications  Airway Mallampati: III  TM Distance: >3 FB Neck ROM: Full    Dental no notable dental hx. (+) Teeth Intact   Pulmonary sleep apnea and Continuous Positive Airway Pressure Ventilation , neg COPD, Patient abstained from smoking.Not current smoker   Pulmonary exam normal breath sounds clear to auscultation       Cardiovascular Exercise Tolerance: Poor METShypertension, + CAD, + Cardiac Stents and +CHF  (-) Past MI (-) dysrhythmias  Rhythm:Regular Rate:Normal - Systolic murmurs TTE 0277 IMPRESSIONS     1. Left ventricular ejection fraction, by estimation, is 60 to 65%. The  left ventricle has normal function. The left ventricle has no regional  wall motion abnormalities. Left ventricular diastolic parameters are  consistent with Grade II diastolic  dysfunction (pseudonormalization).   2. Right ventricular systolic function is normal. The right ventricular  size is normal.   3. Left atrial size was mildly dilated.     Neuro/Psych CVA, No Residual Symptoms  negative psych ROS   GI/Hepatic ,neg GERD  ,,(+)     (-) substance abuse    Endo/Other  diabetes, Type 2, Insulin Dependent  Morbid obesity  Renal/GU negative Renal ROS     Musculoskeletal   Abdominal  (+) + obese  Peds  Hematology   Anesthesia Other Findings Past Medical History: No date: Charcot's joint of foot No date: Coronary artery disease No date: Diabetes mellitus with neuropathy ([place]) No date: Diabetic retinopathy ([place]) No date: Hypertension No date: Morbid obesity with BMI of 50.0-59.9, adult (HCC) No date: Sleep apnea No date: Stroke [facility])  Reproductive/Obstetrics                              Anesthesia Physical Anesthesia Plan  ASA: 3  Anesthesia Plan: MAC   Post-op Pain Management: Minimal or no pain anticipated   Induction: Intravenous  PONV Risk Score and Plan: 2 and Midazolam  Airway Management Planned: Nasal Cannula  Additional Equipment:   Intra-op Plan:   Post-operative Plan:   Informed Consent: I have reviewed the patients History and Physical, chart, labs and discussed the procedure including the risks, benefits and alternatives for the proposed anesthesia with the patient or authorized representative who has indicated his/her understanding and acceptance.       Plan Discussed with: CRNA and Surgeon  Anesthesia Plan Comments: (Patient says she thinks she is able to lay flat. I explained we would be unable to do the procedure here (without general anesthesia) if she cannot lay flat, given her BMI. She also is extremely claustrophobic at the thought of the face drape; I explained this drape would be placed after administration of light sedation, and the vast majority of people tolerate it very well. She has had her other cataract done before and has tolerated it.  Explained risks of anesthesia, including PONV, and rare emergencies such as cardiac events, respiratory problems, and allergic reactions, requiring invasive intervention. Discussed the role of CRNA in patient's perioperative care. Patient understands. Patient informed about increased incidence of above perioperative risk due to high BMI. Patient understands.  )       Anesthesia Quick Evaluation

## 2022-07-18 NOTE — H&P (Signed)
[facility]   Primary Care Physician:  Marinda Elk, MD Ophthalmologist: Dr. Leandrew Koyanagi  Pre-Procedure History & Physical: HPI:  Julia Simmons is a 62 y.o. female here for ophthalmic surgery.   Past Medical History:  Diagnosis Date   Charcot's joint of foot    Coronary artery disease    Diabetes mellitus with neuropathy (HCC)    Diabetic retinopathy ([place])    Hypertension    Morbid obesity with BMI of 50.0-59.9, adult ([place])    Sleep apnea    Stroke [facility])     Past Surgical History:  Procedure Laterality Date   CESAREAN SECTION      Prior to Admission medications   Medication Sig Start Date End Date Taking? Authorizing Provider  albuterol (VENTOLIN HFA) 108 (90 Base) MCG/ACT inhaler Inhale into the lungs. 11/08/21  Yes [provider]  aspirin EC 81 MG tablet Take 81 mg by mouth daily.   Yes [provider]  atorvastatin (LIPITOR) 40 MG tablet Take by mouth. 12/15/21  Yes [provider]  Continuous Blood Gluc Sensor (FREESTYLE LIBRE SENSOR SYSTEM) MISC Use 1 kit every 14 (fourteen) days for glucose monitoring 12/25/21  Yes [provider]  furosemide (LASIX) 40 MG tablet Take 1.5 tablets (60 mg total) by mouth daily. 06/17/20  Yes Adhikari, Tamsen Meek, MD  guaiFENesin-dextromethorphan (ROBITUSSIN DM) 100-10 MG/5ML syrup Take 10 mLs by mouth every 6 (six) hours as needed for cough. 06/17/20  Yes Shelly Coss, MD  Insulin Aspart FlexPen (NOVOLOG) 100 UNIT/ML Inject 50 units before breakfast, lunch and supper 09/04/21  Yes [provider]  insulin glargine (LANTUS) 100 UNIT/ML injection Inject 100 Units into the skin at bedtime.   Yes [provider]  Insulin Glargine-Lixisenatide (SOLIQUA) 100-33 UNT-MCG/ML SOPN Inject into the skin. 11/10/21  Yes [provider]  metFORMIN (GLUCOPHAGE) 500 MG tablet Take 1,000 mg by mouth daily with breakfast.   Yes [provider]  metFORMIN (GLUCOPHAGE) 500  MG tablet Take 1,000 mg by mouth daily with supper.   Yes [provider]  metoprolol succinate (TOPROL-XL) 100 MG 24 hr tablet Take 100 mg by mouth daily. 05/09/20  Yes [provider]  oxybutynin (DITROPAN-XL) 10 MG 24 hr tablet Take 1 tablet by mouth daily. 11/03/21  Yes [provider]  sacubitril-valsartan (ENTRESTO) 24-26 MG Take 1 tablet by mouth every 12 (twelve) hours. 12/15/21  Yes [provider]  spironolactone (ALDACTONE) 25 MG tablet Take by mouth. 12/15/21  Yes [provider]  BD INSULIN SYRINGE U/F 31G X 5/16" 1 ML MISC USE AS DIRECTED ONCE DAILY WITH LEVEMIR (VIAL). Patient not taking: Reported on 07/09/2022 11/07/21   [provider]  polyethylene glycol (MIRALAX) 17 g packet Take 17 g by mouth daily as needed for moderate constipation. Patient not taking: Reported on 07/09/2022 06/17/20   Shelly Coss, MD  potassium chloride 20 MEQ TBCR Take 20 mEq by mouth daily. Patient not taking: Reported on 07/09/2022 06/17/20   Shelly Coss, MD  Vitamin D, Ergocalciferol, 2000 units CAPS Take 2,000 Units by mouth daily. Patient not taking: Reported on 07/18/2022    [provider]    Allergies as of 06/06/2022 - Review Complete 02/07/2022  Allergen Reaction Noted   Hydrochlorothiazide Swelling 01/11/2017   Sulfa antibiotics Swelling 01/11/2017    Family History  Problem Relation Age of Onset   Breast cancer Neg Hx     Social History   Socioeconomic History   Marital status: Married  Spouse name: Not on file   Number of children: Not on file   Years of education: Not on file   Highest education level: Not on file  Occupational History   Occupation: disability  Tobacco Use   Smoking status: Never   Smokeless tobacco: Never  Substance and Sexual Activity   Alcohol use: Not Currently   Drug use: Never   Sexual activity: Not on file  Other Topics Concern   Not on file  Social History Narrative   Not on  file   Social Determinants of Health   Financial Resource Strain: Not on file  Food Insecurity: Not on file  Transportation Needs: Not on file  Physical Activity: Not on file  Stress: Not on file  Social Connections: Not on file  Intimate Partner Violence: Not on file    Review of Systems: See HPI, otherwise negative ROS  Physical Exam: BP (!) 166/74   Temp (!) 97 F (36.1 C) (Tympanic)   Resp 13   Ht '5\' 5"'$  (1.651 m)   Wt (!) 139.1 kg   SpO2 98%   BMI 51.04 kg/m  General:   Alert,  pleasant and cooperative in NAD Head:  Normocephalic and atraumatic. Lungs:  Clear to auscultation.    Heart:  Regular rate and rhythm.   Impression/Plan: Julia Simmons is here for ophthalmic surgery.  Risks, benefits, limitations, and alternatives regarding ophthalmic surgery have been reviewed with the patient.  Questions have been answered.  All parties agreeable.   Leandrew Koyanagi, MD  07/18/2022, 8:24 AM

## 2022-07-18 NOTE — Transfer of Care (Signed)
Immediate Anesthesia Transfer of Care Note  Patient: Julia Simmons  Procedure(s) Performed: CATARACT EXTRACTION PHACO AND INTRAOCULAR LENS PLACEMENT (IOC) RIGHT DIABETIC HEALON 5 VISION BLUE 33.79 2:31.3 (Right: Eye)  Patient Location: PACU  Anesthesia Type: MAC  Level of Consciousness: awake, alert  and patient cooperative  Airway and Oxygen Therapy: Patient Spontanous Breathing and Patient connected to supplemental oxygen  Post-op Assessment: Post-op Vital signs reviewed, Patient's Cardiovascular Status Stable, Respiratory Function Stable, Patent Airway and No signs of Nausea or vomiting  Post-op Vital Signs: Reviewed and stable  Complications: No notable events documented.

## 2022-08-10 DIAGNOSIS — G4733 Obstructive sleep apnea (adult) (pediatric): Secondary | ICD-10-CM | POA: Diagnosis not present

## 2022-08-10 DIAGNOSIS — Z8673 Personal history of transient ischemic attack (TIA), and cerebral infarction without residual deficits: Secondary | ICD-10-CM | POA: Diagnosis not present

## 2022-08-10 DIAGNOSIS — R0683 Snoring: Secondary | ICD-10-CM | POA: Diagnosis not present

## 2022-08-10 DIAGNOSIS — I10 Essential (primary) hypertension: Secondary | ICD-10-CM | POA: Diagnosis not present

## 2022-08-13 DIAGNOSIS — Z961 Presence of intraocular lens: Secondary | ICD-10-CM | POA: Diagnosis not present

## 2022-08-13 DIAGNOSIS — E7800 Pure hypercholesterolemia, unspecified: Secondary | ICD-10-CM | POA: Diagnosis not present

## 2022-08-13 DIAGNOSIS — I10 Essential (primary) hypertension: Secondary | ICD-10-CM | POA: Diagnosis not present

## 2022-08-13 DIAGNOSIS — E1165 Type 2 diabetes mellitus with hyperglycemia: Secondary | ICD-10-CM | POA: Diagnosis not present

## 2022-08-20 ENCOUNTER — Other Ambulatory Visit: Payer: Self-pay | Admitting: Physician Assistant

## 2022-08-20 DIAGNOSIS — E1165 Type 2 diabetes mellitus with hyperglycemia: Secondary | ICD-10-CM | POA: Diagnosis not present

## 2022-08-20 DIAGNOSIS — I639 Cerebral infarction, unspecified: Secondary | ICD-10-CM | POA: Diagnosis not present

## 2022-08-20 DIAGNOSIS — N3946 Mixed incontinence: Secondary | ICD-10-CM | POA: Diagnosis not present

## 2022-08-20 DIAGNOSIS — I2510 Atherosclerotic heart disease of native coronary artery without angina pectoris: Secondary | ICD-10-CM | POA: Diagnosis not present

## 2022-08-20 DIAGNOSIS — I255 Ischemic cardiomyopathy: Secondary | ICD-10-CM | POA: Diagnosis not present

## 2022-08-20 DIAGNOSIS — R600 Localized edema: Secondary | ICD-10-CM | POA: Diagnosis not present

## 2022-08-20 DIAGNOSIS — G4733 Obstructive sleep apnea (adult) (pediatric): Secondary | ICD-10-CM | POA: Diagnosis not present

## 2022-08-20 DIAGNOSIS — I10 Essential (primary) hypertension: Secondary | ICD-10-CM | POA: Diagnosis not present

## 2022-08-20 DIAGNOSIS — Z1231 Encounter for screening mammogram for malignant neoplasm of breast: Secondary | ICD-10-CM | POA: Diagnosis not present

## 2022-08-20 DIAGNOSIS — I509 Heart failure, unspecified: Secondary | ICD-10-CM | POA: Diagnosis not present

## 2022-08-20 DIAGNOSIS — E7800 Pure hypercholesterolemia, unspecified: Secondary | ICD-10-CM | POA: Diagnosis not present

## 2022-08-24 ENCOUNTER — Other Ambulatory Visit: Payer: Self-pay

## 2022-08-24 ENCOUNTER — Encounter: Payer: Self-pay | Admitting: Intensive Care

## 2022-08-24 ENCOUNTER — Emergency Department
Admission: EM | Admit: 2022-08-24 | Discharge: 2022-08-24 | Disposition: A | Discharge status: Home / Self Care | Payer: HMO | Attending: Emergency Medicine | Admitting: Emergency Medicine

## 2022-08-24 DIAGNOSIS — N12 Tubulo-interstitial nephritis, not specified as acute or chronic: Secondary | ICD-10-CM | POA: Diagnosis not present

## 2022-08-24 DIAGNOSIS — D72829 Elevated white blood cell count, unspecified: Secondary | ICD-10-CM | POA: Diagnosis not present

## 2022-08-24 DIAGNOSIS — E1140 Type 2 diabetes mellitus with diabetic neuropathy, unspecified: Secondary | ICD-10-CM | POA: Insufficient documentation

## 2022-08-24 DIAGNOSIS — I10 Essential (primary) hypertension: Secondary | ICD-10-CM | POA: Insufficient documentation

## 2022-08-24 DIAGNOSIS — M549 Dorsalgia, unspecified: Secondary | ICD-10-CM | POA: Diagnosis present

## 2022-08-24 LAB — CBC WITH DIFFERENTIAL/PLATELET
Abs Immature Granulocytes: 0.02 10*3/uL (ref 0.00–0.07)
Basophils Absolute: 0.1 10*3/uL (ref 0.0–0.1)
Basophils Relative: 1 %
Eosinophils Absolute: 0.6 10*3/uL — ABNORMAL HIGH (ref 0.0–0.5)
Eosinophils Relative: 7 %
HCT: 42.7 % (ref 36.0–46.0)
Hemoglobin: 13.6 g/dL (ref 12.0–15.0)
Immature Granulocytes: 0 %
Lymphocytes Relative: 23 %
Lymphs Abs: 1.9 10*3/uL (ref 0.7–4.0)
MCH: 29.9 pg (ref 26.0–34.0)
MCHC: 31.9 g/dL (ref 30.0–36.0)
MCV: 93.8 fL (ref 80.0–100.0)
Monocytes Absolute: 0.7 10*3/uL (ref 0.1–1.0)
Monocytes Relative: 8 %
Neutro Abs: 5 10*3/uL (ref 1.7–7.7)
Neutrophils Relative %: 61 %
Platelets: 329 10*3/uL (ref 150–400)
RBC: 4.55 MIL/uL (ref 3.87–5.11)
RDW: 13.5 % (ref 11.5–15.5)
WBC: 8.3 10*3/uL (ref 4.0–10.5)
nRBC: 0 % (ref 0.0–0.2)

## 2022-08-24 LAB — URINALYSIS, ROUTINE W REFLEX MICROSCOPIC
Bilirubin Urine: NEGATIVE
Glucose, UA: >=500 mg/dL — AB
Hgb urine dipstick: NEGATIVE
Ketones, ur: NEGATIVE mg/dL
Nitrite: POSITIVE — AB
Protein, ur: NEGATIVE mg/dL
Specific Gravity, Urine: 1.007 (ref 1.005–1.030)
pH: 5 (ref 5.0–8.0)

## 2022-08-24 LAB — BASIC METABOLIC PANEL
Anion gap: 10 (ref 5–15)
BUN: 60 mg/dL — ABNORMAL HIGH (ref 6–20)
CO2: 23 mmol/L (ref 22–32)
Calcium: 9.1 mg/dL (ref 8.9–10.3)
Chloride: 105 mmol/L (ref 98–111)
Creatinine, Ser: 1.55 mg/dL — ABNORMAL HIGH (ref 0.44–1.00)
GFR, Estimated: 38 mL/min — ABNORMAL LOW (ref >60)
Glucose, Bld: 114 mg/dL — ABNORMAL HIGH (ref 70–99)
Potassium: 4 mmol/L (ref 3.5–5.1)
Sodium: 138 mmol/L (ref 135–145)

## 2022-08-24 MED ORDER — SODIUM CHLORIDE 0.9 % IV SOLN
1.0000 g | INTRAVENOUS | Status: DC
  Administered 2022-08-24: 1 g via INTRAVENOUS
  Filled 2022-08-24: qty 10

## 2022-08-24 MED ORDER — LIDOCAINE 5 % EX PTCH
1.0000 | MEDICATED_PATCH | CUTANEOUS | Status: DC
  Administered 2022-08-24: 1 via TRANSDERMAL
  Filled 2022-08-24: qty 1

## 2022-08-24 MED ORDER — MORPHINE SULFATE (PF) 4 MG/ML IV SOLN
5.0000 mg | Freq: Once | INTRAVENOUS | Status: DC
  Filled 2022-08-24: qty 2

## 2022-08-24 MED ORDER — MORPHINE SULFATE (PF) 4 MG/ML IV SOLN
4.0000 mg | Freq: Once | INTRAVENOUS | Status: AC
  Administered 2022-08-24: 4 mg via INTRAVENOUS

## 2022-08-24 MED ORDER — CEFDINIR 300 MG PO CAPS: 300.0000 mg | ORAL_CAPSULE | Freq: Two times a day (BID) | ORAL | 0 refills | Status: AC

## 2022-08-24 NOTE — ED Notes (Signed)
See triage note  Presents with lower back pain for the past couple of days   Denies any injury  States pain is non radiating

## 2022-08-24 NOTE — Discharge Instructions (Signed)
Please take antibiotics as prescribed.  Please return for any new, worsening, or change in symptoms or other concerns.

## 2022-08-24 NOTE — ED Provider Notes (Signed)
[facility] Provider Note    Event Date/Time   First MD Initiated Contact with Patient 08/24/22 [phone]     (approximate)   History   Back Pain   HPI  Julia Simmons is a 62 y.o. female with a past medical history of diabetes, morbid obesity, hypertension who presents today for evaluation of bilateral back pain for the past 2 days.  Patient denies any injury.  She denies any radiation of pain into her abdomen or into her legs.  She reports that this happened to her couple of years ago and she was diagnosed with a kidney infection.  Patient has not noticed any blood in her urine.  She denies history of kidney stone.  She denies fevers or chills.  No nausea or vomiting.  Patient Active Problem List   Diagnosis Date Noted   RLL pneumonia 06/13/2020   Diabetes mellitus with neuropathy ([place])    Hypertension    Morbid obesity with BMI of 50.0-59.9, adult [facility])           Physical Exam   Triage Vital Signs: ED Triage Vitals  Enc Vitals Group     BP 08/24/22 0915 136/76     Pulse Rate 08/24/22 0915 71     Resp 08/24/22 0915 18     Temp 08/24/22 0915 98 F (36.7 C)     Temp Source 08/24/22 0915 Oral     SpO2 08/24/22 0915 98 %     Weight 08/24/22 0918 (!) 304 lb (137.9 kg)     Height 08/24/22 0918 '5\' 5"'$  (1.651 m)     Head Circumference --      Peak Flow --      Pain Score 08/24/22 0918 7     Pain Loc --      Pain Edu? --      Excl. in [place]? --     Most recent vital signs: Vitals:   08/24/22 0915  BP: 136/76  Pulse: 71  Resp: 18  Temp: 98 F (36.7 C)  SpO2: 98%    Physical Exam Vitals and nursing note reviewed.  Constitutional:      General: Awake and alert. No acute distress.    Appearance: Normal appearance. The patient is obese.  HENT:     Head: Normocephalic and atraumatic.     Mouth: Mucous membranes are moist.  Eyes:     General: PERRL. Normal EOMs        Right eye: No discharge.        Left eye: No discharge.      Conjunctiva/sclera: Conjunctivae normal.  Cardiovascular:     Rate and Rhythm: Normal rate and regular rhythm.     Pulses: Normal pulses.  Pulmonary:     Effort: Pulmonary effort is normal. No respiratory distress.     Breath sounds: Normal breath sounds.  Abdominal:     Abdomen is soft. There is no abdominal tenderness. No rebound or guarding. No distention.  Bilateral CVA tenderness with tenderness throughout her lumbar area without midline vertebral tenderness.  Normal strength and sensation of bilateral lower extremities Musculoskeletal:        General: No swelling. Normal range of motion.     Cervical back: Normal range of motion and neck supple.  Skin:    General: Skin is warm and dry.     Capillary Refill: Capillary refill takes less than 2 seconds.     Findings: No rash.  Neurological:  Mental Status: The patient is awake and alert.      ED Results / Procedures / Treatments   Labs (all labs ordered are listed, but only abnormal results are displayed) Labs Reviewed  URINALYSIS, ROUTINE W REFLEX MICROSCOPIC - Abnormal; Notable for the following components:      Result Value   Color, Urine STRAW (*)    APPearance CLEAR (*)    Glucose, UA >=500 (*)    Nitrite POSITIVE (*)    Leukocytes,Ua SMALL (*)    Bacteria, UA MANY (*)    All other components within normal limits  BASIC METABOLIC PANEL - Abnormal; Notable for the following components:   Glucose, Bld 114 (*)    BUN 60 (*)    Creatinine, Ser 1.55 (*)    GFR, Estimated 38 (*)    All other components within normal limits  CBC WITH DIFFERENTIAL/PLATELET - Abnormal; Notable for the following components:   Eosinophils Absolute 0.6 (*)    All other components within normal limits     EKG     RADIOLOGY     PROCEDURES:  Critical Care performed:   Procedures   MEDICATIONS ORDERED IN ED: Medications  lidocaine (LIDODERM) 5 % 1 patch (1 patch Transdermal Patch Applied 08/24/22 0945)  cefTRIAXone  (ROCEPHIN) 1 g in sodium chloride 0.9 % 100 mL IVPB (0 g Intravenous Stopped 08/24/22 1118)  morphine (PF) 4 MG/ML injection 4 mg (4 mg Intravenous Given 08/24/22 1036)     IMPRESSION / MDM / ASSESSMENT AND PLAN / ED COURSE  I reviewed the triage vital signs and the nursing notes.   Differential diagnosis includes, but is not limited to, UTI, pyelonephritis, nephrolithiasis, back spasm, strain.  Patient is awake and alert, hemodynamically stable and afebrile.  She is easily reproducible pain across her low back.  No abdominal pain or tenderness.  No unilateral symptoms to suggest nephrolithiasis, no history of kidney stones.  She has normal strength and sensation in her bilateral lower extremities, no urinary fecal incontinence or retention, no evidence of cord compression.  She has normal gait.  Labs obtained are overall reassuring.  Urinalysis reveals leukocytes and nitrites.  She was treated for pyelonephritis with her gram of Rocephin, and discharged on 10 days of cephalosporin.  We discussed return precautions the importance of close outpatient follow-up.  Patient understands and agrees with plan.  She was discharged in stable condition.   Patient's presentation is most consistent with acute complicated illness / injury requiring diagnostic workup.     FINAL CLINICAL IMPRESSION(S) / ED DIAGNOSES   Final diagnoses:  Pyelonephritis     Rx / DC Orders   ED Discharge Orders          Ordered    cefdinir (OMNICEF) 300 MG capsule  2 times daily        08/24/22 1103             Note:  This document was prepared using Dragon voice recognition software and may include unintentional dictation errors.   Marquette Old, PA-C 08/24/22 1400    Nathaniel Man, MD 08/24/22 [phone]

## 2022-08-24 NOTE — ED Triage Notes (Signed)
Patient c/o lower back pain for a few days.

## 2022-08-29 DIAGNOSIS — N1831 Chronic kidney disease, stage 3a: Secondary | ICD-10-CM | POA: Diagnosis not present

## 2022-08-29 DIAGNOSIS — N10 Acute tubulo-interstitial nephritis: Secondary | ICD-10-CM | POA: Diagnosis not present

## 2022-08-29 DIAGNOSIS — E1122 Type 2 diabetes mellitus with diabetic chronic kidney disease: Secondary | ICD-10-CM | POA: Diagnosis not present

## 2022-08-29 DIAGNOSIS — Z794 Long term (current) use of insulin: Secondary | ICD-10-CM | POA: Diagnosis not present

## 2022-08-29 DIAGNOSIS — M545 Low back pain, unspecified: Secondary | ICD-10-CM | POA: Diagnosis not present

## 2022-08-30 ENCOUNTER — Telehealth: Payer: Self-pay | Admitting: *Deleted

## 2022-08-30 NOTE — Telephone Encounter (Signed)
     Patient  visit on 08/24/2022  at [facility] was for treatment  Have you been able to follow up with your primary care physician?feeling better will not see PCP   The patient was  able to obtain any needed medicine or equipment.  Are there diet recommendations that you are having difficulty following?  Patient expresses understanding of discharge instructions and education provided has no other needs at this time.   [place] [phone] [address] , [place] [address] Email : [email or web address]

## 2022-09-03 DIAGNOSIS — E1159 Type 2 diabetes mellitus with other circulatory complications: Secondary | ICD-10-CM | POA: Diagnosis not present

## 2022-09-03 DIAGNOSIS — E785 Hyperlipidemia, unspecified: Secondary | ICD-10-CM | POA: Diagnosis not present

## 2022-09-03 DIAGNOSIS — E1129 Type 2 diabetes mellitus with other diabetic kidney complication: Secondary | ICD-10-CM | POA: Diagnosis not present

## 2022-09-03 DIAGNOSIS — Z794 Long term (current) use of insulin: Secondary | ICD-10-CM | POA: Diagnosis not present

## 2022-09-03 DIAGNOSIS — G4733 Obstructive sleep apnea (adult) (pediatric): Secondary | ICD-10-CM | POA: Diagnosis not present

## 2022-09-03 DIAGNOSIS — E1165 Type 2 diabetes mellitus with hyperglycemia: Secondary | ICD-10-CM | POA: Diagnosis not present

## 2022-09-03 DIAGNOSIS — E11610 Type 2 diabetes mellitus with diabetic neuropathic arthropathy: Secondary | ICD-10-CM | POA: Diagnosis not present

## 2022-09-03 DIAGNOSIS — E1142 Type 2 diabetes mellitus with diabetic polyneuropathy: Secondary | ICD-10-CM | POA: Diagnosis not present

## 2022-09-03 DIAGNOSIS — E1169 Type 2 diabetes mellitus with other specified complication: Secondary | ICD-10-CM | POA: Diagnosis not present

## 2022-09-03 DIAGNOSIS — E113293 Type 2 diabetes mellitus with mild nonproliferative diabetic retinopathy without macular edema, bilateral: Secondary | ICD-10-CM | POA: Diagnosis not present

## 2022-09-03 DIAGNOSIS — E1122 Type 2 diabetes mellitus with diabetic chronic kidney disease: Secondary | ICD-10-CM | POA: Diagnosis not present

## 2022-09-03 DIAGNOSIS — Z8673 Personal history of transient ischemic attack (TIA), and cerebral infarction without residual deficits: Secondary | ICD-10-CM | POA: Diagnosis not present

## 2022-09-03 DIAGNOSIS — I10 Essential (primary) hypertension: Secondary | ICD-10-CM | POA: Diagnosis not present

## 2022-09-03 DIAGNOSIS — E669 Obesity, unspecified: Secondary | ICD-10-CM | POA: Diagnosis not present

## 2022-09-08 DIAGNOSIS — I10 Essential (primary) hypertension: Secondary | ICD-10-CM | POA: Diagnosis not present

## 2022-09-08 DIAGNOSIS — R0683 Snoring: Secondary | ICD-10-CM | POA: Diagnosis not present

## 2022-09-08 DIAGNOSIS — G4733 Obstructive sleep apnea (adult) (pediatric): Secondary | ICD-10-CM | POA: Diagnosis not present

## 2022-09-08 DIAGNOSIS — Z8673 Personal history of transient ischemic attack (TIA), and cerebral infarction without residual deficits: Secondary | ICD-10-CM | POA: Diagnosis not present

## 2022-09-10 DIAGNOSIS — E113313 Type 2 diabetes mellitus with moderate nonproliferative diabetic retinopathy with macular edema, bilateral: Secondary | ICD-10-CM | POA: Diagnosis not present

## 2022-09-10 DIAGNOSIS — Z961 Presence of intraocular lens: Secondary | ICD-10-CM | POA: Diagnosis not present

## 2022-09-24 DIAGNOSIS — Z955 Presence of coronary angioplasty implant and graft: Secondary | ICD-10-CM | POA: Diagnosis not present

## 2022-09-24 DIAGNOSIS — Z6841 Body Mass Index (BMI) 40.0 and over, adult: Secondary | ICD-10-CM | POA: Diagnosis not present

## 2022-09-24 DIAGNOSIS — E119 Type 2 diabetes mellitus without complications: Secondary | ICD-10-CM | POA: Diagnosis not present

## 2022-09-24 DIAGNOSIS — E782 Mixed hyperlipidemia: Secondary | ICD-10-CM | POA: Diagnosis not present

## 2022-09-24 DIAGNOSIS — I255 Ischemic cardiomyopathy: Secondary | ICD-10-CM | POA: Diagnosis not present

## 2022-09-24 DIAGNOSIS — I10 Essential (primary) hypertension: Secondary | ICD-10-CM | POA: Diagnosis not present

## 2022-09-24 DIAGNOSIS — I63511 Cerebral infarction due to unspecified occlusion or stenosis of right middle cerebral artery: Secondary | ICD-10-CM | POA: Diagnosis not present

## 2022-09-24 DIAGNOSIS — I5021 Acute systolic (congestive) heart failure: Secondary | ICD-10-CM | POA: Diagnosis not present

## 2022-09-24 DIAGNOSIS — I2510 Atherosclerotic heart disease of native coronary artery without angina pectoris: Secondary | ICD-10-CM | POA: Diagnosis not present

## 2022-09-24 DIAGNOSIS — Z794 Long term (current) use of insulin: Secondary | ICD-10-CM | POA: Diagnosis not present

## 2022-10-01 ENCOUNTER — Ambulatory Visit
Admission: RE | Admit: 2022-10-01 | Discharge: 2022-10-01 | Disposition: A | Discharge status: Home / Self Care | Payer: HMO | Source: Ambulatory Visit | Attending: Physician Assistant | Admitting: Physician Assistant

## 2022-10-01 DIAGNOSIS — Z1231 Encounter for screening mammogram for malignant neoplasm of breast: Secondary | ICD-10-CM | POA: Diagnosis not present

## 2022-10-09 DIAGNOSIS — R0683 Snoring: Secondary | ICD-10-CM | POA: Diagnosis not present

## 2022-10-09 DIAGNOSIS — Z8673 Personal history of transient ischemic attack (TIA), and cerebral infarction without residual deficits: Secondary | ICD-10-CM | POA: Diagnosis not present

## 2022-10-09 DIAGNOSIS — I10 Essential (primary) hypertension: Secondary | ICD-10-CM | POA: Diagnosis not present

## 2022-10-09 DIAGNOSIS — G4733 Obstructive sleep apnea (adult) (pediatric): Secondary | ICD-10-CM | POA: Diagnosis not present

## 2022-11-08 DIAGNOSIS — R0683 Snoring: Secondary | ICD-10-CM | POA: Diagnosis not present

## 2022-11-08 DIAGNOSIS — Z8673 Personal history of transient ischemic attack (TIA), and cerebral infarction without residual deficits: Secondary | ICD-10-CM | POA: Diagnosis not present

## 2022-11-08 DIAGNOSIS — G4733 Obstructive sleep apnea (adult) (pediatric): Secondary | ICD-10-CM | POA: Diagnosis not present

## 2022-11-08 DIAGNOSIS — I10 Essential (primary) hypertension: Secondary | ICD-10-CM | POA: Diagnosis not present

## 2022-12-05 DIAGNOSIS — G4733 Obstructive sleep apnea (adult) (pediatric): Secondary | ICD-10-CM | POA: Diagnosis not present

## 2022-12-05 DIAGNOSIS — I10 Essential (primary) hypertension: Secondary | ICD-10-CM | POA: Diagnosis not present

## 2022-12-05 DIAGNOSIS — Z8673 Personal history of transient ischemic attack (TIA), and cerebral infarction without residual deficits: Secondary | ICD-10-CM | POA: Diagnosis not present

## 2022-12-09 DIAGNOSIS — R0683 Snoring: Secondary | ICD-10-CM | POA: Diagnosis not present

## 2022-12-09 DIAGNOSIS — I10 Essential (primary) hypertension: Secondary | ICD-10-CM | POA: Diagnosis not present

## 2022-12-09 DIAGNOSIS — G4733 Obstructive sleep apnea (adult) (pediatric): Secondary | ICD-10-CM | POA: Diagnosis not present

## 2022-12-09 DIAGNOSIS — Z8673 Personal history of transient ischemic attack (TIA), and cerebral infarction without residual deficits: Secondary | ICD-10-CM | POA: Diagnosis not present

## 2022-12-24 DIAGNOSIS — E1122 Type 2 diabetes mellitus with diabetic chronic kidney disease: Secondary | ICD-10-CM | POA: Diagnosis not present

## 2022-12-24 DIAGNOSIS — Z794 Long term (current) use of insulin: Secondary | ICD-10-CM | POA: Diagnosis not present

## 2022-12-24 DIAGNOSIS — E1169 Type 2 diabetes mellitus with other specified complication: Secondary | ICD-10-CM | POA: Diagnosis not present

## 2022-12-24 DIAGNOSIS — E1165 Type 2 diabetes mellitus with hyperglycemia: Secondary | ICD-10-CM | POA: Diagnosis not present

## 2022-12-24 DIAGNOSIS — E113293 Type 2 diabetes mellitus with mild nonproliferative diabetic retinopathy without macular edema, bilateral: Secondary | ICD-10-CM | POA: Diagnosis not present

## 2022-12-24 DIAGNOSIS — E1159 Type 2 diabetes mellitus with other circulatory complications: Secondary | ICD-10-CM | POA: Diagnosis not present

## 2022-12-24 DIAGNOSIS — E11621 Type 2 diabetes mellitus with foot ulcer: Secondary | ICD-10-CM | POA: Diagnosis not present

## 2022-12-24 DIAGNOSIS — E785 Hyperlipidemia, unspecified: Secondary | ICD-10-CM | POA: Diagnosis not present

## 2022-12-24 DIAGNOSIS — E1129 Type 2 diabetes mellitus with other diabetic kidney complication: Secondary | ICD-10-CM | POA: Diagnosis not present

## 2022-12-24 DIAGNOSIS — N1832 Chronic kidney disease, stage 3b: Secondary | ICD-10-CM | POA: Diagnosis not present

## 2022-12-24 DIAGNOSIS — E11610 Type 2 diabetes mellitus with diabetic neuropathic arthropathy: Secondary | ICD-10-CM | POA: Diagnosis not present

## 2022-12-24 DIAGNOSIS — E1142 Type 2 diabetes mellitus with diabetic polyneuropathy: Secondary | ICD-10-CM | POA: Diagnosis not present

## 2023-01-08 DIAGNOSIS — Z8673 Personal history of transient ischemic attack (TIA), and cerebral infarction without residual deficits: Secondary | ICD-10-CM | POA: Diagnosis not present

## 2023-01-08 DIAGNOSIS — G4733 Obstructive sleep apnea (adult) (pediatric): Secondary | ICD-10-CM | POA: Diagnosis not present

## 2023-01-08 DIAGNOSIS — R0683 Snoring: Secondary | ICD-10-CM | POA: Diagnosis not present

## 2023-01-08 DIAGNOSIS — I10 Essential (primary) hypertension: Secondary | ICD-10-CM | POA: Diagnosis not present

## 2023-01-14 DIAGNOSIS — Z794 Long term (current) use of insulin: Secondary | ICD-10-CM | POA: Diagnosis not present

## 2023-01-14 DIAGNOSIS — E1140 Type 2 diabetes mellitus with diabetic neuropathy, unspecified: Secondary | ICD-10-CM | POA: Diagnosis not present

## 2023-01-14 DIAGNOSIS — B351 Tinea unguium: Secondary | ICD-10-CM | POA: Diagnosis not present

## 2023-01-14 DIAGNOSIS — L600 Ingrowing nail: Secondary | ICD-10-CM | POA: Diagnosis not present

## 2023-01-14 DIAGNOSIS — M14671 Charcot's joint, right ankle and foot: Secondary | ICD-10-CM | POA: Diagnosis not present

## 2023-01-14 DIAGNOSIS — L97511 Non-pressure chronic ulcer of other part of right foot limited to breakdown of skin: Secondary | ICD-10-CM | POA: Diagnosis not present

## 2023-01-15 DIAGNOSIS — L97511 Non-pressure chronic ulcer of other part of right foot limited to breakdown of skin: Secondary | ICD-10-CM | POA: Diagnosis not present

## 2023-01-15 DIAGNOSIS — Z7984 Long term (current) use of oral hypoglycemic drugs: Secondary | ICD-10-CM | POA: Diagnosis not present

## 2023-01-15 DIAGNOSIS — E11319 Type 2 diabetes mellitus with unspecified diabetic retinopathy without macular edema: Secondary | ICD-10-CM | POA: Diagnosis not present

## 2023-01-15 DIAGNOSIS — I509 Heart failure, unspecified: Secondary | ICD-10-CM | POA: Diagnosis not present

## 2023-01-15 DIAGNOSIS — Z794 Long term (current) use of insulin: Secondary | ICD-10-CM | POA: Diagnosis not present

## 2023-01-15 DIAGNOSIS — E11621 Type 2 diabetes mellitus with foot ulcer: Secondary | ICD-10-CM | POA: Diagnosis not present

## 2023-01-15 DIAGNOSIS — Z6841 Body Mass Index (BMI) 40.0 and over, adult: Secondary | ICD-10-CM | POA: Diagnosis not present

## 2023-02-04 DIAGNOSIS — R300 Dysuria: Secondary | ICD-10-CM | POA: Diagnosis not present

## 2023-02-04 DIAGNOSIS — N390 Urinary tract infection, site not specified: Secondary | ICD-10-CM | POA: Diagnosis not present

## 2023-02-08 DIAGNOSIS — Z8673 Personal history of transient ischemic attack (TIA), and cerebral infarction without residual deficits: Secondary | ICD-10-CM | POA: Diagnosis not present

## 2023-02-08 DIAGNOSIS — G4733 Obstructive sleep apnea (adult) (pediatric): Secondary | ICD-10-CM | POA: Diagnosis not present

## 2023-02-08 DIAGNOSIS — R0683 Snoring: Secondary | ICD-10-CM | POA: Diagnosis not present

## 2023-02-08 DIAGNOSIS — I10 Essential (primary) hypertension: Secondary | ICD-10-CM | POA: Diagnosis not present

## 2023-02-11 DIAGNOSIS — I10 Essential (primary) hypertension: Secondary | ICD-10-CM | POA: Diagnosis not present

## 2023-02-11 DIAGNOSIS — E782 Mixed hyperlipidemia: Secondary | ICD-10-CM | POA: Diagnosis not present

## 2023-02-11 DIAGNOSIS — N3946 Mixed incontinence: Secondary | ICD-10-CM | POA: Diagnosis not present

## 2023-02-11 DIAGNOSIS — I63511 Cerebral infarction due to unspecified occlusion or stenosis of right middle cerebral artery: Secondary | ICD-10-CM | POA: Diagnosis not present

## 2023-02-11 DIAGNOSIS — E1140 Type 2 diabetes mellitus with diabetic neuropathy, unspecified: Secondary | ICD-10-CM | POA: Diagnosis not present

## 2023-02-11 DIAGNOSIS — I255 Ischemic cardiomyopathy: Secondary | ICD-10-CM | POA: Diagnosis not present

## 2023-02-11 DIAGNOSIS — E119 Type 2 diabetes mellitus without complications: Secondary | ICD-10-CM | POA: Diagnosis not present

## 2023-02-11 DIAGNOSIS — M14671 Charcot's joint, right ankle and foot: Secondary | ICD-10-CM | POA: Diagnosis not present

## 2023-02-11 DIAGNOSIS — Z794 Long term (current) use of insulin: Secondary | ICD-10-CM | POA: Diagnosis not present

## 2023-02-11 DIAGNOSIS — L97511 Non-pressure chronic ulcer of other part of right foot limited to breakdown of skin: Secondary | ICD-10-CM | POA: Diagnosis not present

## 2023-02-11 DIAGNOSIS — I2510 Atherosclerotic heart disease of native coronary artery without angina pectoris: Secondary | ICD-10-CM | POA: Diagnosis not present

## 2023-02-11 DIAGNOSIS — Z955 Presence of coronary angioplasty implant and graft: Secondary | ICD-10-CM | POA: Diagnosis not present

## 2023-02-11 DIAGNOSIS — J449 Chronic obstructive pulmonary disease, unspecified: Secondary | ICD-10-CM | POA: Diagnosis not present

## 2023-02-15 DIAGNOSIS — N390 Urinary tract infection, site not specified: Secondary | ICD-10-CM | POA: Diagnosis not present

## 2023-02-19 DIAGNOSIS — E1122 Type 2 diabetes mellitus with diabetic chronic kidney disease: Secondary | ICD-10-CM | POA: Diagnosis not present

## 2023-02-19 DIAGNOSIS — I10 Essential (primary) hypertension: Secondary | ICD-10-CM | POA: Diagnosis not present

## 2023-02-19 DIAGNOSIS — Z0000 Encounter for general adult medical examination without abnormal findings: Secondary | ICD-10-CM | POA: Diagnosis not present

## 2023-02-19 DIAGNOSIS — N1831 Chronic kidney disease, stage 3a: Secondary | ICD-10-CM | POA: Diagnosis not present

## 2023-02-19 DIAGNOSIS — Z794 Long term (current) use of insulin: Secondary | ICD-10-CM | POA: Diagnosis not present

## 2023-02-19 DIAGNOSIS — E7800 Pure hypercholesterolemia, unspecified: Secondary | ICD-10-CM | POA: Diagnosis not present

## 2023-02-25 DIAGNOSIS — I10 Essential (primary) hypertension: Secondary | ICD-10-CM | POA: Diagnosis not present

## 2023-02-25 DIAGNOSIS — R8290 Unspecified abnormal findings in urine: Secondary | ICD-10-CM | POA: Diagnosis not present

## 2023-02-25 DIAGNOSIS — E7800 Pure hypercholesterolemia, unspecified: Secondary | ICD-10-CM | POA: Diagnosis not present

## 2023-02-25 DIAGNOSIS — Z1211 Encounter for screening for malignant neoplasm of colon: Secondary | ICD-10-CM | POA: Diagnosis not present

## 2023-02-25 DIAGNOSIS — I255 Ischemic cardiomyopathy: Secondary | ICD-10-CM | POA: Diagnosis not present

## 2023-02-25 DIAGNOSIS — N1831 Chronic kidney disease, stage 3a: Secondary | ICD-10-CM | POA: Diagnosis not present

## 2023-02-25 DIAGNOSIS — E1122 Type 2 diabetes mellitus with diabetic chronic kidney disease: Secondary | ICD-10-CM | POA: Diagnosis not present

## 2023-02-25 DIAGNOSIS — N3946 Mixed incontinence: Secondary | ICD-10-CM | POA: Diagnosis not present

## 2023-02-25 DIAGNOSIS — I2510 Atherosclerotic heart disease of native coronary artery without angina pectoris: Secondary | ICD-10-CM | POA: Diagnosis not present

## 2023-02-25 DIAGNOSIS — Z23 Encounter for immunization: Secondary | ICD-10-CM | POA: Diagnosis not present

## 2023-02-25 DIAGNOSIS — Z0000 Encounter for general adult medical examination without abnormal findings: Secondary | ICD-10-CM | POA: Diagnosis not present

## 2023-02-26 IMAGING — MG MM DIGITAL SCREENING BILAT W/ TOMO AND CAD
8 of 14 series · 8 of 40 positions shown · non-contrast
Comparison: None.

CLINICAL DATA: Screening.

EXAM:
DIGITAL SCREENING BILATERAL MAMMOGRAM WITH TOMOSYNTHESIS AND CAD
TECHNIQUE: Bilateral screening digital craniocaudal and mediolateral oblique
mammograms were obtained. Bilateral screening digital breast
tomosynthesis was performed. The images were evaluated with
computer-aided detection.

[R CC synth-2D (1 of 2)]
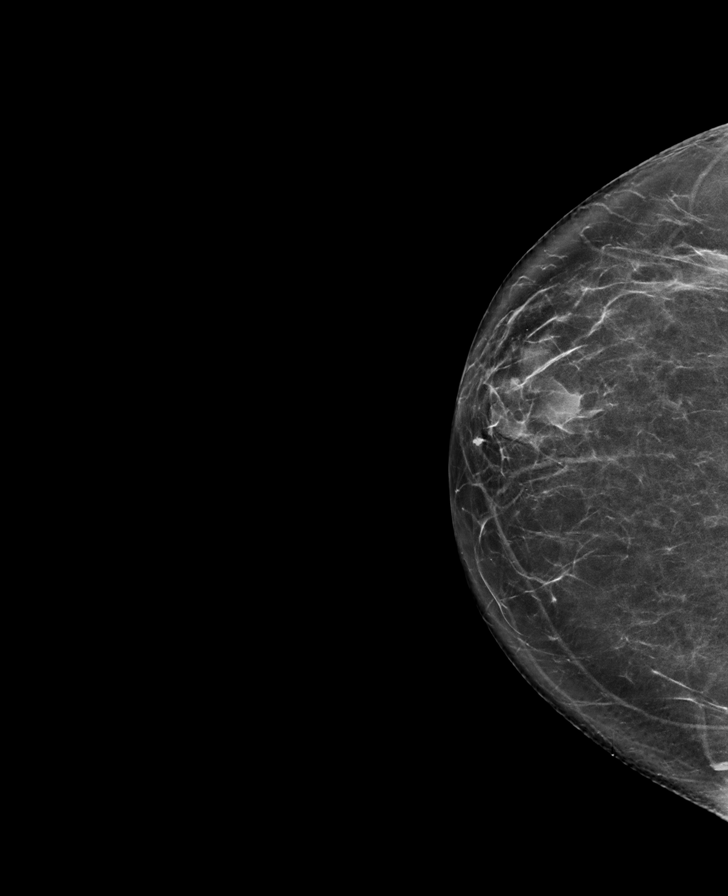

[R CC synth-2D (2 of 2)]
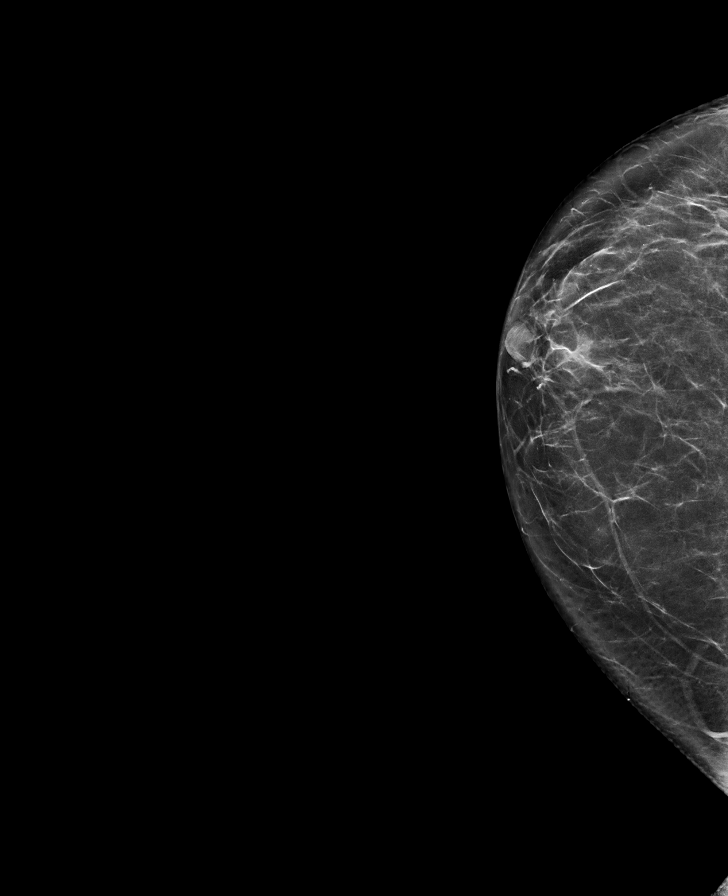

[R MLO synth-2D (1 of 2)]
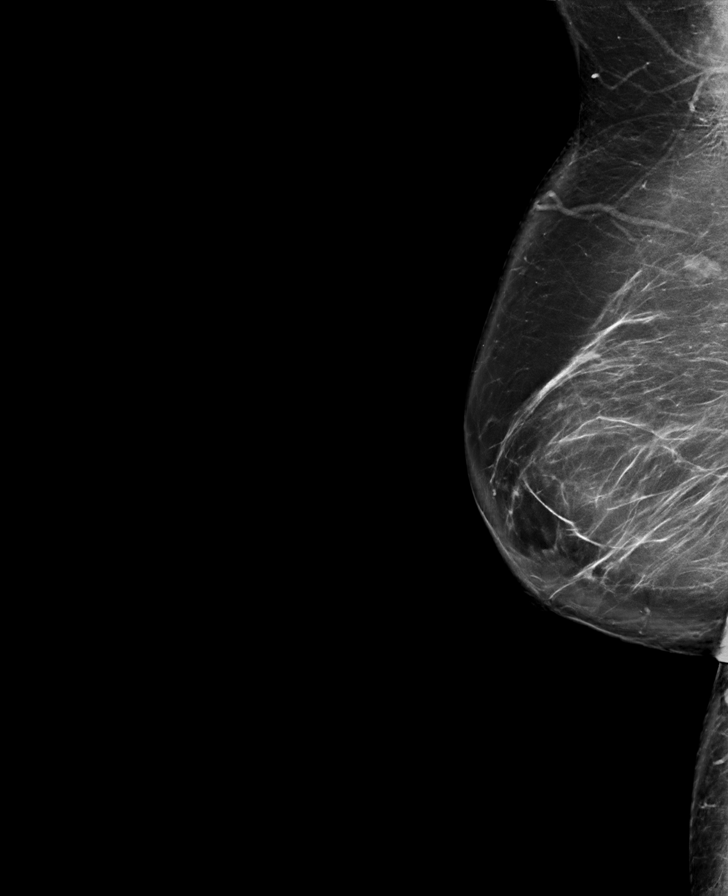

[L CC synth-2D (1 of 2)]
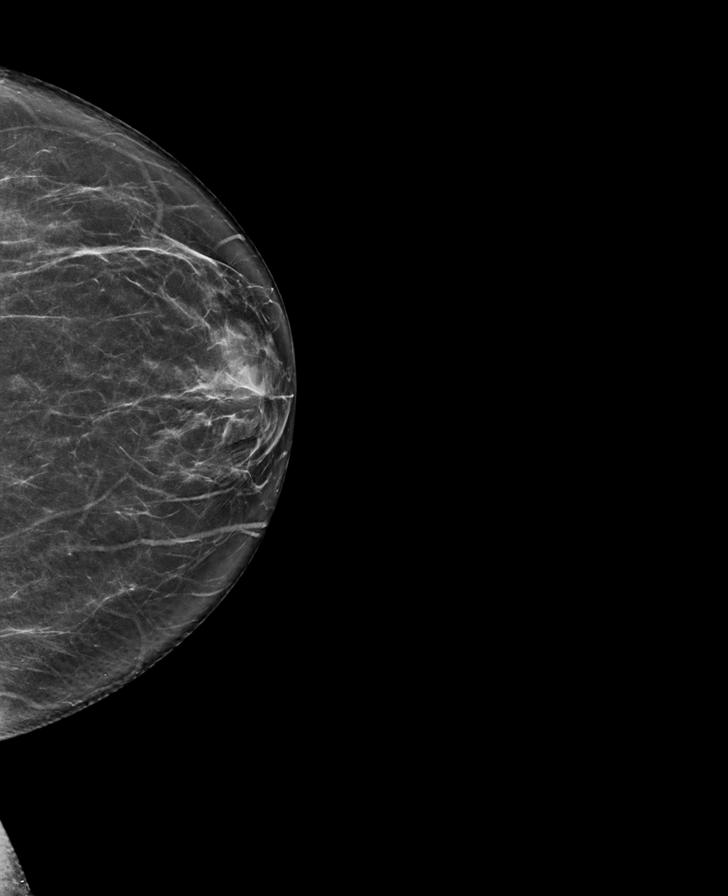

[L MLO synth-2D]
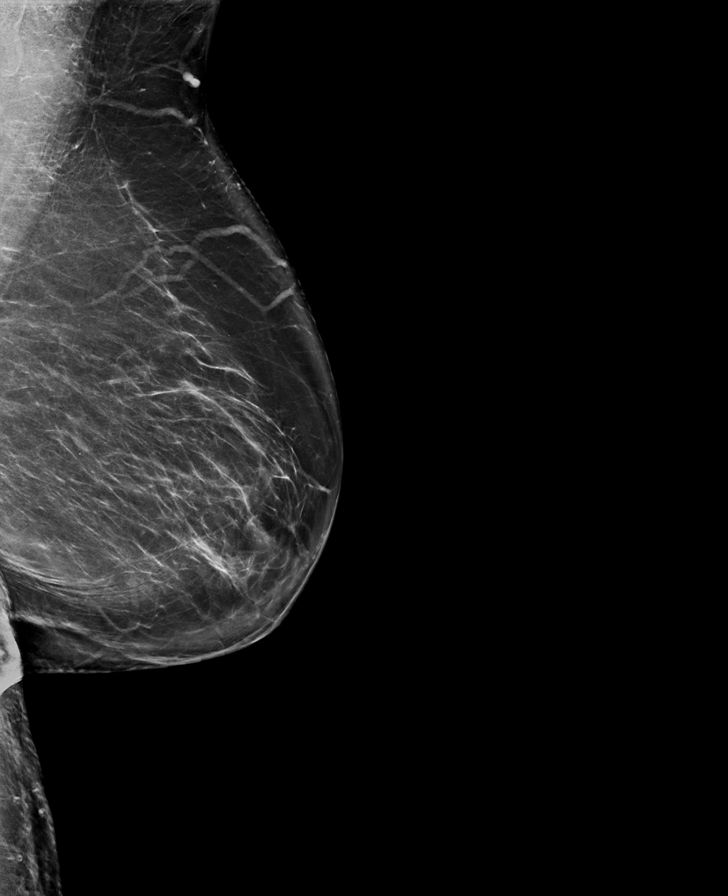

[L CC synth-2D (2 of 2)]
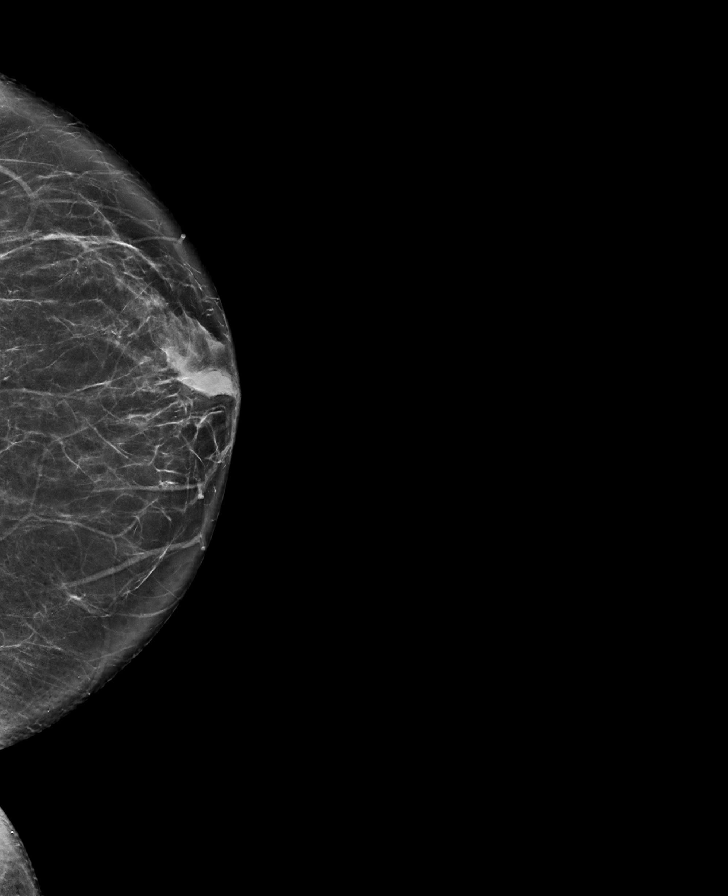

[R MLO synth-2D (2 of 2)]
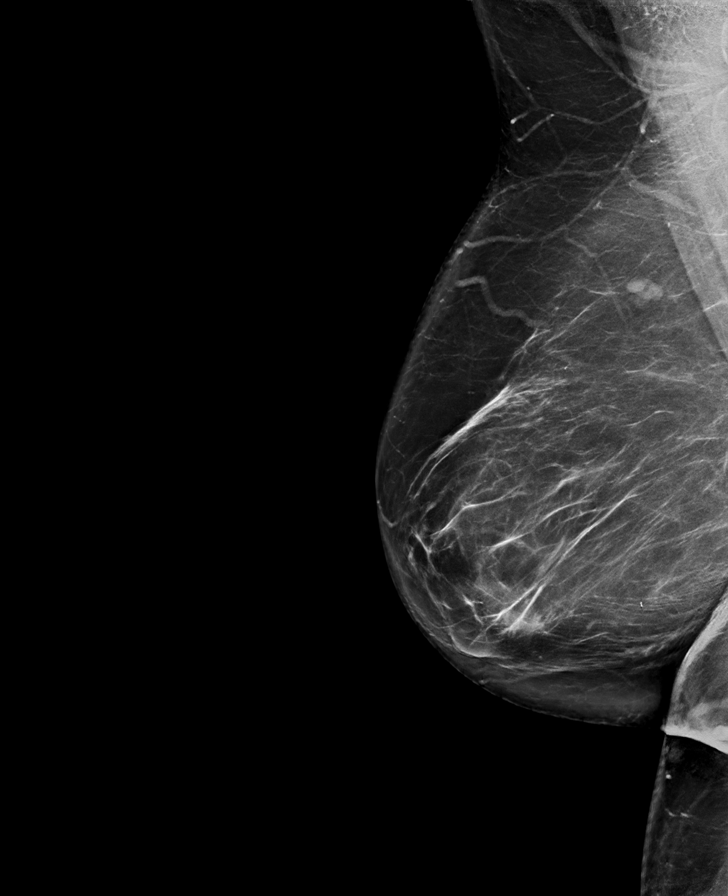

[L CC tomo · tomo slice 39/76.0]
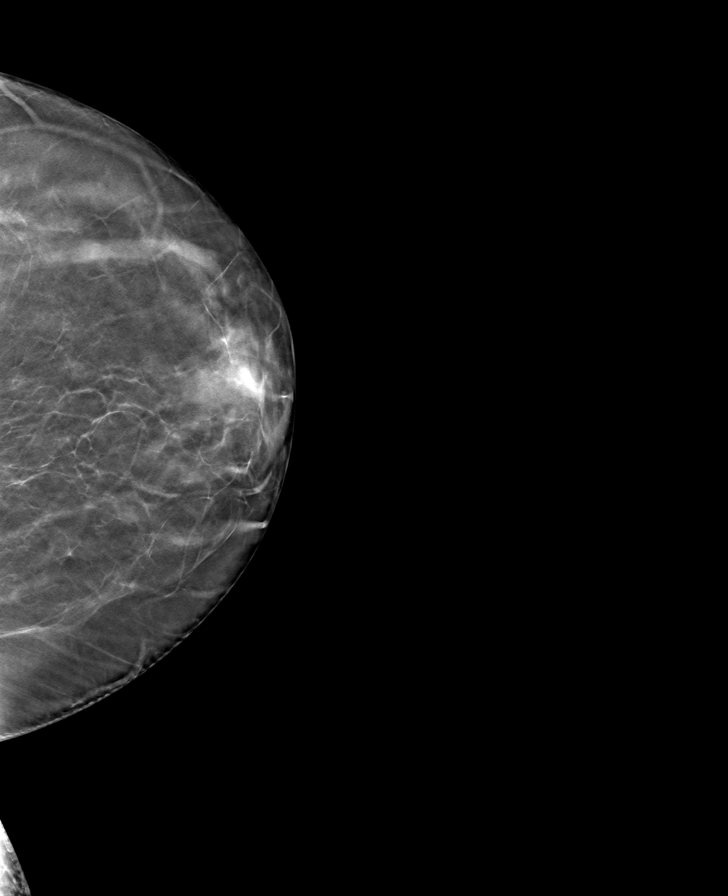

[8 of 40 positions shown; findings below may reference images not displayed]

ACR Breast Density Category b: There are scattered areas of
fibroglandular density.
FINDINGS: There are no findings suspicious for malignancy.
IMPRESSION: No mammographic evidence of malignancy. A result letter of this
screening mammogram will be mailed directly to the patient.

RECOMMENDATION:
Screening mammogram in one year. (Code:XG-X-X7B)

BI-RADS CATEGORY  1: Negative.

## 2023-03-08 DIAGNOSIS — Z1211 Encounter for screening for malignant neoplasm of colon: Secondary | ICD-10-CM | POA: Diagnosis not present

## 2023-03-11 DIAGNOSIS — G4733 Obstructive sleep apnea (adult) (pediatric): Secondary | ICD-10-CM | POA: Diagnosis not present

## 2023-03-11 DIAGNOSIS — I10 Essential (primary) hypertension: Secondary | ICD-10-CM | POA: Diagnosis not present

## 2023-03-11 DIAGNOSIS — Z8673 Personal history of transient ischemic attack (TIA), and cerebral infarction without residual deficits: Secondary | ICD-10-CM | POA: Diagnosis not present

## 2023-03-14 DIAGNOSIS — Z794 Long term (current) use of insulin: Secondary | ICD-10-CM | POA: Diagnosis not present

## 2023-03-14 DIAGNOSIS — M14671 Charcot's joint, right ankle and foot: Secondary | ICD-10-CM | POA: Diagnosis not present

## 2023-03-14 DIAGNOSIS — L97511 Non-pressure chronic ulcer of other part of right foot limited to breakdown of skin: Secondary | ICD-10-CM | POA: Diagnosis not present

## 2023-03-14 DIAGNOSIS — E1140 Type 2 diabetes mellitus with diabetic neuropathy, unspecified: Secondary | ICD-10-CM | POA: Diagnosis not present

## 2023-03-16 LAB — COLOGUARD: COLOGUARD: NEGATIVE

## 2023-03-16 LAB — EXTERNAL GENERIC LAB PROCEDURE: COLOGUARD: NEGATIVE

## 2023-03-20 ENCOUNTER — Encounter: Payer: Self-pay | Admitting: Internal Medicine

## 2023-03-20 ENCOUNTER — Emergency Department: Payer: PPO

## 2023-03-20 ENCOUNTER — Inpatient Hospital Stay: Admit: 2023-03-20 | Payer: PPO

## 2023-03-20 ENCOUNTER — Other Ambulatory Visit: Payer: Self-pay

## 2023-03-20 ENCOUNTER — Inpatient Hospital Stay
Admission: EM | Admit: 2023-03-20 | Discharge: 2023-03-27 | DRG: 871 | Disposition: A | Discharge status: Home Health | Payer: PPO | Attending: Internal Medicine | Admitting: Internal Medicine

## 2023-03-20 ENCOUNTER — Inpatient Hospital Stay
Admit: 2023-03-20 | Discharge: 2023-03-20 | Disposition: A | Discharge status: Home / Self Care | Payer: PPO | Attending: Student

## 2023-03-20 DIAGNOSIS — R0902 Hypoxemia: Secondary | ICD-10-CM

## 2023-03-20 DIAGNOSIS — M549 Dorsalgia, unspecified: Secondary | ICD-10-CM | POA: Diagnosis not present

## 2023-03-20 DIAGNOSIS — L97419 Non-pressure chronic ulcer of right heel and midfoot with unspecified severity: Secondary | ICD-10-CM | POA: Diagnosis present

## 2023-03-20 DIAGNOSIS — I2510 Atherosclerotic heart disease of native coronary artery without angina pectoris: Secondary | ICD-10-CM

## 2023-03-20 DIAGNOSIS — I420 Dilated cardiomyopathy: Secondary | ICD-10-CM | POA: Diagnosis present

## 2023-03-20 DIAGNOSIS — Z9861 Coronary angioplasty status: Secondary | ICD-10-CM | POA: Diagnosis not present

## 2023-03-20 DIAGNOSIS — Z7902 Long term (current) use of antithrombotics/antiplatelets: Secondary | ICD-10-CM

## 2023-03-20 DIAGNOSIS — J189 Pneumonia, unspecified organism: Secondary | ICD-10-CM | POA: Diagnosis present

## 2023-03-20 DIAGNOSIS — E1165 Type 2 diabetes mellitus with hyperglycemia: Secondary | ICD-10-CM | POA: Diagnosis present

## 2023-03-20 DIAGNOSIS — I2119 ST elevation (STEMI) myocardial infarction involving other coronary artery of inferior wall: Secondary | ICD-10-CM | POA: Diagnosis not present

## 2023-03-20 DIAGNOSIS — I5022 Chronic systolic (congestive) heart failure: Secondary | ICD-10-CM | POA: Insufficient documentation

## 2023-03-20 DIAGNOSIS — R509 Fever, unspecified: Secondary | ICD-10-CM | POA: Diagnosis not present

## 2023-03-20 DIAGNOSIS — I517 Cardiomegaly: Secondary | ICD-10-CM | POA: Diagnosis not present

## 2023-03-20 DIAGNOSIS — Z955 Presence of coronary angioplasty implant and graft: Secondary | ICD-10-CM

## 2023-03-20 DIAGNOSIS — E1122 Type 2 diabetes mellitus with diabetic chronic kidney disease: Secondary | ICD-10-CM | POA: Diagnosis present

## 2023-03-20 DIAGNOSIS — I340 Nonrheumatic mitral (valve) insufficiency: Secondary | ICD-10-CM | POA: Diagnosis not present

## 2023-03-20 DIAGNOSIS — J690 Pneumonitis due to inhalation of food and vomit: Secondary | ICD-10-CM | POA: Diagnosis not present

## 2023-03-20 DIAGNOSIS — A4151 Sepsis due to Escherichia coli [E. coli]: Secondary | ICD-10-CM | POA: Diagnosis not present

## 2023-03-20 DIAGNOSIS — J9601 Acute respiratory failure with hypoxia: Secondary | ICD-10-CM | POA: Diagnosis present

## 2023-03-20 DIAGNOSIS — I5023 Acute on chronic systolic (congestive) heart failure: Secondary | ICD-10-CM | POA: Diagnosis present

## 2023-03-20 DIAGNOSIS — I214 Non-ST elevation (NSTEMI) myocardial infarction: Secondary | ICD-10-CM | POA: Diagnosis not present

## 2023-03-20 DIAGNOSIS — R6520 Severe sepsis without septic shock: Secondary | ICD-10-CM | POA: Diagnosis not present

## 2023-03-20 DIAGNOSIS — E11319 Type 2 diabetes mellitus with unspecified diabetic retinopathy without macular edema: Secondary | ICD-10-CM | POA: Diagnosis not present

## 2023-03-20 DIAGNOSIS — R300 Dysuria: Secondary | ICD-10-CM | POA: Diagnosis not present

## 2023-03-20 DIAGNOSIS — R7881 Bacteremia: Secondary | ICD-10-CM

## 2023-03-20 DIAGNOSIS — Z79899 Other long term (current) drug therapy: Secondary | ICD-10-CM

## 2023-03-20 DIAGNOSIS — B9620 Unspecified Escherichia coli [E. coli] as the cause of diseases classified elsewhere: Secondary | ICD-10-CM | POA: Diagnosis not present

## 2023-03-20 DIAGNOSIS — R739 Hyperglycemia, unspecified: Secondary | ICD-10-CM | POA: Diagnosis not present

## 2023-03-20 DIAGNOSIS — Z1152 Encounter for screening for COVID-19: Secondary | ICD-10-CM | POA: Diagnosis not present

## 2023-03-20 DIAGNOSIS — A419 Sepsis, unspecified organism: Principal | ICD-10-CM | POA: Diagnosis present

## 2023-03-20 DIAGNOSIS — R109 Unspecified abdominal pain: Secondary | ICD-10-CM | POA: Diagnosis not present

## 2023-03-20 DIAGNOSIS — I25119 Atherosclerotic heart disease of native coronary artery with unspecified angina pectoris: Secondary | ICD-10-CM | POA: Diagnosis not present

## 2023-03-20 DIAGNOSIS — A4189 Other specified sepsis: Secondary | ICD-10-CM | POA: Diagnosis not present

## 2023-03-20 DIAGNOSIS — K219 Gastro-esophageal reflux disease without esophagitis: Secondary | ICD-10-CM | POA: Diagnosis present

## 2023-03-20 DIAGNOSIS — N1832 Chronic kidney disease, stage 3b: Secondary | ICD-10-CM | POA: Diagnosis not present

## 2023-03-20 DIAGNOSIS — Z888 Allergy status to other drugs, medicaments and biological substances status: Secondary | ICD-10-CM

## 2023-03-20 DIAGNOSIS — Z794 Long term (current) use of insulin: Secondary | ICD-10-CM

## 2023-03-20 DIAGNOSIS — Z6841 Body Mass Index (BMI) 40.0 and over, adult: Secondary | ICD-10-CM

## 2023-03-20 DIAGNOSIS — E11621 Type 2 diabetes mellitus with foot ulcer: Secondary | ICD-10-CM | POA: Diagnosis not present

## 2023-03-20 DIAGNOSIS — N390 Urinary tract infection, site not specified: Secondary | ICD-10-CM | POA: Diagnosis not present

## 2023-03-20 DIAGNOSIS — I10 Essential (primary) hypertension: Secondary | ICD-10-CM | POA: Diagnosis not present

## 2023-03-20 DIAGNOSIS — I330 Acute and subacute infective endocarditis: Secondary | ICD-10-CM | POA: Diagnosis not present

## 2023-03-20 DIAGNOSIS — E11610 Type 2 diabetes mellitus with diabetic neuropathic arthropathy: Secondary | ICD-10-CM | POA: Diagnosis not present

## 2023-03-20 DIAGNOSIS — I509 Heart failure, unspecified: Secondary | ICD-10-CM

## 2023-03-20 DIAGNOSIS — Z8673 Personal history of transient ischemic attack (TIA), and cerebral infarction without residual deficits: Secondary | ICD-10-CM

## 2023-03-20 DIAGNOSIS — E041 Nontoxic single thyroid nodule: Secondary | ICD-10-CM | POA: Diagnosis not present

## 2023-03-20 DIAGNOSIS — E1140 Type 2 diabetes mellitus with diabetic neuropathy, unspecified: Secondary | ICD-10-CM | POA: Diagnosis not present

## 2023-03-20 DIAGNOSIS — E872 Acidosis, unspecified: Secondary | ICD-10-CM | POA: Diagnosis present

## 2023-03-20 DIAGNOSIS — I252 Old myocardial infarction: Secondary | ICD-10-CM

## 2023-03-20 DIAGNOSIS — Z7984 Long term (current) use of oral hypoglycemic drugs: Secondary | ICD-10-CM

## 2023-03-20 DIAGNOSIS — R579 Shock, unspecified: Secondary | ICD-10-CM | POA: Diagnosis not present

## 2023-03-20 DIAGNOSIS — I130 Hypertensive heart and chronic kidney disease with heart failure and stage 1 through stage 4 chronic kidney disease, or unspecified chronic kidney disease: Secondary | ICD-10-CM | POA: Diagnosis present

## 2023-03-20 DIAGNOSIS — E785 Hyperlipidemia, unspecified: Secondary | ICD-10-CM | POA: Diagnosis not present

## 2023-03-20 DIAGNOSIS — Z9841 Cataract extraction status, right eye: Secondary | ICD-10-CM

## 2023-03-20 DIAGNOSIS — Z7982 Long term (current) use of aspirin: Secondary | ICD-10-CM

## 2023-03-20 DIAGNOSIS — R112 Nausea with vomiting, unspecified: Secondary | ICD-10-CM | POA: Diagnosis not present

## 2023-03-20 DIAGNOSIS — J188 Other pneumonia, unspecified organism: Secondary | ICD-10-CM | POA: Diagnosis not present

## 2023-03-20 DIAGNOSIS — Z961 Presence of intraocular lens: Secondary | ICD-10-CM | POA: Diagnosis present

## 2023-03-20 DIAGNOSIS — R918 Other nonspecific abnormal finding of lung field: Secondary | ICD-10-CM | POA: Diagnosis not present

## 2023-03-20 DIAGNOSIS — N12 Tubulo-interstitial nephritis, not specified as acute or chronic: Secondary | ICD-10-CM | POA: Diagnosis present

## 2023-03-20 DIAGNOSIS — G4733 Obstructive sleep apnea (adult) (pediatric): Secondary | ICD-10-CM | POA: Diagnosis present

## 2023-03-20 DIAGNOSIS — Z87440 Personal history of urinary (tract) infections: Secondary | ICD-10-CM

## 2023-03-20 DIAGNOSIS — R0989 Other specified symptoms and signs involving the circulatory and respiratory systems: Secondary | ICD-10-CM | POA: Diagnosis not present

## 2023-03-20 DIAGNOSIS — I959 Hypotension, unspecified: Secondary | ICD-10-CM | POA: Diagnosis not present

## 2023-03-20 DIAGNOSIS — I110 Hypertensive heart disease with heart failure: Secondary | ICD-10-CM | POA: Diagnosis not present

## 2023-03-20 DIAGNOSIS — Z882 Allergy status to sulfonamides status: Secondary | ICD-10-CM

## 2023-03-20 DIAGNOSIS — R000 Tachycardia, unspecified: Secondary | ICD-10-CM | POA: Diagnosis not present

## 2023-03-20 DIAGNOSIS — I255 Ischemic cardiomyopathy: Secondary | ICD-10-CM | POA: Diagnosis present

## 2023-03-20 LAB — URINALYSIS, W/ REFLEX TO CULTURE (INFECTION SUSPECTED)
Bilirubin Urine: NEGATIVE
Glucose, UA: >=500 mg/dL — AB
Ketones, ur: NEGATIVE mg/dL
Nitrite: NEGATIVE
Protein, ur: 30 mg/dL — AB
Specific Gravity, Urine: 1.017 (ref 1.005–1.030)
WBC, UA: >50 WBC/hpf (ref 0–5)
pH: 5 (ref 5.0–8.0)

## 2023-03-20 LAB — CBC WITH DIFFERENTIAL/PLATELET
Abs Immature Granulocytes: 0.03 10*3/uL (ref 0.00–0.07)
Basophils Absolute: 0 10*3/uL (ref 0.0–0.1)
Basophils Relative: 1 %
Eosinophils Absolute: 0.1 10*3/uL (ref 0.0–0.5)
Eosinophils Relative: 1 %
HCT: 40.2 % (ref 36.0–46.0)
Hemoglobin: 13.5 g/dL (ref 12.0–15.0)
Immature Granulocytes: 0 %
Lymphocytes Relative: 7 %
Lymphs Abs: 0.5 10*3/uL — ABNORMAL LOW (ref 0.7–4.0)
MCH: 30.3 pg (ref 26.0–34.0)
MCHC: 33.6 g/dL (ref 30.0–36.0)
MCV: 90.3 fL (ref 80.0–100.0)
Monocytes Absolute: 0.1 10*3/uL (ref 0.1–1.0)
Monocytes Relative: 2 %
Neutro Abs: 7.2 10*3/uL (ref 1.7–7.7)
Neutrophils Relative %: 89 %
Platelets: 308 10*3/uL (ref 150–400)
RBC: 4.45 MIL/uL (ref 3.87–5.11)
RDW: 13.9 % (ref 11.5–15.5)
WBC: 8 10*3/uL (ref 4.0–10.5)
nRBC: 0 % (ref 0.0–0.2)

## 2023-03-20 LAB — BLOOD CULTURE ID PANEL (REFLEXED) - BCID2

## 2023-03-20 LAB — HIV ANTIBODY (ROUTINE TESTING W REFLEX): HIV Screen 4th Generation wRfx: NONREACTIVE

## 2023-03-20 LAB — ECHOCARDIOGRAM COMPLETE
AR max vel: 1.58 cm2
AV Area VTI: 1.67 cm2
AV Area mean vel: 1.62 cm2
AV Mean grad: 3 mm[Hg]
AV Peak grad: 5 mm[Hg]
Ao pk vel: 1.11 m/s
Area-P 1/2: 5.75 cm2
Calc EF: 44 %
Height: 65 in
MV VTI: 1.49 cm2
S' Lateral: 2.9 cm
Single Plane A2C EF: 42.8 %
Single Plane A4C EF: 40.2 %
Weight: 5088 [oz_av]

## 2023-03-20 LAB — COMPREHENSIVE METABOLIC PANEL
ALT: 19 U/L (ref 0–44)
AST: 27 U/L (ref 15–41)
Albumin: 3.7 g/dL (ref 3.5–5.0)
Alkaline Phosphatase: 76 U/L (ref 38–126)
Anion gap: 12 (ref 5–15)
BUN: 49 mg/dL — ABNORMAL HIGH (ref 6–20)
CO2: 21 mmol/L — ABNORMAL LOW (ref 22–32)
Calcium: 9.2 mg/dL (ref 8.9–10.3)
Chloride: 106 mmol/L (ref 98–111)
Creatinine, Ser: 1.57 mg/dL — ABNORMAL HIGH (ref 0.44–1.00)
GFR, Estimated: 38 mL/min — ABNORMAL LOW (ref >60)
Glucose, Bld: 289 mg/dL — ABNORMAL HIGH (ref 70–99)
Potassium: 3.6 mmol/L (ref 3.5–5.1)
Sodium: 139 mmol/L (ref 135–145)
Total Bilirubin: 1 mg/dL (ref 0.3–1.2)
Total Protein: 7.7 g/dL (ref 6.5–8.1)

## 2023-03-20 LAB — TROPONIN I (HIGH SENSITIVITY)
Troponin I (High Sensitivity): 507 ng/L (ref <18)
Troponin I (High Sensitivity): 2906 ng/L (ref <18)
Troponin I (High Sensitivity): 10699 ng/L (ref <18)

## 2023-03-20 LAB — BLOOD GAS, VENOUS
Acid-base deficit: 2.5 mmol/L — ABNORMAL HIGH (ref 0.0–2.0)
Bicarbonate: 21.7 mmol/L (ref 20.0–28.0)
O2 Saturation: 76.6 %
Patient temperature: 37
pCO2, Ven: 35 mm[Hg] — ABNORMAL LOW (ref 44–60)
pH, Ven: 7.4 (ref 7.25–7.43)
pO2, Ven: 45 mm[Hg] (ref 32–45)

## 2023-03-20 LAB — RESP PANEL BY RT-PCR (RSV, FLU A&B, COVID)  RVPGX2
Influenza A by PCR: NEGATIVE
Influenza B by PCR: NEGATIVE
Resp Syncytial Virus by PCR: NEGATIVE
SARS Coronavirus 2 by RT PCR: NEGATIVE

## 2023-03-20 LAB — LACTIC ACID, PLASMA
Lactic Acid, Venous: 2.4 mmol/L (ref 0.5–1.9)
Lactic Acid, Venous: 2 mmol/L (ref 0.5–1.9)

## 2023-03-20 LAB — HEMOGLOBIN A1C
Hgb A1c MFr Bld: 8.3 % — ABNORMAL HIGH (ref 4.8–5.6)
Mean Plasma Glucose: 191.51 mg/dL

## 2023-03-20 LAB — STREP PNEUMONIAE URINARY ANTIGEN: Strep Pneumo Urinary Antigen: NEGATIVE

## 2023-03-20 LAB — HEPARIN LEVEL (UNFRACTIONATED)
Heparin Unfractionated: <0.1 [IU]/mL — ABNORMAL LOW (ref 0.30–0.70)
Heparin Unfractionated: >1.1 [IU]/mL — ABNORMAL HIGH (ref 0.30–0.70)
Heparin Unfractionated: 0.26 [IU]/mL — ABNORMAL LOW (ref 0.30–0.70)

## 2023-03-20 LAB — CBG MONITORING, ED
Glucose-Capillary: 314 mg/dL — ABNORMAL HIGH (ref 70–99)
Glucose-Capillary: 319 mg/dL — ABNORMAL HIGH (ref 70–99)
Glucose-Capillary: 244 mg/dL — ABNORMAL HIGH (ref 70–99)

## 2023-03-20 LAB — BRAIN NATRIURETIC PEPTIDE: B Natriuretic Peptide: 59 pg/mL (ref 0.0–100.0)

## 2023-03-20 LAB — PROTIME-INR
INR: 1.1 (ref 0.8–1.2)
Prothrombin Time: 14.1 s (ref 11.4–15.2)

## 2023-03-20 LAB — APTT: aPTT: 25 s (ref 24–36)

## 2023-03-20 MED ORDER — ALBUTEROL SULFATE (2.5 MG/3ML) 0.083% IN NEBU: 3.0000 mL | INHALATION_SOLUTION | RESPIRATORY_TRACT | Status: DC | PRN

## 2023-03-20 MED ORDER — HEPARIN BOLUS VIA INFUSION
2000.0000 [IU] | Freq: Once | INTRAVENOUS | Status: AC
  Administered 2023-03-20: 2000 [IU] via INTRAVENOUS
  Filled 2023-03-20: qty 2000

## 2023-03-20 MED ORDER — BUDESONIDE 0.5 MG/2ML IN SUSP
2.0000 mg | Freq: Two times a day (BID) | RESPIRATORY_TRACT | Status: DC
  Administered 2023-03-20 – 2023-03-23 (×7): 2 mg via RESPIRATORY_TRACT
  Administered 2023-03-24: 0.5 mg via RESPIRATORY_TRACT
  Filled 2023-03-20 (×9): qty 8

## 2023-03-20 MED ORDER — INSULIN GLARGINE-YFGN 100 UNIT/ML ~~LOC~~ SOLN
10.0000 [IU] | Freq: Every day | SUBCUTANEOUS | Status: DC
  Administered 2023-03-20 – 2023-03-22 (×3): 10 [IU] via SUBCUTANEOUS
  Filled 2023-03-20 (×4): qty 0.1

## 2023-03-20 MED ORDER — SODIUM CHLORIDE 0.9 % IV BOLUS
1000.0000 mL | Freq: Once | INTRAVENOUS | Status: AC
  Administered 2023-03-20: 1000 mL via INTRAVENOUS

## 2023-03-20 MED ORDER — HEPARIN BOLUS VIA INFUSION
4000.0000 [IU] | Freq: Once | INTRAVENOUS | Status: AC
  Administered 2023-03-20: 4000 [IU] via INTRAVENOUS
  Filled 2023-03-20: qty 4000

## 2023-03-20 MED ORDER — PERFLUTREN LIPID MICROSPHERE
1.0000 mL | INTRAVENOUS | Status: AC | PRN
  Administered 2023-03-20: 2 mL via INTRAVENOUS

## 2023-03-20 MED ORDER — ATORVASTATIN CALCIUM 20 MG PO TABS
40.0000 mg | ORAL_TABLET | Freq: Every day | ORAL | Status: DC
  Administered 2023-03-20 – 2023-03-26 (×7): 40 mg via ORAL
  Filled 2023-03-20 (×7): qty 2

## 2023-03-20 MED ORDER — OXYBUTYNIN CHLORIDE ER 10 MG PO TB24
10.0000 mg | ORAL_TABLET | Freq: Every day | ORAL | Status: DC
  Administered 2023-03-20 – 2023-03-27 (×8): 10 mg via ORAL
  Filled 2023-03-20 (×8): qty 1

## 2023-03-20 MED ORDER — INSULIN ASPART 100 UNIT/ML IJ SOLN
0.0000 [IU] | Freq: Every day | INTRAMUSCULAR | Status: DC
  Administered 2023-03-20: 2 [IU] via SUBCUTANEOUS
  Administered 2023-03-22 – 2023-03-23 (×2): 4 [IU] via SUBCUTANEOUS
  Administered 2023-03-24: 3 [IU] via SUBCUTANEOUS
  Filled 2023-03-20 (×4): qty 1

## 2023-03-20 MED ORDER — ONDANSETRON HCL 4 MG/2ML IJ SOLN
4.0000 mg | Freq: Once | INTRAMUSCULAR | Status: AC
  Administered 2023-03-20: 4 mg via INTRAVENOUS
  Filled 2023-03-20: qty 2

## 2023-03-20 MED ORDER — SODIUM CHLORIDE 0.9 % IV SOLN
500.0000 mg | Freq: Once | INTRAVENOUS | Status: AC
  Administered 2023-03-20: 500 mg via INTRAVENOUS
  Filled 2023-03-20: qty 5

## 2023-03-20 MED ORDER — ASPIRIN 81 MG PO TBEC
81.0000 mg | DELAYED_RELEASE_TABLET | Freq: Every day | ORAL | Status: DC
  Administered 2023-03-20 – 2023-03-25 (×6): 81 mg via ORAL
  Filled 2023-03-20 (×6): qty 1

## 2023-03-20 MED ORDER — ONDANSETRON HCL 4 MG/2ML IJ SOLN
4.0000 mg | Freq: Four times a day (QID) | INTRAMUSCULAR | Status: DC | PRN
  Administered 2023-03-21: 4 mg via INTRAVENOUS
  Filled 2023-03-20: qty 2

## 2023-03-20 MED ORDER — IPRATROPIUM-ALBUTEROL 0.5-2.5 (3) MG/3ML IN SOLN
3.0000 mL | Freq: Four times a day (QID) | RESPIRATORY_TRACT | Status: DC
  Administered 2023-03-20 – 2023-03-21 (×4): 3 mL via RESPIRATORY_TRACT
  Filled 2023-03-20 (×4): qty 3

## 2023-03-20 MED ORDER — LACTATED RINGERS IV BOLUS (SEPSIS)
2000.0000 mL | Freq: Once | INTRAVENOUS | Status: AC
  Administered 2023-03-20: 2000 mL via INTRAVENOUS

## 2023-03-20 MED ORDER — CLOPIDOGREL BISULFATE 75 MG PO TABS
75.0000 mg | ORAL_TABLET | Freq: Every day | ORAL | Status: DC
  Administered 2023-03-20 – 2023-03-27 (×8): 75 mg via ORAL
  Filled 2023-03-20 (×8): qty 1

## 2023-03-20 MED ORDER — HEPARIN (PORCINE) 25000 UT/250ML-% IV SOLN
1900.0000 [IU]/h | INTRAVENOUS | Status: DC
  Administered 2023-03-20: 1200 [IU]/h via INTRAVENOUS
  Administered 2023-03-21: 1900 [IU]/h via INTRAVENOUS
  Administered 2023-03-21: 1700 [IU]/h via INTRAVENOUS
  Administered 2023-03-22 – 2023-03-23 (×3): 1900 [IU]/h via INTRAVENOUS
  Filled 2023-03-20 (×6): qty 250

## 2023-03-20 MED ORDER — SODIUM CHLORIDE 0.9 % IV SOLN
1.0000 g | Freq: Once | INTRAVENOUS | Status: AC
  Administered 2023-03-20: 1 g via INTRAVENOUS
  Filled 2023-03-20: qty 10

## 2023-03-20 MED ORDER — ENOXAPARIN SODIUM 80 MG/0.8ML IJ SOSY
70.0000 mg | PREFILLED_SYRINGE | INTRAMUSCULAR | Status: DC
  Filled 2023-03-20: qty 0.7

## 2023-03-20 MED ORDER — AZITHROMYCIN 250 MG PO TABS
500.0000 mg | ORAL_TABLET | Freq: Every day | ORAL | Status: AC
  Administered 2023-03-21 – 2023-03-24 (×4): 500 mg via ORAL
  Filled 2023-03-20 (×4): qty 2

## 2023-03-20 MED ORDER — IOHEXOL 350 MG/ML SOLN
100.0000 mL | Freq: Once | INTRAVENOUS | Status: AC | PRN
  Administered 2023-03-20: 100 mL via INTRAVENOUS

## 2023-03-20 MED ORDER — GUAIFENESIN-DM 100-10 MG/5ML PO SYRP: 10.0000 mL | ORAL_SOLUTION | Freq: Four times a day (QID) | ORAL | Status: DC | PRN

## 2023-03-20 MED ORDER — ACETAMINOPHEN 325 MG PO TABS
650.0000 mg | ORAL_TABLET | Freq: Four times a day (QID) | ORAL | Status: DC | PRN
  Administered 2023-03-21: 650 mg via ORAL
  Filled 2023-03-20: qty 2

## 2023-03-20 MED ORDER — HEPARIN BOLUS VIA INFUSION
1400.0000 [IU] | Freq: Once | INTRAVENOUS | Status: AC
  Administered 2023-03-21: 1400 [IU] via INTRAVENOUS
  Filled 2023-03-20: qty 1400

## 2023-03-20 MED ORDER — KETOROLAC TROMETHAMINE 15 MG/ML IJ SOLN
15.0000 mg | Freq: Once | INTRAMUSCULAR | Status: AC
  Administered 2023-03-20: 15 mg via INTRAVENOUS
  Filled 2023-03-20: qty 1

## 2023-03-20 MED ORDER — INSULIN ASPART 100 UNIT/ML IJ SOLN
0.0000 [IU] | Freq: Three times a day (TID) | INTRAMUSCULAR | Status: DC
  Administered 2023-03-20 (×2): 15 [IU] via SUBCUTANEOUS
  Administered 2023-03-21 – 2023-03-22 (×4): 7 [IU] via SUBCUTANEOUS
  Administered 2023-03-22: 15 [IU] via SUBCUTANEOUS
  Administered 2023-03-22: 4 [IU] via SUBCUTANEOUS
  Administered 2023-03-23: 11 [IU] via SUBCUTANEOUS
  Administered 2023-03-23: 7 [IU] via SUBCUTANEOUS
  Administered 2023-03-23: 11 [IU] via SUBCUTANEOUS
  Administered 2023-03-24: 20 [IU] via SUBCUTANEOUS
  Administered 2023-03-24: 7 [IU] via SUBCUTANEOUS
  Administered 2023-03-24: 11 [IU] via SUBCUTANEOUS
  Administered 2023-03-25: 6 [IU] via SUBCUTANEOUS
  Filled 2023-03-20 (×14): qty 1

## 2023-03-20 MED ORDER — SODIUM CHLORIDE 0.9 % IV SOLN
2.0000 g | INTRAVENOUS | Status: DC
  Administered 2023-03-21 – 2023-03-27 (×7): 2 g via INTRAVENOUS
  Filled 2023-03-20 (×8): qty 20

## 2023-03-20 MED ORDER — LINAGLIPTIN 5 MG PO TABS
5.0000 mg | ORAL_TABLET | Freq: Every day | ORAL | Status: DC
  Administered 2023-03-22 – 2023-03-27 (×5): 5 mg via ORAL
  Filled 2023-03-20 (×6): qty 1

## 2023-03-20 NOTE — ED Provider Notes (Addendum)
[facility] Provider Note    Event Date/Time   First MD Initiated Contact with Patient 03/20/23 [phone]     (approximate)   History   Back Pain and Nausea   HPI  Julia Simmons is a 62 y.o. female   Past medical history of diabetes, hypertension, markedly elevated BMI, presents to the emergency department with dysuria and right-sided flank pain for the last 1 day.  She has had chills.  EMS reports that she is febrile in the low 100s.  She has no respiratory complaints and denies chest pain shortness of breath cough or congestion.  She has no abdominal pain.  Independent historians: EMS gives report as above  External Medical Documents Reviewed: Echo obtained in 2021 shows an EF of 60 to 65%, and an emergency department visit dated March 2024 she was diagnosed with pyelonephritis and given an outpatient course of antibiotics      Physical Exam   Triage Vital Signs: ED Triage Vitals  Encounter Vitals Group     BP 03/20/23 0547 (!) 76/37     Systolic BP Percentile --      Diastolic BP Percentile --      Pulse Rate 03/20/23 0547 (!) 126     Resp 03/20/23 0547 (!) 24     Temp 03/20/23 0547 98.3 F (36.8 C)     Temp Source 03/20/23 0547 Oral     SpO2 03/20/23 0547 (!) 85 %     Weight --      Height --      Head Circumference --      Peak Flow --      Pain Score 03/20/23 0552 7     Pain Loc --      Pain Education --      Exclude from Growth Chart --     Most recent vital signs: Vitals:   03/20/23 0605 03/20/23 0645  BP: (!) 71/52 (!) 87/59  Pulse: (!) 118 (!) 108  Resp: (!) 31 (!) 22  Temp:    SpO2: 93% 94%    General: Awake, no distress.  CV:  Good peripheral perfusion.  Resp:  Normal effort.  Abd:  No distention.  Other:  Oxygen saturations to 85% without respiratory distress, clear lungs, with respirations of 24 and a heart rate of 126 and hypotensive 70s over 30s.  Afebrile.  Right CVA tenderness.  Soft nontender abdomen D  palpation all quadrants without rigidity or guarding.  Awake alert oriented, mentation is normal   ED Results / Procedures / Treatments   Labs (all labs ordered are listed, but only abnormal results are displayed) Labs Reviewed  LACTIC ACID, PLASMA - Abnormal; Notable for the following components:      Result Value   Lactic Acid, Venous 2.4 (*)    All other components within normal limits  CBC WITH DIFFERENTIAL/PLATELET - Abnormal; Notable for the following components:   Lymphs Abs 0.5 (*)    All other components within normal limits  BLOOD GAS, VENOUS - Abnormal; Notable for the following components:   pCO2, Ven 35 (*)    Acid-base deficit 2.5 (*)    All other components within normal limits  CULTURE, BLOOD (ROUTINE X 2)  CULTURE, BLOOD (ROUTINE X 2)  RESP PANEL BY RT-PCR (RSV, FLU A&B, COVID)  [id number]  LACTIC ACID, PLASMA  COMPREHENSIVE METABOLIC PANEL  URINALYSIS, W/ REFLEX TO CULTURE (INFECTION SUSPECTED)  BRAIN NATRIURETIC PEPTIDE  TROPONIN I (HIGH SENSITIVITY)  I ordered and reviewed the above labs they are notable for white blood cell count is normal but lactic is elevated   EKG  ED ECG REPORT I, Pilar Jarvis, the attending physician, personally viewed and interpreted this ECG.   Date: 03/20/2023  EKG Time: 0555  Rate: 121  Rhythm: sinus tachycardia  Axis: nl  Intervals:none  ST&T Change: no stemi    RADIOLOGY I independently reviewed and interpreted chest x-ray and see left-sided opacity which may represent pleural effusion or pneumonia I also reviewed radiologist's formal read.   PROCEDURES:  Critical Care performed: Yes, see critical care procedure note(s)  .Critical Care  Performed by: Pilar Jarvis, MD Authorized by: Pilar Jarvis, MD   Critical care provider statement:    Critical care time (minutes):  30   Critical care was time spent personally by me on the following activities:  Development of treatment plan with patient or surrogate,  discussions with consultants, evaluation of patient's response to treatment, examination of patient, ordering and review of laboratory studies, ordering and review of radiographic studies, ordering and performing treatments and interventions, pulse oximetry, re-evaluation of patient's condition and review of old charts    MEDICATIONS ORDERED IN ED: Medications  lactated ringers bolus 2,000 mL (2,000 mLs Intravenous New Bag/Given 03/20/23 0604)  cefTRIAXone (ROCEPHIN) 1 g in sodium chloride 0.9 % 100 mL IVPB (1 g Intravenous New Bag/Given 03/20/23 0625)  ondansetron (ZOFRAN) injection 4 mg (4 mg Intravenous Given 03/20/23 0617)  ketorolac (TORADOL) 15 MG/ML injection 15 mg (15 mg Intravenous Given 03/20/23 0627)     IMPRESSION / MDM / ASSESSMENT AND PLAN / ED COURSE  I reviewed the triage vital signs and the nursing notes.                                Patient's presentation is most consistent with acute presentation with potential threat to life or bodily function.  Differential diagnosis includes, but is not limited to, urinary tract infection, pyelonephritis, sepsis, intra-abdominal infection like appendicitis/ diverticulitis/cholecystitis, ureterolithiasis   The patient is on the cardiac monitor to evaluate for evidence of arrhythmia and/or significant heart rate changes.  MDM:    62 year old woman with diabetes here with concerns for sepsis given tachycardia, reported fever by EMS, tachypnea and hypotension in the setting of suspected urinary tract infection or pyelonephritis, so will obtain sepsis labs including blood cultures lactic and empirically treat with IV Rocephin and 30 cc/kg ideal body weight fluid bolus.  She has hypoxemia which is new, 85% with no respiratory distress, and no respiratory complaints or chest pain and clear lungs, will give oxygen support while sepsis workup ongoing, obtain chest x-ray and viral swabs.  Admission.    Considered PE given her vital sign  abnormalities and hypoxemia, the patient does deny chest pain or respiratory complaints but is very dismissive and not very forthcoming with medical complaints, so when obtaining CT scans I will obtain a CT angiogram of the chest as well as an abdomen and pelvis to check for PE in addition to sepsis workup for flank pain or abdominal infectious etiologies.  I considered CHF exacerbation though again she denies any chest pain shortness of breath and there is no marked peripheral edema though it is difficult to assess in the setting of her body habitus.  Will check chest x-ray CT imaging as above, proBNP   --- Sepsis reevaluation --nearly completed 2000 cc of lactated Ringer's  and blood pressure has improved markedly now 110 systolic, tachycardia improved to the low 100s.  Still pending urinalysis and imaging.  FINAL CLINICAL IMPRESSION(S) / ED DIAGNOSES   Final diagnoses:  Sepsis, due to unspecified organism, unspecified whether acute organ dysfunction present (HCC)  Right flank pain  Dysuria  Hypoxemia     Rx / DC Orders   ED Discharge Orders     None        Note:  This document was prepared using Dragon voice recognition software and may include unintentional dictation errors.    Pilar Jarvis, MD 03/20/23 4696    Pilar Jarvis, MD 03/20/23 2952    Pilar Jarvis, MD 03/20/23 [phone]

## 2023-03-20 NOTE — ED Notes (Signed)
Echo at bedside

## 2023-03-20 NOTE — Progress Notes (Signed)
*  PRELIMINARY RESULTS* Echocardiogram 2D Echocardiogram has been performed.  Julia Simmons 03/20/2023, 1:02 PM

## 2023-03-20 NOTE — H&P (Addendum)
History and Physical    SAELAH GNIADEK AOZ:[id number] DOB: 09-27-1960 DOA: 03/20/2023  PCP: Patrice Paradise, MD (Confirm with patient/family/NH records and if not entered, this has to be entered at [facility] point of entry) Patient coming from: Home  I have personally briefly reviewed patient's old medical records in [facility]  Chief Complaint: Feeling sick  HPI: Julia Simmons is a 62 y.o. female with medical history significant of CAD with LAD stenting x 2 in 2023, chronic HFrEF and HFpEF with LVEF 35-40% in 2023, HTN, IDDM with insulin resistance, HLD, morbid obesity, OSA on CPAP at bedtime, CKD stage II, frequent UTIs, presented with malaise, dysuria, chills.  Symptoms started 3 to 4 days ago, and when patient started to develop dysuria with burning sensations which she attributed to recurrent UTIs.  She has had multiple UTIs last year and this year she had 2 episodes of UTIs, with most recent episode happened 2 weeks ago for which patient received 5 days course of antibiotics.  This time however she decided to use over-the-counter Azo and cranberry juice instead of antibiotics.  Last 2 days she has had frequent episode of chills but no fever, and she denied any abdominal pain chest pain shortness of breath or diarrhea.  This morning, patient woke up feeling nauseous, and after drinking some cranberry juice she thought up 3 times of stomach content none bile nonbloody.  Then patient started develop shortness of breath and cough and family called EMS.  ED Course: Tachycardia, hypotensive and hypoxic blood pressure 76/37 O2 saturation 85% on room air.  Chest x-ray showed multifocal pneumonia and patient was started on ceftriaxone and azithromycin.  Blood work showed creatinine 1.5 which is about her baseline, K3.6, bicarb 21, WBC 8.0, troponin 507, VBG 7 point 4/35/45.  Patient was also given a total of 2.0 L of IV bolus in the ED.  Review of Systems: As per HPI otherwise 14 point review of  systems negative.    Past Medical History:  Diagnosis Date   Charcot's joint of foot    Coronary artery disease    Diabetes mellitus with neuropathy (HCC)    Diabetic retinopathy (HCC)    Hypertension    Morbid obesity with BMI of 50.0-59.9, adult (HCC)    Sleep apnea    Stroke [facility])     Past Surgical History:  Procedure Laterality Date   CATARACT EXTRACTION W/PHACO Right 07/18/2022   Procedure: CATARACT EXTRACTION PHACO AND INTRAOCULAR LENS PLACEMENT (IOC) RIGHT DIABETIC HEALON 5 VISION BLUE 33.79 2:31.3;  Surgeon: Lockie Mola, MD;  Location: [facility];  Service: Ophthalmology;  Laterality: Right;   CESAREAN SECTION       reports that she has never smoked. She has never used smokeless tobacco. She reports that she does not currently use alcohol. She reports that she does not use drugs.  Allergies  Allergen Reactions   Hydrochlorothiazide Swelling    Facial swelling   Sulfa Antibiotics Swelling    Facial swelling    Family History  Problem Relation Age of Onset   Breast cancer Neg Hx      Prior to Admission medications   Medication Sig Start Date End Date Taking? Authorizing Provider  albuterol (VENTOLIN HFA) 108 (90 Base) MCG/ACT inhaler Inhale into the lungs. 11/08/21   [provider]  aspirin EC 81 MG tablet Take 81 mg by mouth daily.    [provider]  atorvastatin (LIPITOR) 40 MG tablet Take by mouth. 12/15/21  [provider]  BD INSULIN SYRINGE U/F 31G X 5/16" 1 ML MISC USE AS DIRECTED ONCE DAILY WITH LEVEMIR (VIAL). Patient not taking: Reported on 07/09/2022 11/07/21   [provider]  Continuous Blood Gluc Sensor (FREESTYLE LIBRE SENSOR SYSTEM) MISC Use 1 kit every 14 (fourteen) days for glucose monitoring 12/25/21   [provider]  furosemide (LASIX) 40 MG tablet Take 1.5 tablets (60 mg total) by mouth daily. 06/17/20   Burnadette Pop, MD  guaiFENesin-dextromethorphan (ROBITUSSIN DM) 100-10 MG/5ML  syrup Take 10 mLs by mouth every 6 (six) hours as needed for cough. 06/17/20   Burnadette Pop, MD  Insulin Aspart FlexPen (NOVOLOG) 100 UNIT/ML Inject 50 units before breakfast, lunch and supper 09/04/21   [provider]  insulin glargine (LANTUS) 100 UNIT/ML injection Inject 100 Units into the skin at bedtime.    [provider]  Insulin Glargine-Lixisenatide (SOLIQUA) 100-33 UNT-MCG/ML SOPN Inject into the skin. 11/10/21   [provider]  metFORMIN (GLUCOPHAGE) 500 MG tablet Take 1,000 mg by mouth daily with breakfast.    [provider]  metFORMIN (GLUCOPHAGE) 500 MG tablet Take 1,000 mg by mouth daily with supper.    [provider]  metoprolol succinate (TOPROL-XL) 100 MG 24 hr tablet Take 100 mg by mouth daily. 05/09/20   [provider]  oxybutynin (DITROPAN-XL) 10 MG 24 hr tablet Take 1 tablet by mouth daily. 11/03/21   [provider]  polyethylene glycol (MIRALAX) 17 g packet Take 17 g by mouth daily as needed for moderate constipation. Patient not taking: Reported on 07/09/2022 06/17/20   Burnadette Pop, MD  potassium chloride 20 MEQ TBCR Take 20 mEq by mouth daily. Patient not taking: Reported on 07/09/2022 06/17/20   Burnadette Pop, MD  sacubitril-valsartan (ENTRESTO) 24-26 MG Take 1 tablet by mouth every 12 (twelve) hours. 12/15/21   [provider]  spironolactone (ALDACTONE) 25 MG tablet Take by mouth. 12/15/21   [provider]  Vitamin D, Ergocalciferol, 2000 units CAPS Take 2,000 Units by mouth daily. Patient not taking: Reported on 07/18/2022    [provider]    Physical Exam: Vitals:   03/20/23 0700 03/20/23 0730 03/20/23 0800 03/20/23 0829  BP: (!) 101/58 122/75 (!) 106/58 (!) 106/58  Pulse: (!) 105 (!) 105 (!) 103 (!) 103  Resp: (!) 21 18 (!) 24 (!) 24  Temp:      TempSrc:      SpO2:    95%  Weight:      Height:        Constitutional: NAD, calm, comfortable Vitals:    03/20/23 0700 03/20/23 0730 03/20/23 0800 03/20/23 0829  BP: (!) 101/58 122/75 (!) 106/58 (!) 106/58  Pulse: (!) 105 (!) 105 (!) 103 (!) 103  Resp: (!) 21 18 (!) 24 (!) 24  Temp:      TempSrc:      SpO2:    95%  Weight:      Height:       Eyes: PERRL, lids and conjunctivae normal ENMT: Mucous membranes are dry. Posterior pharynx clear of any exudate or lesions.Normal dentition.  Neck: normal, supple, no masses, no thyromegaly Respiratory: clear to auscultation bilaterally, no wheezing, predominantly crackles on the left side, increasing respiratory effort. No accessory muscle use.  Cardiovascular: Regular rate and rhythm, no murmurs / rubs / gallops. No extremity edema. 2+ pedal pulses. No carotid bruits.  Abdomen: no tenderness, no masses palpated. No hepatosplenomegaly. Bowel sounds positive.  Musculoskeletal: no clubbing /  cyanosis. No joint deformity upper and lower extremities. Good ROM, no contractures. Normal muscle tone.  Skin: Chronic shallow ulcer on right bottom of the foot, bottom of this clean no discharge. Neurologic: CN 2-12 grossly intact. Sensation intact, DTR normal. Strength 5/5 in all 4.  Psychiatric: Normal judgment and insight. Alert and oriented x 3. Normal mood.     Labs on Admission: I have personally reviewed following labs and imaging studies  CBC: Recent Labs  Lab 03/20/23 0558  WBC 8.0  NEUTROABS 7.2  HGB 13.5  HCT 40.2  MCV 90.3  PLT 308   Basic Metabolic Panel: Recent Labs  Lab 03/20/23 0558  NA 139  K 3.6  CL 106  CO2 21*  GLUCOSE 289*  BUN 49*  CREATININE 1.57*  CALCIUM 9.2   GFR: Estimated Creatinine Clearance: 55.3 mL/min (A) (by C-G formula based on SCr of 1.57 mg/dL (H)). Liver Function Tests: Recent Labs  Lab 03/20/23 0558  AST 27  ALT 19  ALKPHOS 76  BILITOT 1.0  PROT 7.7  ALBUMIN 3.7   No results for input(s): "LIPASE", "AMYLASE" in the last 168 hours. No results for input(s): "AMMONIA" in the last 168  hours. Coagulation Profile: No results for input(s): "INR", "PROTIME" in the last 168 hours. Cardiac Enzymes: No results for input(s): "CKTOTAL", "CKMB", "CKMBINDEX", "TROPONINI" in the last 168 hours. BNP (last 3 results) No results for input(s): "PROBNP" in the last 8760 hours. HbA1C: No results for input(s): "HGBA1C" in the last 72 hours. CBG: No results for input(s): "GLUCAP" in the last 168 hours. Lipid Profile: No results for input(s): "CHOL", "HDL", "LDLCALC", "TRIG", "CHOLHDL", "LDLDIRECT" in the last 72 hours. Thyroid Function Tests: No results for input(s): "TSH", "T4TOTAL", "FREET4", "T3FREE", "THYROIDAB" in the last 72 hours. Anemia Panel: No results for input(s): "VITAMINB12", "FOLATE", "FERRITIN", "TIBC", "IRON", "RETICCTPCT" in the last 72 hours. Urine analysis:    Component Value Date/Time   COLORURINE STRAW (A) 08/24/2022 0920   APPEARANCEUR CLEAR (A) 08/24/2022 0920   LABSPEC 1.007 08/24/2022 0920   PHURINE 5.0 08/24/2022 0920   GLUCOSEU >=500 (A) 08/24/2022 0920   HGBUR NEGATIVE 08/24/2022 0920   BILIRUBINUR NEGATIVE 08/24/2022 0920   KETONESUR NEGATIVE 08/24/2022 0920   PROTEINUR NEGATIVE 08/24/2022 0920   NITRITE POSITIVE (A) 08/24/2022 0920   LEUKOCYTESUR SMALL (A) 08/24/2022 0920    Radiological Exams on Admission: DG Chest Port 1 View  Result Date: 03/20/2023 CLINICAL DATA:  Questionable sepsis. Fever chills, back pain, vomiting and nausea. EXAM: PORTABLE CHEST 1 VIEW COMPARISON:  Portable chest 12/03/2021 FINDINGS: There is mild cardiomegaly.  Central vessels are normal caliber. There are patchy opacities of the left lung fields most likely due to multilobar pneumonia, with a small layering pleural effusion. Clinical correlation and radiographic follow-up to clearing recommended. Right lung is clear. The mediastinum is normally outlined.  No acute osseous findings. IMPRESSION: Patchy opacities of the left lung most likely due to multilobar pneumonia, with  a small layering left pleural effusion. Clinical correlation and radiographic follow-up to clearing recommended. Electronically Signed   By: Almira Bar M.D.   On: 03/20/2023 07:01    EKG: Independently reviewed.  Sinus rhythm, minimal ST elevation in the V2-V4  Assessment/Plan Principal Problem:   Sepsis (HCC) Active Problems:   PNA (pneumonia)   Morbid obesity with BMI of 50.0-59.9, adult (HCC)   Sepsis secondary to UTI [facility])   CAD S/P percutaneous coronary angioplasty  (please populate well all problems here in Problem List. (For example,  if patient is on BP meds at home and you resume or decide to hold them, it is a problem that needs to be her. Same for CAD, COPD, HLD and so on)  Sepsis -Evidenced by hypotension, elevated lactate, new onset hypoxia, with source infection likely recurrent UTI as well as new onset of aspiration pneumonia -Continue ceftriaxone and azithromycin to double cover UTI and pneumonia.  Review of patient urine culture outpatient showed most recent urine culture is E. coli pansensitive about 1 month ago. -Clinically patient appears to be volume depleted still, will give 1 more liters IV bolus and monitor volume status as patient has significant history of CHF with low LVEF. -Hold off all her home BP meds/CHF meds including Lasix Entresto -Other DDx, patient does have a chronic right midfoot pressure ulcer, has been followed with podiatry with most recent office visit last week with debridement.  Clinically the wound appeared to be clean no signs of active infection.  UTI -Recurrent, management as above -CT abdominal pelvis to rule out pyelonephritis, patient denies any back pain  Aspiration pneumonia -Likely from repeated vomiting this morning, predominantly on left side of the lungs -Management as above -Zofran as needed  Acute hypoxic respiratory failure -2/2 PNA, management as above  Elevated troponins -Denied that any chest pains.  Uptrending  troponins 500> 1900, Case was discussed with [facility] cardiology Dr. Corky Sing, who recommend repeat EKG and start heparin drip and [facility] cardiology will see the patient emergently this morning. -EKG showed minimum ST changes in V2-V4, clinically suspect these changes are chronic, communicated with patient's cardiology [facility] cardiology, repeat EKG pending  Chronic combined HFrEF and HFpEF -Volume depleted and septic -1 more liter IV bolus ordered -And O's and daily weight and monitor breathing status, will repeat chest x-ray tomorrow -Hold off metoprolol and Entresto and Lasix today  CKD stage II -Volume depleted, creatinine level stable  IDDM with hyperglycemia -Continue Lantus 100 units daily -Change metformin to Januvia given that the patient has a elevated lactate level -SSI  Morbid obesity -Outpatient bariatric evaluation  OSA -CPAP at bedtime  DVT prophylaxis: Lovenox Code Status: Full code Family Communication: Husband at bedside Disposition Plan: Patient is sick with sepsis requiring IV fluid and IV antibiotics, expect more than 2 midnight hospital stay Consults called: None Admission status: PCU   Emeline General MD [facility] Pager [phone]  03/20/2023, 8:44 AM

## 2023-03-20 NOTE — ED Notes (Addendum)
Pt ambulated to the bathroom at this time. Gait at baseline. Pt ouput 

## 2023-03-20 NOTE — Progress Notes (Signed)
PHARMACIST - PHYSICIAN COMMUNICATION  CONCERNING:  Enoxaparin (Lovenox) for DVT Prophylaxis    RECOMMENDATION: Patient was prescribed enoxaparin 40mg  q24 hours for VTE prophylaxis.   Filed Weights   03/20/23 0552  Weight: (!) 144.2 kg (318 lb)    Body mass index is 52.92 kg/m.  Estimated Creatinine Clearance: 55.3 mL/min (A) (by C-G formula based on SCr of 1.57 mg/dL (H)).  Based on [facility] policy patient is candidate for enoxaparin 0.5mg /kg TBW SQ every 24 hours based on BMI being >30.  DESCRIPTION: Pharmacy has adjusted enoxaparin dose per [facility] policy.  Patient is now receiving enoxaparin 70 mg every 24 hours   Tressie Ellis 03/20/2023 8:47 AM

## 2023-03-20 NOTE — Progress Notes (Signed)
PHARMACY - PHYSICIAN COMMUNICATION CRITICAL VALUE ALERT - BLOOD CULTURE IDENTIFICATION (BCID)  Julia Simmons is an 62 y.o. female who presented to [facility] on 03/20/2023 with a chief complaint of dysuria and malaise.   Assessment:  1/4 bottles growing E. Coli (no resistance on BCID)  Name of physician (or Provider) Contacted: Dr. Arville Care  Current antibiotics: ceftriaxone  Changes to prescribed antibiotics recommended:  Patient is on recommended antibiotics - No changes needed  Results for orders placed or performed during the hospital encounter of 03/20/23  Blood Culture ID Panel (Reflexed) (Collected: 03/20/2023  5:58 AM)  Result Value Ref Range   Enterococcus faecalis NOT DETECTED NOT DETECTED   Enterococcus Faecium NOT DETECTED NOT DETECTED   Listeria monocytogenes NOT DETECTED NOT DETECTED   Staphylococcus species NOT DETECTED NOT DETECTED   Staphylococcus aureus (BCID) NOT DETECTED NOT DETECTED   Staphylococcus epidermidis NOT DETECTED NOT DETECTED   Staphylococcus lugdunensis NOT DETECTED NOT DETECTED   Streptococcus species NOT DETECTED NOT DETECTED   Streptococcus agalactiae NOT DETECTED NOT DETECTED   Streptococcus pneumoniae NOT DETECTED NOT DETECTED   Streptococcus pyogenes NOT DETECTED NOT DETECTED   A.calcoaceticus-baumannii NOT DETECTED NOT DETECTED   Bacteroides fragilis NOT DETECTED NOT DETECTED   Enterobacterales DETECTED (A) NOT DETECTED   Enterobacter cloacae complex NOT DETECTED NOT DETECTED   Escherichia coli DETECTED (A) NOT DETECTED   Klebsiella aerogenes NOT DETECTED NOT DETECTED   Klebsiella oxytoca NOT DETECTED NOT DETECTED   Klebsiella pneumoniae NOT DETECTED NOT DETECTED   Proteus species NOT DETECTED NOT DETECTED   Salmonella species NOT DETECTED NOT DETECTED   Serratia marcescens NOT DETECTED NOT DETECTED   Haemophilus influenzae NOT DETECTED NOT DETECTED   Neisseria meningitidis NOT DETECTED NOT DETECTED   Pseudomonas aeruginosa NOT DETECTED  NOT DETECTED   Stenotrophomonas maltophilia NOT DETECTED NOT DETECTED   Candida albicans NOT DETECTED NOT DETECTED   Candida auris NOT DETECTED NOT DETECTED   Candida glabrata NOT DETECTED NOT DETECTED   Candida krusei NOT DETECTED NOT DETECTED   Candida parapsilosis NOT DETECTED NOT DETECTED   Candida tropicalis NOT DETECTED NOT DETECTED   Cryptococcus neoformans/gattii NOT DETECTED NOT DETECTED   CTX-M ESBL NOT DETECTED NOT DETECTED   Carbapenem resistance IMP NOT DETECTED NOT DETECTED   Carbapenem resistance KPC NOT DETECTED NOT DETECTED   Carbapenem resistance NDM NOT DETECTED NOT DETECTED   Carbapenem resist OXA 48 LIKE NOT DETECTED NOT DETECTED   Carbapenem resistance VIM NOT DETECTED NOT DETECTED    Rockwell Alexandria 03/20/2023  7:59 PM

## 2023-03-20 NOTE — Progress Notes (Signed)
PHARMACY - ANTICOAGULATION CONSULT NOTE  Pharmacy Consult for Heparin Infusion Indication: chest pain/ACS  Allergies  Allergen Reactions   Hydrochlorothiazide Swelling    Facial swelling   Sulfa Antibiotics Swelling    Facial swelling    Patient Measurements: Height: 5\' 5"  (165.1 cm) Weight: (!) 144.2 kg (318 lb) IBW/kg (Calculated) : 57 Heparin Dosing Weight: 93.1 kg  Vital Signs: Temp: 97.9 F (36.6 C) (10/02 1335) Temp Source: Oral (10/02 1335) BP: 121/74 (10/02 1530) Pulse Rate: 79 (10/02 1530)  Labs: Recent Labs    03/20/23 0558 03/20/23 0822 03/20/23 1415  HGB 13.5  --   --   HCT 40.2  --   --   PLT 308  --   --   APTT  --  25  --   LABPROT  --  14.1  --   INR  --  1.1  --   HEPARINUNFRC  --   --  <0.10*  CREATININE 1.57*  --   --   TROPONINIHS 507* 2,906* 10,699*    Estimated Creatinine Clearance: 55.3 mL/min (A) (by C-G formula based on SCr of 1.57 mg/dL (H)).   Medical History: Past Medical History:  Diagnosis Date   Charcot's joint of foot    Coronary artery disease    Diabetes mellitus with neuropathy (HCC)    Diabetic retinopathy (HCC)    Hypertension    Morbid obesity with BMI of 50.0-59.9, adult (HCC)    Sleep apnea    Stroke [facility])      Assessment: Patient is a 62 year old female with a past medical history of CAD with LAD stenting x 2 in 2023, chronic HFrEF and HFpEF with LVEF 35-40% in 2023, HTN, IDDM with insulin resistance, HLD, morbid obesity, OSA on CPAP at bedtime, CKD stage II, and frequent UTIs who presented with malaise, dysuria, and chills. Pharmacy was consulted to initiate patient on a heparin infusion for ACS/chest pain. Patient was not on anticoagulation prior to admission, therefore it is appropriate to monitor via heparin levels.  No signs or symptoms of bleeding noted. Hgb 13.5. PLT 308. Baseline INR and aPTT ordered.  10/2 1415 HL < 0.10  Goal of Therapy:  Heparin level 0.3-0.7 units/ml Monitor platelets by  anticoagulation protocol: Yes   Plan:  Heparin subtherapeutic although drawn 2 hours early Give reduced bolus of 2000 units bolus x 1 Increase heparin infusion to 1500 units/hr Check anti-Xa level in 6 hours and daily while on heparin Continue to monitor H&H and platelets  Sharen Hones, PharmD, BCPS Clinical Pharmacist   03/20/2023,3:52 PM

## 2023-03-20 NOTE — ED Triage Notes (Signed)
Patient arrives from home with complaint of back pain, nausea, vomiting and fever at home with chills.  Patient believes she has a UTI, because this is what happens when she has a UTI.

## 2023-03-20 NOTE — Progress Notes (Signed)
PHARMACY - ANTICOAGULATION CONSULT NOTE  Pharmacy Consult for Heparin Infusion Indication: chest pain/ACS  Allergies  Allergen Reactions   Hydrochlorothiazide Swelling    Facial swelling   Sulfa Antibiotics Swelling    Facial swelling    Patient Measurements: Height: 5\' 5"  (165.1 cm) Weight: (!) 144.2 kg (318 lb) IBW/kg (Calculated) : 57 Heparin Dosing Weight: 93.1 kg  Vital Signs: Temp: 97.9 F (36.6 C) (10/02 1335) Temp Source: Oral (10/02 1335) BP: 119/74 (10/02 2100) Pulse Rate: 87 (10/02 2330)  Labs: Recent Labs    03/20/23 0558 03/20/23 0822 03/20/23 1415 03/20/23 1635 03/20/23 2238  HGB 13.5  --   --   --   --   HCT 40.2  --   --   --   --   PLT 308  --   --   --   --   APTT  --  25  --   --   --   LABPROT  --  14.1  --   --   --   INR  --  1.1  --   --   --   HEPARINUNFRC  --   --  <0.10* >1.10* 0.26*  CREATININE 1.57*  --   --   --   --   TROPONINIHS 507* 2,906* 10,699*  --   --     Estimated Creatinine Clearance: 55.3 mL/min (A) (by C-G formula based on SCr of 1.57 mg/dL (H)).   Medical History: Past Medical History:  Diagnosis Date   Charcot's joint of foot    Coronary artery disease    Diabetes mellitus with neuropathy (HCC)    Diabetic retinopathy (HCC)    Hypertension    Morbid obesity with BMI of 50.0-59.9, adult (HCC)    Sleep apnea    Stroke [facility])      Assessment: Patient is a 62 year old female with a past medical history of CAD with LAD stenting x 2 in 2023, chronic HFrEF and HFpEF with LVEF 35-40% in 2023, HTN, IDDM with insulin resistance, HLD, morbid obesity, OSA on CPAP at bedtime, CKD stage II, and frequent UTIs who presented with malaise, dysuria, and chills. Pharmacy was consulted to initiate patient on a heparin infusion for ACS/chest pain. Patient was not on anticoagulation prior to admission, therefore it is appropriate to monitor via heparin levels.  No signs or symptoms of bleeding noted. Hgb 13.5. PLT 308. Baseline INR  and aPTT ordered.  10/2 1415 HL < 0.10 10/02 2238 HL 0.26, subtherapeutic  Goal of Therapy:  Heparin level 0.3-0.7 units/ml Monitor platelets by anticoagulation protocol: Yes   Plan:  Bolus 1400 units x 1 Increase heparin infusion to 1700 units/hr Check anti-Xa level in 6 hours after rate change Continue to monitor H&H and platelets  Otelia Sergeant, PharmD, [facility] 03/20/2023 11:58 PM

## 2023-03-20 NOTE — Consult Note (Addendum)
[facility] CLINIC CARDIOLOGY CONSULT NOTE       Patient ID: Julia Simmons MRN: [id number] DOB/AGE: 10/20/1960 62 y.o.  Admit date: 03/20/2023 Referring Physician Dr. Mikey College Primary Physician Dr. Maurine Minister Primary Cardiologist Dr. Darrold Junker Reason for Consultation NSTEMI  HPI: Julia Simmons is a 62 y.o. female  with a past medical history of coronary artery disease s/p DES to LAD 11/2021, ischemic cardiomyopathy, chronic HFrEF (EF 35-40% 11/2021), R MCA CVA 11/2021, hypertension, hyperlipidemia, type 2 diabetes, OSA on CPAP, morbid obesity who presented to the ED on 03/20/2023 for malaise, dysuria. Cardiology was consulted for further evaluation.   Patient states that for the last few days she has had worsening dysuria which she tried to manage with over-the-counter Azo.  Also reports noticing fever and chills for the last 2 days.  Patient reports that around 4 AM today she became very nauseous and had multiple episodes of vomiting.  She is unsure if she aspirated any of her vomit.  States that after this episode she did become short of breath with associated cough and was brought to the ED by her husband for further evaluation.  Workup in the ED notable for creatinine 1.57, potassium 3.6, WBC 8.0, hemoglobin 13.5.  Troponin found to be elevated at 507 and trended up to 2900.  BNP normal at 59.  Lactic acid elevated in the ED at 2.4.  Found to be hypoxic and thus she was started on supplemental oxygen in the ED. She was hypotensive in the ED at 76/37 and was started on IV fluids as well as IV antibiotics.  EKG with sinus tachycardia and some new ST-T wave changes which improved on later EKG as her heart rate improved.  At the time of my evaluation this afternoon patient is resting in ED stretcher.  States that she continues to have dysuria symptoms, nausea has improved but she continues to feel short of breath.  She denies any chest pain.  States that she has not had any issues with  functional decline recently.  States that she has unable to walk far at baseline because of foot pain but is able to complete activities in her home without limitations.  Denies any episodes of chest pain, exertional dyspnea, new or worsening edema.  She remains on 4 L nasal cannula.  She was initially tachycardic in the ED but this is improved.   Review of systems complete and found to be negative unless listed above    Past Medical History:  Diagnosis Date   Charcot's joint of foot    Coronary artery disease    Diabetes mellitus with neuropathy (HCC)    Diabetic retinopathy (HCC)    Hypertension    Morbid obesity with BMI of 50.0-59.9, adult (HCC)    Sleep apnea    Stroke [facility])     Past Surgical History:  Procedure Laterality Date   CATARACT EXTRACTION W/PHACO Right 07/18/2022   Procedure: CATARACT EXTRACTION PHACO AND INTRAOCULAR LENS PLACEMENT (IOC) RIGHT DIABETIC HEALON 5 VISION BLUE 33.79 2:31.3;  Surgeon: Lockie Mola, MD;  Location: [facility];  Service: Ophthalmology;  Laterality: Right;   CESAREAN SECTION      (Not in a hospital admission)  Social History   Socioeconomic History   Marital status: Married    Spouse name: Not on file   Number of children: Not on file   Years of education: Not on file   Highest education level: Not on file  Occupational History   Occupation:  disability  Tobacco Use   Smoking status: Never   Smokeless tobacco: Never  Vaping Use   Vaping status: Never Used  Substance and Sexual Activity   Alcohol use: Not Currently   Drug use: Never   Sexual activity: Not on file  Other Topics Concern   Not on file  Social History Narrative   Not on file   Social Determinants of Health   Financial Resource Strain: Low Risk  (02/25/2023)   Received from [facility]   Overall Financial Resource Strain (CARDIA)    Difficulty of Paying Living Expenses: Not hard at all  Food Insecurity: No Food Insecurity  (03/20/2023)   Hunger Vital Sign    Worried About Running Out of Food in the Last Year: Never true    Ran Out of Food in the Last Year: Never true  Transportation Needs: No Transportation Needs (03/20/2023)   PRAPARE - Administrator, Civil Service (Medical): No    Lack of Transportation (Non-Medical): No  Physical Activity: Not on file  Stress: Patient Declined (12/03/2021)   Received from [facility], [facility] - Occupational Stress Questionnaire    Feeling of Stress : Patient declined  Social Connections: Unknown (12/03/2021)   Received from [facility]   Social Network    Social Network: Not on file  Intimate Partner Violence: Not At Risk (03/20/2023)   Humiliation, Afraid, Rape, and Kick questionnaire    Fear of Current or Ex-Partner: No    Emotionally Abused: No    Physically Abused: No    Sexually Abused: No    Family History  Problem Relation Age of Onset   Breast cancer Neg Hx      Vitals:   03/20/23 1130 03/20/23 1230 03/20/23 1335 03/20/23 1400  BP: 107/73 105/64  105/65  Pulse: 88 94  85  Resp:  18  (!) 27  Temp:   97.9 F (36.6 C)   TempSrc:   Oral   SpO2:  95%    Weight:      Height:        PHYSICAL EXAM General: Ill-appearing, well nourished, in no acute distress laying nearly flat in ED stretcher with husband at bedside. HEENT: Normocephalic and atraumatic. Neck: No JVD.  Lungs: Normal respiratory effort on 4L Selz. Clear bilaterally to auscultation. No wheezes, crackles, rhonchi.  Heart: HRRR. Normal S1 and S2 without gallops or murmurs.  Abdomen: Non-distended appearing.  Msk: Normal strength and tone for age. Extremities: Warm and well perfused. No clubbing, cyanosis.  No edema.  Neuro: Alert and oriented X 3. Psych: Answers questions appropriately.   Labs: Basic Metabolic Panel: Recent Labs    03/20/23 0558  NA 139  K 3.6  CL 106  CO2 21*  GLUCOSE 289*  BUN 49*   CREATININE 1.57*  CALCIUM 9.2   Liver Function Tests: Recent Labs    03/20/23 0558  AST 27  ALT 19  ALKPHOS 76  BILITOT 1.0  PROT 7.7  ALBUMIN 3.7   No results for input(s): "LIPASE", "AMYLASE" in the last 72 hours. CBC: Recent Labs    03/20/23 0558  WBC 8.0  NEUTROABS 7.2  HGB 13.5  HCT 40.2  MCV 90.3  PLT 308   Cardiac Enzymes: Recent Labs    03/20/23 0558 03/20/23 0822 03/20/23 1415  TROPONINIHS 507* 2,906* 10,699*   BNP: Recent Labs    03/20/23 0558  BNP 59.0  D-Dimer: No results for input(s): "DDIMER" in the last 72 hours. Hemoglobin A1C: Recent Labs    03/20/23 0822  HGBA1C 8.3*   Fasting Lipid Panel: No results for input(s): "CHOL", "HDL", "LDLCALC", "TRIG", "CHOLHDL", "LDLDIRECT" in the last 72 hours. Thyroid Function Tests: No results for input(s): "TSH", "T4TOTAL", "T3FREE", "THYROIDAB" in the last 72 hours.  Invalid input(s): "FREET3" Anemia Panel: No results for input(s): "VITAMINB12", "FOLATE", "FERRITIN", "TIBC", "IRON", "RETICCTPCT" in the last 72 hours.   Radiology: CT Angio Chest PE W/Cm &/Or Wo Cm  Result Date: 03/20/2023 CLINICAL DATA:  Dysuria, right flank pain, chills, fever. EXAM: CT ANGIOGRAPHY CHEST CT ABDOMEN AND PELVIS WITH CONTRAST TECHNIQUE: Multidetector CT imaging of the chest was performed using the standard protocol during bolus administration of intravenous contrast. Multiplanar CT image reconstructions and MIPs were obtained to evaluate the vascular anatomy. Multidetector CT imaging of the abdomen and pelvis was performed using the standard protocol during bolus administration of intravenous contrast. RADIATION DOSE REDUCTION: This exam was performed according to the departmental dose-optimization program which includes automated exposure control, adjustment of the mA and/or kV according to patient size and/or use of iterative reconstruction technique. CONTRAST:  OMNIPAQUE IOHEXOL 350 MG/ML SOLN COMPARISON:   Same-day chest radiograph FINDINGS: CTA CHEST FINDINGS Cardiovascular: There is adequate opacification of the pulmonary arteries to the segmental level. There is no evidence of pulmonary embolism. The heart size is normal. There is no pericardial effusion. There is significant age accelerated coronary artery calcification. Mediastinum/Nodes: There is a rim calcified thyroid nodule in the inferior isthmus requiring no specific imaging follow-up. The esophagus is grossly unremarkable. There is no mediastinal, hilar, or axillary lymphadenopathy. Lungs/Pleura: There is inward bowing of the posterior wall of the trachea with significant luminal narrowing suggesting tracheomalacia. There is extensive near-complete opacification of the left lung with the additional patchy ground-glass opacities throughout the right lung. There is a trace left pleural effusion. There is no significant right effusion. There is no pneumothorax. Musculoskeletal: There is no acute osseous abnormality or suspicious osseous lesion. Review of the MIP images confirms the above findings. CT ABDOMEN and PELVIS FINDINGS Hepatobiliary: The liver is unremarkable. The gallbladder is surgically absent. There is no biliary ductal dilatation. Pancreas: Unremarkable. Spleen: Unremarkable. Adrenals/Urinary Tract: The adrenals are unremarkable. There is a small hypodense lesion in the left upper pole likely reflecting benign cysts requiring no specific imaging follow-up. There are no suspicious renal lesions. There is mild mucosal hyperenhancement of the proximal right ureter with mild surrounding fat stranding suggesting pyelitis. There is no evidence of frank pyelonephritis. There is a possible mucosal lesion in the mid right ureter (2-48, 6-96). There is mild right hydroureter. There is minimal excretion of contrast into the collecting systems on the delayed images. There is no left hydronephrosis or hydroureter or definite renal or ureteral stone on either  side. The bladder is unremarkable. Stomach/Bowel: The stomach is unremarkable. There is no evidence of bowel obstruction. There is no abnormal bowel wall thickening or inflammatory change. The appendix is normal. Vascular/Lymphatic: There is scattered calcified plaque throughout the nonaneurysmal abdominal aorta. The major branch vessels are patent. The main portal and splenic veins are patent. There is no abdominal or pelvic lymphadenopathy. Reproductive: The uterus and adnexa are unremarkable. Other: There is no ascites or free air. Musculoskeletal: There is no acute osseous abnormality or suspicious osseous lesion. There is a prominent superior endplate Schmorl's node at L2. Review of the MIP images confirms the above findings. IMPRESSION: 1. No  evidence of pulmonary embolism. 2. Near-complete opacification of the left lung with additional patchy opacities throughout the right lung suspicious for multifocal pneumonia with a trace left pleural effusion. Recommend radiographic follow-up to resolution. 3. Mild mucosal hyperenhancement of the proximal right ureter with mild surrounding fat stranding suggesting pyelitis with mild hydroureter. No evidence of frank pyelonephritis or obstructing stone. 4. Possible enhancing mucosal lesion in the right mid ureter raises the possibility of ureteral neoplasm, though this may be artifactual. Consider urologic workup when clinically appropriate. 5. Minimal excretion of contrast into the collecting systems on the delayed images; correlate with renal function. 6. Findings suggesting tracheomalacia. Electronically Signed   By: Lesia Hausen M.D.   On: 03/20/2023 09:08   CT ABDOMEN PELVIS W CONTRAST  Result Date: 03/20/2023 CLINICAL DATA:  Dysuria, right flank pain, chills, fever. EXAM: CT ANGIOGRAPHY CHEST CT ABDOMEN AND PELVIS WITH CONTRAST TECHNIQUE: Multidetector CT imaging of the chest was performed using the standard protocol during bolus administration of intravenous  contrast. Multiplanar CT image reconstructions and MIPs were obtained to evaluate the vascular anatomy. Multidetector CT imaging of the abdomen and pelvis was performed using the standard protocol during bolus administration of intravenous contrast. RADIATION DOSE REDUCTION: This exam was performed according to the departmental dose-optimization program which includes automated exposure control, adjustment of the mA and/or kV according to patient size and/or use of iterative reconstruction technique. CONTRAST:  OMNIPAQUE IOHEXOL 350 MG/ML SOLN COMPARISON:  Same-day chest radiograph FINDINGS: CTA CHEST FINDINGS Cardiovascular: There is adequate opacification of the pulmonary arteries to the segmental level. There is no evidence of pulmonary embolism. The heart size is normal. There is no pericardial effusion. There is significant age accelerated coronary artery calcification. Mediastinum/Nodes: There is a rim calcified thyroid nodule in the inferior isthmus requiring no specific imaging follow-up. The esophagus is grossly unremarkable. There is no mediastinal, hilar, or axillary lymphadenopathy. Lungs/Pleura: There is inward bowing of the posterior wall of the trachea with significant luminal narrowing suggesting tracheomalacia. There is extensive near-complete opacification of the left lung with the additional patchy ground-glass opacities throughout the right lung. There is a trace left pleural effusion. There is no significant right effusion. There is no pneumothorax. Musculoskeletal: There is no acute osseous abnormality or suspicious osseous lesion. Review of the MIP images confirms the above findings. CT ABDOMEN and PELVIS FINDINGS Hepatobiliary: The liver is unremarkable. The gallbladder is surgically absent. There is no biliary ductal dilatation. Pancreas: Unremarkable. Spleen: Unremarkable. Adrenals/Urinary Tract: The adrenals are unremarkable. There is a small hypodense lesion in the left upper pole  likely reflecting benign cysts requiring no specific imaging follow-up. There are no suspicious renal lesions. There is mild mucosal hyperenhancement of the proximal right ureter with mild surrounding fat stranding suggesting pyelitis. There is no evidence of frank pyelonephritis. There is a possible mucosal lesion in the mid right ureter (2-48, 6-96). There is mild right hydroureter. There is minimal excretion of contrast into the collecting systems on the delayed images. There is no left hydronephrosis or hydroureter or definite renal or ureteral stone on either side. The bladder is unremarkable. Stomach/Bowel: The stomach is unremarkable. There is no evidence of bowel obstruction. There is no abnormal bowel wall thickening or inflammatory change. The appendix is normal. Vascular/Lymphatic: There is scattered calcified plaque throughout the nonaneurysmal abdominal aorta. The major branch vessels are patent. The main portal and splenic veins are patent. There is no abdominal or pelvic lymphadenopathy. Reproductive: The uterus and adnexa are unremarkable. Other: There  is no ascites or free air. Musculoskeletal: There is no acute osseous abnormality or suspicious osseous lesion. There is a prominent superior endplate Schmorl's node at L2. Review of the MIP images confirms the above findings. IMPRESSION: 1. No evidence of pulmonary embolism. 2. Near-complete opacification of the left lung with additional patchy opacities throughout the right lung suspicious for multifocal pneumonia with a trace left pleural effusion. Recommend radiographic follow-up to resolution. 3. Mild mucosal hyperenhancement of the proximal right ureter with mild surrounding fat stranding suggesting pyelitis with mild hydroureter. No evidence of frank pyelonephritis or obstructing stone. 4. Possible enhancing mucosal lesion in the right mid ureter raises the possibility of ureteral neoplasm, though this may be artifactual. Consider urologic  workup when clinically appropriate. 5. Minimal excretion of contrast into the collecting systems on the delayed images; correlate with renal function. 6. Findings suggesting tracheomalacia. Electronically Signed   By: Lesia Hausen M.D.   On: 03/20/2023 09:08   DG Chest Port 1 View  Result Date: 03/20/2023 CLINICAL DATA:  Questionable sepsis. Fever chills, back pain, vomiting and nausea. EXAM: PORTABLE CHEST 1 VIEW COMPARISON:  Portable chest 12/03/2021 FINDINGS: There is mild cardiomegaly.  Central vessels are normal caliber. There are patchy opacities of the left lung fields most likely due to multilobar pneumonia, with a small layering pleural effusion. Clinical correlation and radiographic follow-up to clearing recommended. Right lung is clear. The mediastinum is normally outlined.  No acute osseous findings. IMPRESSION: Patchy opacities of the left lung most likely due to multilobar pneumonia, with a small layering left pleural effusion. Clinical correlation and radiographic follow-up to clearing recommended. Electronically Signed   By: Almira Bar M.D.   On: 03/20/2023 07:01    ECHO pending  TELEMETRY reviewed by me 03/20/2023: Sinus rhythm rate 80s  EKG reviewed by me: sinus tachycardia rate 101, anterior q waves  Data reviewed by me 03/20/2023: last 24h vitals tele labs imaging I/O ED provider note, admission H&P  Principal Problem:   Sepsis (HCC) Active Problems:   PNA (pneumonia)   Morbid obesity with BMI of 50.0-59.9, adult (HCC)   Sepsis secondary to UTI (HCC)   CAD S/P percutaneous coronary angioplasty    ASSESSMENT AND PLAN:  Julia Simmons is a 62 y.o. female  with a past medical history of coronary artery disease s/p DES to LAD 11/2021, ischemic cardiomyopathy, chronic HFrEF (EF 35-40% 11/2021), R MCA CVA 11/2021, hypertension, hyperlipidemia, type 2 diabetes, OSA on CPAP, morbid obesity who presented to the ED on 03/20/2023 for malaise, dysuria. Cardiology was consulted for  further evaluation.   # NSTEMI # Coronary artery disease s/p DES to LAD 11/2021 Patient with troponin elevation from 507 > 2906.  Troponin elevation in the setting of acute respiratory failure 2/2 aspiration pneumonia and sepsis.  She is without chest pain.   -Trend troponins. -Continue heparin infusion. -Echo pending -Continue home atorvastatin 40 mg daily, aspirin 81 mg daily, Plavix 75 mg daily. -Recommend beta-blocker once blood pressure improves. -Given significant cardiac history would consider ischemic evaluation after clinical improvement and prior to discharge for further evaluation given significant elevation in cardiac enzymes.  # Sepsis  # UTI # Pneumonia Patient presenting with dysuria, urinalysis indicative of urinary tract infection.  Hypotensive and tachycardic in the ED.  CT chest with near complete whiteout of left lung.  Suspect aspiration pneumonia given her episodes of vomiting this a.m. with subsequent onset of shortness of breath.  Lactic acid elevated at 2.4.  WBC normal 8.0. -  Management per primary.   This patient's plan of care was discussed and created with Dr. Corky Sing and he is in agreement.  Signed: Gale Journey, PA-C  03/20/2023, 3:18 PM [facility]iology

## 2023-03-20 NOTE — ED Provider Notes (Signed)
Patient received in sign out from Dr. Renaldo Reel pending imaging as well as labs.  Patient is hypoxic with CT imaging findings concerning for pneumonia.  No evidence of PE on my review and interpretation.  Has received broad-spectrum antibiotics.  Blood pressure is improving after IV hydration and resuscitation.  Does appear stable and appropriate for admission to the hospital.  Hospitalist consulted for admission.   Willy Eddy, MD 03/20/23 [phone]

## 2023-03-20 NOTE — ED Notes (Signed)
Pt to CT at this time.

## 2023-03-20 NOTE — Progress Notes (Signed)
PHARMACY - ANTICOAGULATION CONSULT NOTE  Pharmacy Consult for Heparin Infusion Indication: chest pain/ACS  Allergies  Allergen Reactions   Hydrochlorothiazide Swelling    Facial swelling   Sulfa Antibiotics Swelling    Facial swelling    Patient Measurements: Height: 5\' 5"  (165.1 cm) Weight: (!) 144.2 kg (318 lb) IBW/kg (Calculated) : 57 Heparin Dosing Weight: 93.1 kg  Vital Signs: Temp: 98.3 F (36.8 C) (10/02 0547) Temp Source: Oral (10/02 0547) BP: 106/58 (10/02 0829) Pulse Rate: 103 (10/02 0829)  Labs: Recent Labs    03/20/23 0558 03/20/23 0822  HGB 13.5  --   HCT 40.2  --   PLT 308  --   CREATININE 1.57*  --   TROPONINIHS 507* 2,906*    Estimated Creatinine Clearance: 55.3 mL/min (A) (by C-G formula based on SCr of 1.57 mg/dL (H)).   Medical History: Past Medical History:  Diagnosis Date   Charcot's joint of foot    Coronary artery disease    Diabetes mellitus with neuropathy (HCC)    Diabetic retinopathy (HCC)    Hypertension    Morbid obesity with BMI of 50.0-59.9, adult (HCC)    Sleep apnea    Stroke [facility])      Assessment: Patient is a 62 year old female with a past medical history of CAD with LAD stenting x 2 in 2023, chronic HFrEF and HFpEF with LVEF 35-40% in 2023, HTN, IDDM with insulin resistance, HLD, morbid obesity, OSA on CPAP at bedtime, CKD stage II, and frequent UTIs who presented with malaise, dysuria, and chills. Pharmacy was consulted to initiate patient on a heparin infusion for ACS/chest pain. Patient was not on anticoagulation prior to admission, therefore it is appropriate to monitor via heparin levels.  No signs or symptoms of bleeding noted. Hgb 13.5. PLT 308. Baseline INR and aPTT ordered.  Goal of Therapy:  Heparin level 0.3-0.7 units/ml Monitor platelets by anticoagulation protocol: Yes   Plan:  Give 4000 units bolus x 1 Start heparin infusion at 1200 units/hr Check anti-Xa level in 6 hours and daily while on  heparin Continue to monitor H&H and platelets  Merryl Hacker, PharmD Clinical Pharmacist 03/20/2023,9:22 AM

## 2023-03-20 NOTE — ED Notes (Signed)
Message sent to pharmacy at this time. Meds have not been verified.

## 2023-03-21 ENCOUNTER — Inpatient Hospital Stay: Payer: PPO

## 2023-03-21 DIAGNOSIS — R6520 Severe sepsis without septic shock: Secondary | ICD-10-CM

## 2023-03-21 DIAGNOSIS — Z794 Long term (current) use of insulin: Secondary | ICD-10-CM

## 2023-03-21 DIAGNOSIS — I214 Non-ST elevation (NSTEMI) myocardial infarction: Secondary | ICD-10-CM | POA: Diagnosis present

## 2023-03-21 DIAGNOSIS — A4151 Sepsis due to Escherichia coli [E. coli]: Secondary | ICD-10-CM | POA: Diagnosis not present

## 2023-03-21 DIAGNOSIS — A419 Sepsis, unspecified organism: Secondary | ICD-10-CM | POA: Diagnosis not present

## 2023-03-21 DIAGNOSIS — I5023 Acute on chronic systolic (congestive) heart failure: Secondary | ICD-10-CM | POA: Diagnosis present

## 2023-03-21 DIAGNOSIS — J189 Pneumonia, unspecified organism: Secondary | ICD-10-CM | POA: Diagnosis not present

## 2023-03-21 DIAGNOSIS — N390 Urinary tract infection, site not specified: Secondary | ICD-10-CM

## 2023-03-21 DIAGNOSIS — E11610 Type 2 diabetes mellitus with diabetic neuropathic arthropathy: Secondary | ICD-10-CM

## 2023-03-21 DIAGNOSIS — J9601 Acute respiratory failure with hypoxia: Secondary | ICD-10-CM | POA: Insufficient documentation

## 2023-03-21 DIAGNOSIS — I5022 Chronic systolic (congestive) heart failure: Secondary | ICD-10-CM | POA: Insufficient documentation

## 2023-03-21 LAB — CBC
HCT: 34.8 % — ABNORMAL LOW (ref 36.0–46.0)
Hemoglobin: 11.2 g/dL — ABNORMAL LOW (ref 12.0–15.0)
MCH: 30.5 pg (ref 26.0–34.0)
MCHC: 32.2 g/dL (ref 30.0–36.0)
MCV: 94.8 fL (ref 80.0–100.0)
Platelets: 262 10*3/uL (ref 150–400)
RBC: 3.67 MIL/uL — ABNORMAL LOW (ref 3.87–5.11)
RDW: 14 % (ref 11.5–15.5)
WBC: 8.8 10*3/uL (ref 4.0–10.5)
nRBC: 0 % (ref 0.0–0.2)

## 2023-03-21 LAB — BASIC METABOLIC PANEL
Anion gap: 11 (ref 5–15)
BUN: 40 mg/dL — ABNORMAL HIGH (ref 6–20)
CO2: 22 mmol/L (ref 22–32)
Calcium: 8.5 mg/dL — ABNORMAL LOW (ref 8.9–10.3)
Chloride: 104 mmol/L (ref 98–111)
Creatinine, Ser: 1.56 mg/dL — ABNORMAL HIGH (ref 0.44–1.00)
GFR, Estimated: 38 mL/min — ABNORMAL LOW (ref >60)
Glucose, Bld: 264 mg/dL — ABNORMAL HIGH (ref 70–99)
Potassium: 3.6 mmol/L (ref 3.5–5.1)
Sodium: 137 mmol/L (ref 135–145)

## 2023-03-21 LAB — HEPARIN LEVEL (UNFRACTIONATED)
Heparin Unfractionated: 0.22 [IU]/mL — ABNORMAL LOW (ref 0.30–0.70)
Heparin Unfractionated: 0.46 [IU]/mL (ref 0.30–0.70)

## 2023-03-21 LAB — GLUCOSE, CAPILLARY
Glucose-Capillary: 215 mg/dL — ABNORMAL HIGH (ref 70–99)
Glucose-Capillary: 232 mg/dL — ABNORMAL HIGH (ref 70–99)
Glucose-Capillary: 209 mg/dL — ABNORMAL HIGH (ref 70–99)
Glucose-Capillary: 175 mg/dL — ABNORMAL HIGH (ref 70–99)

## 2023-03-21 LAB — TROPONIN I (HIGH SENSITIVITY): Troponin I (High Sensitivity): 7673 ng/L (ref <18)

## 2023-03-21 LAB — PROCALCITONIN: Procalcitonin: 24.05 ng/mL

## 2023-03-21 MED ORDER — METOPROLOL SUCCINATE ER 25 MG PO TB24: 25.0000 mg | ORAL_TABLET | Freq: Every day | ORAL | Status: DC

## 2023-03-21 MED ORDER — BACITRACIN ZINC 500 UNIT/GM EX OINT
TOPICAL_OINTMENT | Freq: Two times a day (BID) | CUTANEOUS | Status: DC
  Administered 2023-03-23 – 2023-03-27 (×4): 1 via TOPICAL
  Filled 2023-03-21 (×10): qty 0.9

## 2023-03-21 MED ORDER — METOPROLOL SUCCINATE ER 25 MG PO TB24
25.0000 mg | ORAL_TABLET | Freq: Two times a day (BID) | ORAL | Status: DC
  Administered 2023-03-21 – 2023-03-27 (×13): 25 mg via ORAL
  Filled 2023-03-21 (×14): qty 1

## 2023-03-21 MED ORDER — HEPARIN BOLUS VIA INFUSION
2500.0000 [IU] | Freq: Once | INTRAVENOUS | Status: AC
  Administered 2023-03-21: 2500 [IU] via INTRAVENOUS
  Filled 2023-03-21: qty 2500

## 2023-03-21 MED ORDER — IPRATROPIUM-ALBUTEROL 0.5-2.5 (3) MG/3ML IN SOLN
3.0000 mL | Freq: Two times a day (BID) | RESPIRATORY_TRACT | Status: DC
  Administered 2023-03-21 – 2023-03-27 (×12): 3 mL via RESPIRATORY_TRACT
  Filled 2023-03-21 (×8): qty 3
  Filled 2023-03-21: qty 6

## 2023-03-21 NOTE — Progress Notes (Signed)
PHARMACY - ANTICOAGULATION CONSULT NOTE  Pharmacy Consult for Heparin Infusion Indication: chest pain/ACS  Allergies  Allergen Reactions   Hydrochlorothiazide Swelling    Facial swelling   Sulfa Antibiotics Swelling    Facial swelling    Patient Measurements: Height: 5\' 5"  (165.1 cm) Weight: (!) 142.8 kg (314 lb 13.1 oz) IBW/kg (Calculated) : 57 Heparin Dosing Weight: 93.1 kg  Vital Signs: Temp: 98.4 F (36.9 C) (10/03 0438) Temp Source: Oral (10/03 0438) BP: 114/70 (10/03 0438) Pulse Rate: 83 (10/03 0438)  Labs: Recent Labs    03/20/23 0558 03/20/23 0822 03/20/23 1415 03/20/23 1415 03/20/23 1635 03/20/23 2238 03/21/23 0559  HGB 13.5  --   --   --   --   --  11.2*  HCT 40.2  --   --   --   --   --  34.8*  PLT 308  --   --   --   --   --  262  APTT  --  25  --   --   --   --   --   LABPROT  --  14.1  --   --   --   --   --   INR  --  1.1  --   --   --   --   --   HEPARINUNFRC  --   --  <0.10*   < > >1.10* 0.26* 0.22*  CREATININE 1.57*  --   --   --   --   --  1.56*  TROPONINIHS 507* 2,906* 10,699*  --   --   --   --    < > = values in this interval not displayed.    Estimated Creatinine Clearance: 55.3 mL/min (A) (by C-G formula based on SCr of 1.56 mg/dL (H)).   Medical History: Past Medical History:  Diagnosis Date   Charcot's joint of foot    Coronary artery disease    Diabetes mellitus with neuropathy (HCC)    Diabetic retinopathy (HCC)    Hypertension    Morbid obesity with BMI of 50.0-59.9, adult (HCC)    Sleep apnea    Stroke [facility])      Assessment: Patient is a 62 year old female with a past medical history of CAD with LAD stenting x 2 in 2023, chronic HFrEF and HFpEF with LVEF 35-40% in 2023, HTN, IDDM with insulin resistance, HLD, morbid obesity, OSA on CPAP at bedtime, CKD stage II, and frequent UTIs who presented with malaise, dysuria, and chills. Pharmacy was consulted to initiate patient on a heparin infusion for ACS/chest pain. Patient  was not on anticoagulation prior to admission, therefore it is appropriate to monitor via heparin levels.  10/2 1415 HL < 0.10 10/02 2238 HL 0.26, subtherapeutic 10/3 0559 HL 0.22   Goal of Therapy:  Heparin level 0.3-0.7 units/ml Monitor platelets by anticoagulation protocol: Yes   Plan:  Heparin level is subtherapeutic. Will give heparin bolus of 2500 units x 1 and increase heparin infusion to 1900 units/hr. Recheck heparin level in 6 hours. CBC daily while on heparin.   Paschal Dopp, PharmD,  03/21/2023 7:57 AM

## 2023-03-21 NOTE — Progress Notes (Signed)
Pt refused bed alarm at this time but was educated about its importance. Willl continue to monitor.

## 2023-03-21 NOTE — Progress Notes (Signed)
PHARMACY - ANTICOAGULATION CONSULT NOTE  Pharmacy Consult for Heparin Infusion Indication: chest pain/ACS  Allergies  Allergen Reactions   Hydrochlorothiazide Swelling    Facial swelling   Sulfa Antibiotics Swelling    Facial swelling    Patient Measurements: Height: 5\' 5"  (165.1 cm) Weight: (!) 142.8 kg (314 lb 13.1 oz) IBW/kg (Calculated) : 57 Heparin Dosing Weight: 93.1 kg  Vital Signs: Temp: 97.8 F (36.6 C) (10/03 1121) Temp Source: Oral (10/03 0438) BP: 145/77 (10/03 1121) Pulse Rate: 78 (10/03 1121)  Labs: Recent Labs    03/20/23 0558 03/20/23 0822 03/20/23 1415 03/20/23 1635 03/20/23 2238 03/21/23 0559 03/21/23 1433  HGB 13.5  --   --   --   --  11.2*  --   HCT 40.2  --   --   --   --  34.8*  --   PLT 308  --   --   --   --  262  --   APTT  --  25  --   --   --   --   --   LABPROT  --  14.1  --   --   --   --   --   INR  --  1.1  --   --   --   --   --   HEPARINUNFRC  --   --  <0.10*   < > 0.26* 0.22* 0.46  CREATININE 1.57*  --   --   --   --  1.56*  --   TROPONINIHS 507* 2,906* 10,699*  --   --  1,610*  --    < > = values in this interval not displayed.    Estimated Creatinine Clearance: 55.3 mL/min (A) (by C-G formula based on SCr of 1.56 mg/dL (H)).   Medical History: Past Medical History:  Diagnosis Date   Charcot's joint of foot    Coronary artery disease    Diabetes mellitus with neuropathy (HCC)    Diabetic retinopathy (HCC)    Hypertension    Morbid obesity with BMI of 50.0-59.9, adult (HCC)    Sleep apnea    Stroke [facility])      Assessment: Patient is a 62 year old female with a past medical history of CAD with LAD stenting x 2 in 2023, chronic HFrEF and HFpEF with LVEF 35-40% in 2023, HTN, IDDM with insulin resistance, HLD, morbid obesity, OSA on CPAP at bedtime, CKD stage II, and frequent UTIs who presented with malaise, dysuria, and chills. Pharmacy was consulted to initiate patient on a heparin infusion for ACS/chest pain. Patient  was not on anticoagulation prior to admission, therefore it is appropriate to monitor via heparin levels.  10/2 1415 HL < 0.10 10/02 2238 HL 0.26, subtherapeutic 10/3 0559 HL 0.22  10/4 1433 HL 0.46.   Goal of Therapy:  Heparin level 0.3-0.7 units/ml Monitor platelets by anticoagulation protocol: Yes   Plan:  Heparin level is therapeutic. Will continue heparin at 1900 units/hr. Recheck heparin level and CBC daily while on heparin.   Paschal Dopp, PharmD, BCPS 03/21/2023 3:59 PM

## 2023-03-21 NOTE — Consult Note (Signed)
NAME: Julia Simmons  DOB: 1960-11-28  MRN: [id number]  Date/Time: 03/21/2023 2:06 PM  REQUESTING PROVIDER: Dr.Zhang Subjective:  REASON FOR CONSULT: E.coli bacteremia ? Julia Simmons is a 62 y.o. with a history of DM, charcot foot Rt, HTN, morbid obesity, OSA, Cad s/p stent presented from home yesterday with rt sided back pain, dysuria of 1 day duration  fever and chills of few hours  and vomiting on the way to the hospital . She has h/o dysuria and on 9/9  urine culture was ecoli and she took a week of ciprofloxacin. In the ED BO initally was low  03/20/23 05:47  BP 76/37 (L)  Temp 98.3 F (36.8 C)  Pulse Rate 126 !  Resp 24 !  SpO2 85 % (L)    Latest Reference Range & Units 03/20/23 05:58  WBC 4.0 - 10.5 K/uL 8.0  Hemoglobin 12.0 - 15.0 g/dL 82.9  HCT 56.2 - 13.0 % 40.2  Platelets 150 - 400 K/uL 308  Creatinine 0.44 - 1.00 mg/dL 8.65 (H)   CXR showed left lung almost white out  Blood culture sent and she was started on IV ceftriaxone and azithromycin CT chest showed left lung multi lobar pneumonia No pE 2 de cho questioned Aortic valve vegetation Blood culture is growing Ecoli and I am asked to see the patient Pt does not have any cough Some sob She has no diarrhea She usually does not do much because of rt foot charcot and has been told not to weight bear No recent tooth pain or extraction No travel No outdoor activities Chronic ulcer rt foot from pressure/neuropathy   Past Medical History:  Diagnosis Date   Charcot's joint of foot    Coronary artery disease    Diabetes mellitus with neuropathy (HCC)    Diabetic retinopathy (HCC)    Hypertension    Morbid obesity with BMI of 50.0-59.9, adult (HCC)    Sleep apnea    Stroke [facility])     Past Surgical History:  Procedure Laterality Date   CATARACT EXTRACTION W/PHACO Right 07/18/2022   Procedure: CATARACT EXTRACTION PHACO AND INTRAOCULAR LENS PLACEMENT (IOC) RIGHT DIABETIC HEALON 5 VISION BLUE 33.79 2:31.3;   Surgeon: Lockie Mola, MD;  [facility];  Service: Ophthalmology;  Laterality: Right;   CESAREAN SECTION      Social History   Socioeconomic History   Marital status: Married    Spouse name: Not on file   Number of children: Not on file   Years of education: Not on file   Highest education level: Not on file  Occupational History   Occupation: disability  Tobacco Use   Smoking status: Never   Smokeless tobacco: Never  Vaping Use   Vaping status: Never Used  Substance and Sexual Activity   Alcohol use: Not Currently   Drug use: Never   Sexual activity: Not on file  Other Topics Concern   Not on file  Social History Narrative   Not on file   Social Determinants of Health   Financial Resource Strain: Low Risk  (02/25/2023)   Received from [facility]   Overall Financial Resource Strain (CARDIA)    Difficulty of Paying Living Expenses: Not hard at all  Food Insecurity: No Food Insecurity (03/20/2023)   Hunger Vital Sign    Worried About Running Out of Food in the Last Year: Never true    Ran Out of Food in the Last Year: Never true  Transportation Needs: No  Transportation Needs (03/20/2023)   PRAPARE - Administrator, Civil Service (Medical): No    Lack of Transportation (Non-Medical): No  Physical Activity: Not on file  Stress: Patient Declined (12/03/2021)   Received from [facility], [facility] of Occupational Health - Occupational Stress Questionnaire    Feeling of Stress : Patient declined  Social Connections: Unknown (12/03/2021)   Received from [facility], [facility]   Social Network    Social Network: Not on file  Intimate Partner Violence: Not At Risk (03/20/2023)   Humiliation, Afraid, Rape, and Kick questionnaire    Fear of Current or Ex-Partner: No    Emotionally Abused: No    Physically Abused: No    Sexually Abused: No    Family History  Problem Relation Age of Onset    Breast cancer Neg Hx    Allergies  Allergen Reactions   Hydrochlorothiazide Swelling    Facial swelling   Sulfa Antibiotics Swelling    Facial swelling   I? Current Facility-Administered Medications  Medication Dose Route Frequency Provider Last Rate Last Admin   acetaminophen (TYLENOL) tablet 650 mg  650 mg Oral Q6H PRN Mikey College T, MD   650 mg at 03/21/23 0039   albuterol (PROVENTIL) (2.5 MG/3ML) 0.083% nebulizer solution 3 mL  3 mL Nebulization Q4H PRN Mikey College T, MD       aspirin EC tablet 81 mg  81 mg Oral Daily Mikey College T, MD   81 mg at 03/21/23 0939   atorvastatin (LIPITOR) tablet 40 mg  40 mg Oral Daily Mikey College T, MD   40 mg at 03/21/23 0936   azithromycin (ZITHROMAX) tablet 500 mg  500 mg Oral Daily Mikey College T, MD   500 mg at 03/21/23 4010   bacitracin ointment   Topical BID Marrion Coy, MD   Given at 03/21/23 1336   budesonide (PULMICORT) nebulizer solution 2 mg  2 mg Nebulization Q12H Mikey College T, MD   2 mg at 03/21/23 0801   cefTRIAXone (ROCEPHIN) 2 g in sodium chloride 0.9 % 100 mL IVPB  2 g Intravenous Q24H Mikey College T, MD 200 mL/hr at 03/21/23 1100 2 g at 03/21/23 1100   clopidogrel (PLAVIX) tablet 75 mg  75 mg Oral Daily Mikey College T, MD   75 mg at 03/21/23 0939   guaiFENesin-dextromethorphan (ROBITUSSIN DM) 100-10 MG/5ML syrup 10 mL  10 mL Oral Q6H PRN Mikey College T, MD       heparin ADULT infusion 100 units/mL (25000 units/22mL)  1,900 Units/hr Intravenous Continuous Ronnald Ramp, RPH 19 mL/hr at 03/21/23 1116 1,900 Units/hr at 03/21/23 1116   insulin aspart (novoLOG) injection 0-20 Units  0-20 Units Subcutaneous TID WC Mikey College T, MD   7 Units at 03/21/23 1337   insulin aspart (novoLOG) injection 0-5 Units  0-5 Units Subcutaneous QHS Mikey College T, MD   2 Units at 03/20/23 2213   insulin glargine-yfgn (SEMGLEE) injection 10 Units  10 Units Subcutaneous Daily Mikey College T, MD   10 Units at 03/21/23 0943   ipratropium-albuterol (DUONEB) 0.5-2.5  (3) MG/3ML nebulizer solution 3 mL  3 mL Nebulization BID Marrion Coy, MD       linagliptin (TRADJENTA) tablet 5 mg  5 mg Oral Daily Mikey College T, MD       metoprolol succinate (TOPROL-XL) 24 hr tablet 25 mg  25 mg Oral BID Hudson, Caralyn, PA-C   25 mg at 03/21/23 [phone]  ondansetron (ZOFRAN) injection 4 mg  4 mg Intravenous Q6H PRN Emeline General, MD       oxybutynin (DITROPAN-XL) 24 hr tablet 10 mg  10 mg Oral Daily Mikey College T, MD   10 mg at 03/21/23 0940     Abtx:  Anti-infectives (From admission, onward)    Start     Dose/Rate Route Frequency Ordered Stop   03/21/23 1000  cefTRIAXone (ROCEPHIN) 2 g in sodium chloride 0.9 % 100 mL IVPB        2 g 200 mL/hr over 30 Minutes Intravenous Every 24 hours 03/20/23 0841 03/25/23 0959   03/21/23 1000  azithromycin (ZITHROMAX) tablet 500 mg        500 mg Oral Daily 03/20/23 0841 03/25/23 0959   03/20/23 0815  azithromycin (ZITHROMAX) 500 mg in sodium chloride 0.9 % 250 mL IVPB        500 mg 250 mL/hr over 60 Minutes Intravenous  Once 03/20/23 0801 03/20/23 0953   03/20/23 0600  cefTRIAXone (ROCEPHIN) 1 g in sodium chloride 0.9 % 100 mL IVPB        1 g 200 mL/hr over 30 Minutes Intravenous  Once 03/20/23 0552 03/20/23 0737       REVIEW OF SYSTEMS:  Const:  fever,  chills, negative weight loss Eyes: negative diplopia or visual changes, negative eye pain ENT: negative coryza, negative sore throat Resp: negative cough, hemoptysis, has dyspnea Cards: negative for chest pain, palpitations, lower extremity edema GU:as above GI: Negative for abdominal pain, diarrhea, bleeding, constipation Skin: negative for rash and pruritus Heme: negative for easy bruising and gum/nose bleeding MS: negative for myalgias, arthralgias, back pain and muscle weakness Neurolo:negative for headaches, dizziness, vertigo, memory problems  Psych: negative for feelings of anxiety, depression  Endocrine:  diabetes Allergy/Immunology- negative for any medication  or food allergies  Objective:  VITALS:  BP (!) 145/77 (BP Location: Right Wrist)   Pulse 78   Temp 97.8 F (36.6 C)   Resp 18   Ht 5\' 5"  (1.651 m)   Wt (!) 142.8 kg   SpO2 96%   BMI 52.39 kg/m   PHYSICAL EXAM:  General: Alert, cooperative, no distress,morbid obesity Nasal oxygen Head: Normocephalic, without obvious abnormality, atraumatic. Eyes: Conjunctivae clear, anicteric sclerae. Pupils are equal ENT Nares normal. No drainage or sinus tenderness. Lips, mucosa, and tongue normal. No Thrush Neck: Supple, symmetrical, no adenopathy, thyroid: non tender no carotid bruit and no JVD. Lungs Decreased air entry Heart: Regular rate and rhythm, no murmur, rub or gallop. Abdomen: Soft, non-tender,not distended. Bowel sounds normal. No masses Extremities: art foot charcot Small superficial ulcer 1st MTP Skin: No rashes or lesions. Or bruising Lymph: Cervical, supraclavicular normal. Neurologic: Grossly non-focal Pertinent Labs Lab Results CBC    Component Value Date/Time   WBC 8.8 03/21/2023 0559   RBC 3.67 (L) 03/21/2023 0559   HGB 11.2 (L) 03/21/2023 0559   HCT 34.8 (L) 03/21/2023 0559   PLT 262 03/21/2023 0559   MCV 94.8 03/21/2023 0559   MCH 30.5 03/21/2023 0559   MCHC 32.2 03/21/2023 0559   RDW 14.0 03/21/2023 0559   LYMPHSABS 0.5 (L) 03/20/2023 0558   MONOABS 0.1 03/20/2023 0558   EOSABS 0.1 03/20/2023 0558   BASOSABS 0.0 03/20/2023 0558       Latest Ref Rng & Units 03/21/2023    5:59 AM 03/20/2023    5:58 AM 08/24/2022   10:00 AM  CMP  Glucose 70 - 99 mg/dL 629  528  114   BUN 6 - 20 mg/dL 40  49  60   Creatinine 0.44 - 1.00 mg/dL 0.10  2.72  5.36   Sodium 135 - 145 mmol/L 137  139  138   Potassium 3.5 - 5.1 mmol/L 3.6  3.6  4.0   Chloride 98 - 111 mmol/L 104  106  105   CO2 22 - 32 mmol/L 22  21  23    Calcium 8.9 - 10.3 mg/dL 8.5  9.2  9.1   Total Protein 6.5 - 8.1 g/dL  7.7    Total Bilirubin 0.3 - 1.2 mg/dL  1.0    Alkaline Phos 38 - 126 U/L  76     AST 15 - 41 U/L  27    ALT 0 - 44 U/L  19        Microbiology: Recent Results (from the past 240 hour(s))  Blood Culture (routine x 2)     Status: None (Preliminary result)   Collection Time: 03/20/23  5:58 AM   Specimen: BLOOD LEFT ARM  Result Value Ref Range Status   Specimen Description BLOOD LEFT ARM  Final   Special Requests   Final    BOTTLES DRAWN AEROBIC AND ANAEROBIC Blood Culture adequate volume   Culture  Setup Time   Final    GRAM NEGATIVE RODS AEROBIC BOTTLE ONLY Organism ID to follow CRITICAL RESULT CALLED TO, READ BACK BY AND VERIFIED WITH: EMILY STEINBOCK 1944 03/20/23 MU Performed at [facility], [address] [place], [place] [address]    Culture GRAM NEGATIVE RODS  Final   Report Status PENDING  Incomplete  Blood Culture ID Panel (Reflexed)     Status: Abnormal   Collection Time: 03/20/23  5:58 AM  Result Value Ref Range Status   Enterococcus faecalis NOT DETECTED NOT DETECTED Final   Enterococcus Faecium NOT DETECTED NOT DETECTED Final   Listeria monocytogenes NOT DETECTED NOT DETECTED Final   Staphylococcus species NOT DETECTED NOT DETECTED Final   Staphylococcus aureus (BCID) NOT DETECTED NOT DETECTED Final   Staphylococcus epidermidis NOT DETECTED NOT DETECTED Final   Staphylococcus lugdunensis NOT DETECTED NOT DETECTED Final   Streptococcus species NOT DETECTED NOT DETECTED Final   Streptococcus agalactiae NOT DETECTED NOT DETECTED Final   Streptococcus pneumoniae NOT DETECTED NOT DETECTED Final   Streptococcus pyogenes NOT DETECTED NOT DETECTED Final   A.calcoaceticus-baumannii NOT DETECTED NOT DETECTED Final   Bacteroides fragilis NOT DETECTED NOT DETECTED Final   Enterobacterales DETECTED (A) NOT DETECTED Final    Comment: Enterobacterales represent a large order of gram negative bacteria, not a single organism. CRITICAL RESULT CALLED TO, READ BACK BY AND VERIFIED WITH: EMILY STEINBOCK 1946 03/20/23 MU    Enterobacter cloacae  complex NOT DETECTED NOT DETECTED Final   Escherichia coli DETECTED (A) NOT DETECTED Final    Comment: CRITICAL RESULT CALLED TO, READ BACK BY AND VERIFIED WITH: EMILY STEINBOCK 1946 03/20/23 MU    Klebsiella aerogenes NOT DETECTED NOT DETECTED Final   Klebsiella oxytoca NOT DETECTED NOT DETECTED Final   Klebsiella pneumoniae NOT DETECTED NOT DETECTED Final   Proteus species NOT DETECTED NOT DETECTED Final   Salmonella species NOT DETECTED NOT DETECTED Final   Serratia marcescens NOT DETECTED NOT DETECTED Final   Haemophilus influenzae NOT DETECTED NOT DETECTED Final   Neisseria meningitidis NOT DETECTED NOT DETECTED Final   Pseudomonas aeruginosa NOT DETECTED NOT DETECTED Final   Stenotrophomonas maltophilia NOT DETECTED NOT DETECTED Final   Candida albicans NOT DETECTED  NOT DETECTED Final   Candida auris NOT DETECTED NOT DETECTED Final   Candida glabrata NOT DETECTED NOT DETECTED Final   Candida krusei NOT DETECTED NOT DETECTED Final   Candida parapsilosis NOT DETECTED NOT DETECTED Final   Candida tropicalis NOT DETECTED NOT DETECTED Final   Cryptococcus neoformans/gattii NOT DETECTED NOT DETECTED Final   CTX-M ESBL NOT DETECTED NOT DETECTED Final   Carbapenem resistance IMP NOT DETECTED NOT DETECTED Final   Carbapenem resistance KPC NOT DETECTED NOT DETECTED Final   Carbapenem resistance NDM NOT DETECTED NOT DETECTED Final   Carbapenem resist OXA 48 LIKE NOT DETECTED NOT DETECTED Final   Carbapenem resistance VIM NOT DETECTED NOT DETECTED Final    Comment: Performed at [facility], [address] [place], [place] [address]  Blood Culture (routine x 2)     Status: None (Preliminary result)   Collection Time: 03/20/23  5:59 AM   Specimen: BLOOD LEFT ARM  Result Value Ref Range Status   Specimen Description BLOOD LEFT ARM  Final   Special Requests   Final    BOTTLES DRAWN AEROBIC AND ANAEROBIC Blood Culture results may not be optimal due to an excessive volume of blood  received in culture bottles   Culture   Final    NO GROWTH 1 DAY Performed at [facility], [address] [place], [place] [address]    Report Status PENDING  Incomplete  Resp panel by RT-PCR (RSV, Flu A&B, Covid) Anterior Nasal Swab     Status: None   Collection Time: 03/20/23  6:03 AM   Specimen: Anterior Nasal Swab  Result Value Ref Range Status   SARS Coronavirus 2 by RT PCR NEGATIVE NEGATIVE Final    Comment: (NOTE) SARS-CoV-2 target nucleic acids are NOT DETECTED.  The SARS-CoV-2 RNA is generally detectable in upper respiratory specimens during the acute phase of infection. The lowest concentration of SARS-CoV-2 viral copies this assay can detect is 138 copies/mL. A negative result does not preclude SARS-Cov-2 infection and should not be used as the sole basis for treatment or other patient management decisions. A negative result may occur with  improper specimen collection/handling, submission of specimen other than nasopharyngeal swab, presence of viral mutation(s) within the areas targeted by this assay, and inadequate number of viral copies(<138 copies/mL). A negative result must be combined with clinical observations, patient history, and epidemiological information. The expected result is Negative.  Fact Sheet for Patients:  BloggerCourse.com  Fact Sheet for Healthcare Providers:  SeriousBroker.it  This test is no t yet approved or cleared by the [place] FDA and  has been authorized for detection and/or diagnosis of SARS-CoV-2 by FDA under an Emergency Use Authorization (EUA). This EUA will remain  in effect (meaning this test can be used) for the duration of the COVID-19 declaration under Section 564(b)(1) of the Act, 21 U.S.C.section 360bbb-3(b)(1), unless the authorization is terminated  or revoked sooner.       Influenza A by PCR NEGATIVE NEGATIVE Final   Influenza B by PCR NEGATIVE NEGATIVE  Final    Comment: (NOTE) The Xpert Xpress SARS-CoV-2/FLU/RSV plus assay is intended as an aid in the diagnosis of influenza from Nasopharyngeal swab specimens and should not be used as a sole basis for treatment. Nasal washings and aspirates are unacceptable for Xpert Xpress SARS-CoV-2/FLU/RSV testing.  Fact Sheet for Patients: BloggerCourse.com  Fact Sheet for Healthcare Providers: SeriousBroker.it  This test is not yet approved or cleared by the [place] FDA and has  been authorized for detection and/or diagnosis of SARS-CoV-2 by FDA under an Emergency Use Authorization (EUA). This EUA will remain in effect (meaning this test can be used) for the duration of the COVID-19 declaration under Section 564(b)(1) of the Act, 21 U.S.C. section 360bbb-3(b)(1), unless the authorization is terminated or revoked.     Resp Syncytial Virus by PCR NEGATIVE NEGATIVE Final    Comment: (NOTE) Fact Sheet for Patients: BloggerCourse.com  Fact Sheet for Healthcare Providers: SeriousBroker.it  This test is not yet approved or cleared by the [place] FDA and has been authorized for detection and/or diagnosis of SARS-CoV-2 by FDA under an Emergency Use Authorization (EUA). This EUA will remain in effect (meaning this test can be used) for the duration of the COVID-19 declaration under Section 564(b)(1) of the Act, 21 U.S.C. section 360bbb-3(b)(1), unless the authorization is terminated or revoked.  Performed at [facility], [address] [place], [place] [address]     IMAGING RESULTS: CXR   I have personally reviewed the films ?extensive air space consolidation left lung CT abdomen /chest Tracheomalacia Rt ureteric enhancement ? neoplasm   Impression/Recommendation  Sepsis  with E coli bacteremia source could be urinary tract  Need post void bladder scan ?Need  to look at the questionable rt ureteric enhancement ? Neoplasm as OP Continue ceftriaxone 2 d echo concern for aortic valve vegetaion- need TEE when stable  Severe left lung pneumonia on imaging - ? CAP ? Due to [facility] or viral or aspiraiton or ARDS Will do resp viral pcr Procal very high Agree with azithromycin and ceftriaxone  Morbid obesity  OSA-on CPAP  DM on insulin  Charcot foot- right with trophic ulcer- very superficial and does not look infected  CAD s/p stent on metoprolol, atorvatstain  Discussed with patient, requesting provider  ? I have personally spent  75---minutes involved in face-to-face and non-face-to-face activities for this patient on the day of the visit. Professional time spent includes the following activities: Preparing to see the patient (review of tests), Obtaining and/or reviewing separately obtained history (admission/discharge record), Performing a medically appropriate examination and/or evaluation , Ordering medications/tests/procedures, referring and communicating with other health care professionals, Documenting clinical information in the EMR, Independently interpreting results (not separately reported), Communicating results to the patient/family/caregiver, Counseling and educating the patient/family/caregiver and Care coordination (not separately reported).    ________________________________________________

## 2023-03-21 NOTE — Consult Note (Signed)
WOC Nurse Consult Note: patient is followed by Dr. Alberteen Spindle podiatrist for R plantar foot wound; has charcot arthropathy, was seen in his office last 03/14/2023 where wound was debrided and order for Bacitracin  Reason for Consult: R medial midfoot wound  Wound type: full thickness neuropathic  Pressure Injury POA: NA  Measurement: 0.5 cm x 0.5 cm x 0.1 cm  Wound bed:75% pink moist 25% yellow fibrin  Drainage (amount, consistency, odor) minimal  serosanguinous  Periwound: callus, charcot foot deformity noted  Dressing procedure/placement/frequency: Cleanse R plantar midfoot wound with NS, apply Bacitracin daily, cover with dry gauze and silicone foam.  May lift foam daily to replace Bacitracin.  Change foam q3 days and prn soiling.   POC discussed with patient and bedside nurse. WOC team will not follow. Re-consult if further needs arise.   Thank you,    Priscella Mann MSN, RN-BC, [employer] [phone]

## 2023-03-21 NOTE — Plan of Care (Signed)
  Problem: Education: Goal: Knowledge of disease or condition will improve Outcome: Progressing   Problem: Respiratory: Goal: Levels of oxygenation will improve Outcome: Progressing   Problem: Clinical Measurements: Goal: Respiratory complications will improve Outcome: Progressing   Problem: Clinical Measurements: Goal: Cardiovascular complication will be avoided Outcome: Progressing   Problem: Pain Managment: Goal: General experience of comfort will improve Outcome: Progressing

## 2023-03-21 NOTE — Inpatient Diabetes Management (Signed)
Inpatient Diabetes Program Recommendations  AACE/ADA: New Consensus Statement on Inpatient Glycemic Control (2015)  Target Ranges:  Prepandial:   less than 140 mg/dL      Peak postprandial:   less than 180 mg/dL (1-2 hours)      Critically ill patients:  140 - 180 mg/dL    Latest Reference Range & Units 03/20/23 13:18 03/20/23 17:17 03/20/23 21:57  Glucose-Capillary 70 - 99 mg/dL 454 (H)  15 units Novolog  10 units Semglee @1409   319 (H)  15 units Novolog  244 (H)  2 units Novolog   (H): Data is abnormally high  Latest Reference Range & Units 03/21/23 09:33  Glucose-Capillary 70 - 99 mg/dL 098 (H)  7 units Novolog  10 units Semglee  (H): Data is abnormally high   Home DM Meds: Novolog 64 units BID       Lantus 100 units at bedtime       Jardiance 25 mg daily       Metformin 1000 mg BID  Current Orders: Semglee 10 units Daily      Novolog Resistant Correction Scale/ SSI (0-20 units) TID AC + HS      Tradjenta 5mg  Daily    MD- Note Insulins started yesterday  CBG 215 this AM  Please consider increasing the Semglee to 15 units Daily  (Please give an extra 5 units Semglee X 1 dose today as well)    --Will follow patient during hospitalization--  Ambrose Finland RN, MSN, CDCES Diabetes Coordinator Inpatient Glycemic Control Team Team Pager: [phone] (8a-5p)

## 2023-03-21 NOTE — Progress Notes (Signed)
[facility] HF Stewardship  PCP: Patrice Paradise, MD  PCP-Cardiologist: None  HPI: Julia Simmons is a 62 y.o. female with hypertension, hyperlipidemia, type 2 diabetes, OSA on CPAP, morbid obesity, CAD s/p DES to LAD 11/2021, ischemic cardiomyopathy, chronic HFrEF (EF 35-40% 11/2021), R MCA CVA 11/2021, who presented with general malaise, dysuria, and chills.  Troponin was 507 on admission, trending up to a peak of 10,699 before trending back down. BNP normal on admission. Lactate of 2.4 on admission. PCT markedly elevated at 24. CTA negative for PE but suggestive of multifocal pneumonia. Bcx showed E. coli bacteremia.   Pertinent cardiac history: TTE in 05/2020 with LVEF of 60-65%, G2DD, normal RV. Cardiac cath at [facility] in 11/2021 showed LAD stenosis treated with DES to the proximal and mid segments requiring Shockwave lithotripsy. Noted ot have 75% stenosis of the RCA at the time. TTE 03/20/23 with LVEF down to 25-30%, G1DD, mild MR, trivial Tr, and small mobile echogenicity noted on TTE.   Pertinent Lab Values: Creatinine, Ser  Date Value Ref Range Status  03/21/2023 1.56 (H) 0.44 - 1.00 mg/dL Final   BUN  Date Value Ref Range Status  03/21/2023 40 (H) 6 - 20 mg/dL Final   Potassium  Date Value Ref Range Status  03/21/2023 3.6 3.5 - 5.1 mmol/L Final   Sodium  Date Value Ref Range Status  03/21/2023 137 135 - 145 mmol/L Final   B Natriuretic Peptide  Date Value Ref Range Status  03/20/2023 59.0 0.0 - 100.0 pg/mL Final    Comment:    Performed at [facility], [address] [place], [place] [address]   Magnesium  Date Value Ref Range Status  06/15/2020 1.8 1.7 - 2.4 mg/dL Final    Comment:    Performed at [facility], [address] [place], [place] [address]   Hgb A1c MFr Bld  Date Value Ref Range Status  03/20/2023 8.3 (H) 4.8 - 5.6 % Final    Comment:    (NOTE) Pre diabetes:          5.7%-6.4%  Diabetes:              >6.4%  Glycemic control  for   <7.0% adults with diabetes     Vital Signs: Temp:  [97.9 F (36.6 C)-98.5 F (36.9 C)] 98.4 F (36.9 C) (10/03 0438) Pulse Rate:  [73-103] 83 (10/03 0438) Cardiac Rhythm: Normal sinus rhythm (10/03 0700) Resp:  [18-29] 20 (10/03 0438) BP: (103-139)/(58-78) 114/70 (10/03 0438) SpO2:  [91 %-99 %] 93 % (10/03 0438) Weight:  [142.8 kg (314 lb 13.1 oz)] 142.8 kg (314 lb 13.1 oz) (10/03 0438)  Intake/Output Summary (Last 24 hours) at 03/21/2023 0800 Last data filed at 03/21/2023 0538 Gross per 24 hour  Intake 17 ml  Output 950 ml  Net -933 ml   Current Inpatient Medications:  -Metoprolol succinate 25 mg BID  Prior to admission HF Medications:  -Metoprolol succinate 50 mg daily -Entresto 24-26 mg BID -Spironolactone 25 mg daily -Jardiance 25 mg daily -Furosemide 40 mg BID  Assessment: 1. 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 NYHA class II-III symptoms.  -Patient denies any shortness of breath. Mild LEE on exam. BNP on admission was normal, creatinine appears at baseline, was euvolemic at home on furosemide 40 mg BID. -BP soft on admission, but now stable in the 130s/70s. Agree with holding medications for now with acute bacteremia. If BP  trends back up, may require restarting home Entresto.  -May not be a good candidate for resuming Jardiance at discharge given multiple UTIs including pyelonephritis. -Will follow plans for cardiac cath.  Plan: 1) Medication changes recommended at this time: -Agree with holding medications until bacteremia improvement/cardiac cath  2) Patient assistance: -None required  3) Education: - Patient has been educated on current HF medications and potential additions to HF medication regimen - Patient verbalizes understanding that over the next few months, these medication doses may change and more medications may be added to optimize HF regimen - Patient has been educated  on basic disease state pathophysiology and goals of therapy  Medication Assistance / Insurance Benefits Check:  Does the patient have prescription insurance?    Type of insurance plan:   Does the patient qualify for medication assistance through manufacturers or grants? No  Outpatient Pharmacy:  Prior to admission outpatient pharmacy: [facility]     Please do not hesitate to reach out with questions or concerns,  Enos Fling, PharmD, BCPS Heart Failure Pharmacist Phone - [phone] 03/21/2023 10:04 AM

## 2023-03-21 NOTE — Progress Notes (Signed)
[facility] CLINIC CARDIOLOGY CONSULT NOTE       Patient ID: Julia Simmons MRN: [id number] DOB/AGE: December 13, 1960 62 y.o.  Admit date: 03/20/2023 Referring Physician Dr. Mikey College Primary Physician Dr. Maurine Minister Primary Cardiologist Dr. Darrold Junker Reason for Consultation NSTEMI  HPI: Julia Simmons is a 62 y.o. female  with a past medical history of coronary artery disease s/p DES to LAD 11/2021, ischemic cardiomyopathy, chronic HFrEF (EF 35-40% 11/2021), R MCA CVA 11/2021, hypertension, hyperlipidemia, type 2 diabetes, OSA on CPAP, morbid obesity who presented to the ED on 03/20/2023 for malaise, dysuria. Cardiology was consulted for further evaluation.   Interval history: -Patient feeling ok this AM. States she did not sleep well. -Overall feels better. SOB improving, remains on supplemental oxygen.  -Echo results reviewed with patient.  -BP and HR remain controlled.    Review of systems complete and found to be negative unless listed above    Past Medical History:  Diagnosis Date   Charcot's joint of foot    Coronary artery disease    Diabetes mellitus with neuropathy (HCC)    Diabetic retinopathy (HCC)    Hypertension    Morbid obesity with BMI of 50.0-59.9, adult (HCC)    Sleep apnea    Stroke [facility])     Past Surgical History:  Procedure Laterality Date   CATARACT EXTRACTION W/PHACO Right 07/18/2022   Procedure: CATARACT EXTRACTION PHACO AND INTRAOCULAR LENS PLACEMENT (IOC) RIGHT DIABETIC HEALON 5 VISION BLUE 33.79 2:31.3;  Surgeon: Lockie Mola, MD;  Location: [facility];  Service: Ophthalmology;  Laterality: Right;   CESAREAN SECTION      Medications Prior to Admission  Medication Sig Dispense Refill Last Dose   albuterol (VENTOLIN HFA) 108 (90 Base) MCG/ACT inhaler Inhale into the lungs.   03/20/2023   aspirin EC 81 MG tablet Take 81 mg by mouth daily.   03/19/2023   atorvastatin (LIPITOR) 40 MG tablet Take by mouth.   03/19/2023   clopidogrel  (PLAVIX) 75 MG tablet Take 75 mg by mouth daily.   03/19/2023   furosemide (LASIX) 40 MG tablet Take 1.5 tablets (60 mg total) by mouth daily. (Patient taking differently: Take 40 mg by mouth 2 (two) times daily.) 30 tablet 1 03/19/2023   guaiFENesin-dextromethorphan (ROBITUSSIN DM) 100-10 MG/5ML syrup Take 10 mLs by mouth every 6 (six) hours as needed for cough. 118 mL 1 prn at unk   Insulin Aspart FlexPen (NOVOLOG) 100 UNIT/ML Inject 64 Units into the skin in the morning and at bedtime. Before lunch and dinner   03/19/2023   insulin glargine (LANTUS) 100 UNIT/ML injection Inject 100 Units into the skin at bedtime.   03/19/2023   JARDIANCE 25 MG TABS tablet Take 25 mg by mouth daily.   03/19/2023   metFORMIN (GLUCOPHAGE) 500 MG tablet Take 1,000 mg by mouth daily with breakfast.   03/19/2023   metFORMIN (GLUCOPHAGE) 500 MG tablet Take 1,000 mg by mouth daily with supper.   03/19/2023   metoprolol succinate (TOPROL-XL) 50 MG 24 hr tablet Take 1 tablet by mouth daily.   03/19/2023   oxybutynin (DITROPAN-XL) 10 MG 24 hr tablet Take 1 tablet by mouth daily.   03/19/2023   sacubitril-valsartan (ENTRESTO) 24-26 MG Take 1 tablet by mouth every 12 (twelve) hours.   03/19/2023   spironolactone (ALDACTONE) 25 MG tablet Take by mouth.   03/19/2023   BD INSULIN SYRINGE U/F 31G X 5/16" 1 ML MISC USE AS DIRECTED ONCE DAILY WITH LEVEMIR (VIAL). (Patient not  [facility] CLINIC CARDIOLOGY CONSULT NOTE       Patient ID: Julia Simmons MRN: [id number] DOB/AGE: December 13, 1960 62 y.o.  Admit date: 03/20/2023 Referring Physician Dr. Mikey College Primary Physician Dr. Maurine Minister Primary Cardiologist Dr. Darrold Junker Reason for Consultation NSTEMI  HPI: Julia Simmons is a 62 y.o. female  with a past medical history of coronary artery disease s/p DES to LAD 11/2021, ischemic cardiomyopathy, chronic HFrEF (EF 35-40% 11/2021), R MCA CVA 11/2021, hypertension, hyperlipidemia, type 2 diabetes, OSA on CPAP, morbid obesity who presented to the ED on 03/20/2023 for malaise, dysuria. Cardiology was consulted for further evaluation.   Interval history: -Patient feeling ok this AM. States she did not sleep well. -Overall feels better. SOB improving, remains on supplemental oxygen.  -Echo results reviewed with patient.  -BP and HR remain controlled.    Review of systems complete and found to be negative unless listed above    Past Medical History:  Diagnosis Date   Charcot's joint of foot    Coronary artery disease    Diabetes mellitus with neuropathy (HCC)    Diabetic retinopathy (HCC)    Hypertension    Morbid obesity with BMI of 50.0-59.9, adult (HCC)    Sleep apnea    Stroke [facility])     Past Surgical History:  Procedure Laterality Date   CATARACT EXTRACTION W/PHACO Right 07/18/2022   Procedure: CATARACT EXTRACTION PHACO AND INTRAOCULAR LENS PLACEMENT (IOC) RIGHT DIABETIC HEALON 5 VISION BLUE 33.79 2:31.3;  Surgeon: Lockie Mola, MD;  Location: [facility];  Service: Ophthalmology;  Laterality: Right;   CESAREAN SECTION      Medications Prior to Admission  Medication Sig Dispense Refill Last Dose   albuterol (VENTOLIN HFA) 108 (90 Base) MCG/ACT inhaler Inhale into the lungs.   03/20/2023   aspirin EC 81 MG tablet Take 81 mg by mouth daily.   03/19/2023   atorvastatin (LIPITOR) 40 MG tablet Take by mouth.   03/19/2023   clopidogrel  (PLAVIX) 75 MG tablet Take 75 mg by mouth daily.   03/19/2023   furosemide (LASIX) 40 MG tablet Take 1.5 tablets (60 mg total) by mouth daily. (Patient taking differently: Take 40 mg by mouth 2 (two) times daily.) 30 tablet 1 03/19/2023   guaiFENesin-dextromethorphan (ROBITUSSIN DM) 100-10 MG/5ML syrup Take 10 mLs by mouth every 6 (six) hours as needed for cough. 118 mL 1 prn at unk   Insulin Aspart FlexPen (NOVOLOG) 100 UNIT/ML Inject 64 Units into the skin in the morning and at bedtime. Before lunch and dinner   03/19/2023   insulin glargine (LANTUS) 100 UNIT/ML injection Inject 100 Units into the skin at bedtime.   03/19/2023   JARDIANCE 25 MG TABS tablet Take 25 mg by mouth daily.   03/19/2023   metFORMIN (GLUCOPHAGE) 500 MG tablet Take 1,000 mg by mouth daily with breakfast.   03/19/2023   metFORMIN (GLUCOPHAGE) 500 MG tablet Take 1,000 mg by mouth daily with supper.   03/19/2023   metoprolol succinate (TOPROL-XL) 50 MG 24 hr tablet Take 1 tablet by mouth daily.   03/19/2023   oxybutynin (DITROPAN-XL) 10 MG 24 hr tablet Take 1 tablet by mouth daily.   03/19/2023   sacubitril-valsartan (ENTRESTO) 24-26 MG Take 1 tablet by mouth every 12 (twelve) hours.   03/19/2023   spironolactone (ALDACTONE) 25 MG tablet Take by mouth.   03/19/2023   BD INSULIN SYRINGE U/F 31G X 5/16" 1 ML MISC USE AS DIRECTED ONCE DAILY WITH LEVEMIR (VIAL). (Patient not  sounds bilaterally. No wheezes, crackles, rhonchi.  Heart: HRRR. Normal S1 and S2 without gallops or murmurs.  Abdomen: Non-distended appearing.  Msk: Normal strength and tone for age. Extremities: Warm and well perfused. No clubbing, cyanosis.  No edema.  Neuro: Alert and oriented X 3. Psych: Answers questions appropriately.   Labs: Basic Metabolic Panel: Recent Labs    03/20/23 0558 03/21/23 0559  NA 139 137  K 3.6 3.6  CL 106 104  CO2 21* 22  GLUCOSE 289* 264*  BUN 49* 40*  CREATININE 1.57* 1.56*  CALCIUM 9.2 8.5*   Liver Function Tests: Recent Labs    03/20/23 0558  AST 27  ALT 19  ALKPHOS 76  BILITOT 1.0  PROT 7.7  ALBUMIN 3.7   No results for input(s): "LIPASE", "AMYLASE" in the last 72 hours. CBC: Recent Labs    03/20/23 0558 03/21/23 0559  WBC 8.0 8.8  NEUTROABS 7.2  --   HGB 13.5 11.2*  HCT 40.2 34.8*  MCV 90.3 94.8  PLT 308 262   Cardiac Enzymes: Recent Labs    03/20/23 0822 03/20/23 1415 03/21/23 0559  TROPONINIHS 2,906* 10,699* 7,673*   BNP: Recent Labs    03/20/23 0558  BNP 59.0   D-Dimer: No results for input(s): "DDIMER" in the last 72 hours. Hemoglobin A1C: Recent Labs    03/20/23 0822  HGBA1C 8.3*   Fasting Lipid Panel: No results for input(s): "CHOL", "HDL", "LDLCALC", "TRIG", "CHOLHDL", "LDLDIRECT" in the last 72 hours. Thyroid Function Tests: No results for  input(s): "TSH", "T4TOTAL", "T3FREE", "THYROIDAB" in the last 72 hours.  Invalid input(s): "FREET3" Anemia Panel: No results for input(s): "VITAMINB12", "FOLATE", "FERRITIN", "TIBC", "IRON", "RETICCTPCT" in the last 72 hours.   Radiology: DG Chest 1 View  Result Date: 03/21/2023 CLINICAL DATA:  62 year old female with history of congestive heart failure. EXAM: CHEST  1 VIEW COMPARISON:  Chest x-ray 03/20/2023. FINDINGS: Vague opacity projecting over the left hemithorax. Right lung appears clear. No right pleural effusion. No pneumothorax. No evidence of pulmonary edema. Heart size is normal. The patient is rotated to the left on today's exam, resulting in distortion of the mediastinal contours and reduced diagnostic sensitivity and specificity for mediastinal pathology. IMPRESSION: 1. Extensive airspace consolidation in the left lung compatible with pneumonia better demonstrated on prior chest CT. Overall, aeration has worsened in the left lung compared to the recent chest x-ray. Electronically Signed   By: Trudie Reed M.D.   On: 03/21/2023 07:22   ECHOCARDIOGRAM COMPLETE  Result Date: 03/20/2023    ECHOCARDIOGRAM REPORT   Patient Name:   Julia Simmons Date of Exam: 03/20/2023 Medical Rec #:  [id number]       Height:       65.0 in Accession #:    [id number]      Weight:       318.0 lb Date of Birth:  1961-03-09        BSA:          2.409 m Patient Age:    62 years        BP:           103/78 mmHg Patient Gender: F               HR:           96 bpm. Exam Location:  [facility] Procedure: 2D Echo, Color Doppler, Cardiac Doppler and Intracardiac            Opacification Agent Indications:  [facility] CLINIC CARDIOLOGY CONSULT NOTE       Patient ID: Julia Simmons MRN: [id number] DOB/AGE: December 13, 1960 62 y.o.  Admit date: 03/20/2023 Referring Physician Dr. Mikey College Primary Physician Dr. Maurine Minister Primary Cardiologist Dr. Darrold Junker Reason for Consultation NSTEMI  HPI: Julia Simmons is a 62 y.o. female  with a past medical history of coronary artery disease s/p DES to LAD 11/2021, ischemic cardiomyopathy, chronic HFrEF (EF 35-40% 11/2021), R MCA CVA 11/2021, hypertension, hyperlipidemia, type 2 diabetes, OSA on CPAP, morbid obesity who presented to the ED on 03/20/2023 for malaise, dysuria. Cardiology was consulted for further evaluation.   Interval history: -Patient feeling ok this AM. States she did not sleep well. -Overall feels better. SOB improving, remains on supplemental oxygen.  -Echo results reviewed with patient.  -BP and HR remain controlled.    Review of systems complete and found to be negative unless listed above    Past Medical History:  Diagnosis Date   Charcot's joint of foot    Coronary artery disease    Diabetes mellitus with neuropathy (HCC)    Diabetic retinopathy (HCC)    Hypertension    Morbid obesity with BMI of 50.0-59.9, adult (HCC)    Sleep apnea    Stroke [facility])     Past Surgical History:  Procedure Laterality Date   CATARACT EXTRACTION W/PHACO Right 07/18/2022   Procedure: CATARACT EXTRACTION PHACO AND INTRAOCULAR LENS PLACEMENT (IOC) RIGHT DIABETIC HEALON 5 VISION BLUE 33.79 2:31.3;  Surgeon: Lockie Mola, MD;  Location: [facility];  Service: Ophthalmology;  Laterality: Right;   CESAREAN SECTION      Medications Prior to Admission  Medication Sig Dispense Refill Last Dose   albuterol (VENTOLIN HFA) 108 (90 Base) MCG/ACT inhaler Inhale into the lungs.   03/20/2023   aspirin EC 81 MG tablet Take 81 mg by mouth daily.   03/19/2023   atorvastatin (LIPITOR) 40 MG tablet Take by mouth.   03/19/2023   clopidogrel  (PLAVIX) 75 MG tablet Take 75 mg by mouth daily.   03/19/2023   furosemide (LASIX) 40 MG tablet Take 1.5 tablets (60 mg total) by mouth daily. (Patient taking differently: Take 40 mg by mouth 2 (two) times daily.) 30 tablet 1 03/19/2023   guaiFENesin-dextromethorphan (ROBITUSSIN DM) 100-10 MG/5ML syrup Take 10 mLs by mouth every 6 (six) hours as needed for cough. 118 mL 1 prn at unk   Insulin Aspart FlexPen (NOVOLOG) 100 UNIT/ML Inject 64 Units into the skin in the morning and at bedtime. Before lunch and dinner   03/19/2023   insulin glargine (LANTUS) 100 UNIT/ML injection Inject 100 Units into the skin at bedtime.   03/19/2023   JARDIANCE 25 MG TABS tablet Take 25 mg by mouth daily.   03/19/2023   metFORMIN (GLUCOPHAGE) 500 MG tablet Take 1,000 mg by mouth daily with breakfast.   03/19/2023   metFORMIN (GLUCOPHAGE) 500 MG tablet Take 1,000 mg by mouth daily with supper.   03/19/2023   metoprolol succinate (TOPROL-XL) 50 MG 24 hr tablet Take 1 tablet by mouth daily.   03/19/2023   oxybutynin (DITROPAN-XL) 10 MG 24 hr tablet Take 1 tablet by mouth daily.   03/19/2023   sacubitril-valsartan (ENTRESTO) 24-26 MG Take 1 tablet by mouth every 12 (twelve) hours.   03/19/2023   spironolactone (ALDACTONE) 25 MG tablet Take by mouth.   03/19/2023   BD INSULIN SYRINGE U/F 31G X 5/16" 1 ML MISC USE AS DIRECTED ONCE DAILY WITH LEVEMIR (VIAL). (Patient not  sounds bilaterally. No wheezes, crackles, rhonchi.  Heart: HRRR. Normal S1 and S2 without gallops or murmurs.  Abdomen: Non-distended appearing.  Msk: Normal strength and tone for age. Extremities: Warm and well perfused. No clubbing, cyanosis.  No edema.  Neuro: Alert and oriented X 3. Psych: Answers questions appropriately.   Labs: Basic Metabolic Panel: Recent Labs    03/20/23 0558 03/21/23 0559  NA 139 137  K 3.6 3.6  CL 106 104  CO2 21* 22  GLUCOSE 289* 264*  BUN 49* 40*  CREATININE 1.57* 1.56*  CALCIUM 9.2 8.5*   Liver Function Tests: Recent Labs    03/20/23 0558  AST 27  ALT 19  ALKPHOS 76  BILITOT 1.0  PROT 7.7  ALBUMIN 3.7   No results for input(s): "LIPASE", "AMYLASE" in the last 72 hours. CBC: Recent Labs    03/20/23 0558 03/21/23 0559  WBC 8.0 8.8  NEUTROABS 7.2  --   HGB 13.5 11.2*  HCT 40.2 34.8*  MCV 90.3 94.8  PLT 308 262   Cardiac Enzymes: Recent Labs    03/20/23 0822 03/20/23 1415 03/21/23 0559  TROPONINIHS 2,906* 10,699* 7,673*   BNP: Recent Labs    03/20/23 0558  BNP 59.0   D-Dimer: No results for input(s): "DDIMER" in the last 72 hours. Hemoglobin A1C: Recent Labs    03/20/23 0822  HGBA1C 8.3*   Fasting Lipid Panel: No results for input(s): "CHOL", "HDL", "LDLCALC", "TRIG", "CHOLHDL", "LDLDIRECT" in the last 72 hours. Thyroid Function Tests: No results for  input(s): "TSH", "T4TOTAL", "T3FREE", "THYROIDAB" in the last 72 hours.  Invalid input(s): "FREET3" Anemia Panel: No results for input(s): "VITAMINB12", "FOLATE", "FERRITIN", "TIBC", "IRON", "RETICCTPCT" in the last 72 hours.   Radiology: DG Chest 1 View  Result Date: 03/21/2023 CLINICAL DATA:  62 year old female with history of congestive heart failure. EXAM: CHEST  1 VIEW COMPARISON:  Chest x-ray 03/20/2023. FINDINGS: Vague opacity projecting over the left hemithorax. Right lung appears clear. No right pleural effusion. No pneumothorax. No evidence of pulmonary edema. Heart size is normal. The patient is rotated to the left on today's exam, resulting in distortion of the mediastinal contours and reduced diagnostic sensitivity and specificity for mediastinal pathology. IMPRESSION: 1. Extensive airspace consolidation in the left lung compatible with pneumonia better demonstrated on prior chest CT. Overall, aeration has worsened in the left lung compared to the recent chest x-ray. Electronically Signed   By: Trudie Reed M.D.   On: 03/21/2023 07:22   ECHOCARDIOGRAM COMPLETE  Result Date: 03/20/2023    ECHOCARDIOGRAM REPORT   Patient Name:   Julia Simmons Date of Exam: 03/20/2023 Medical Rec #:  [id number]       Height:       65.0 in Accession #:    [id number]      Weight:       318.0 lb Date of Birth:  1961-03-09        BSA:          2.409 m Patient Age:    62 years        BP:           103/78 mmHg Patient Gender: F               HR:           96 bpm. Exam Location:  [facility] Procedure: 2D Echo, Color Doppler, Cardiac Doppler and Intracardiac            Opacification Agent Indications:  sounds bilaterally. No wheezes, crackles, rhonchi.  Heart: HRRR. Normal S1 and S2 without gallops or murmurs.  Abdomen: Non-distended appearing.  Msk: Normal strength and tone for age. Extremities: Warm and well perfused. No clubbing, cyanosis.  No edema.  Neuro: Alert and oriented X 3. Psych: Answers questions appropriately.   Labs: Basic Metabolic Panel: Recent Labs    03/20/23 0558 03/21/23 0559  NA 139 137  K 3.6 3.6  CL 106 104  CO2 21* 22  GLUCOSE 289* 264*  BUN 49* 40*  CREATININE 1.57* 1.56*  CALCIUM 9.2 8.5*   Liver Function Tests: Recent Labs    03/20/23 0558  AST 27  ALT 19  ALKPHOS 76  BILITOT 1.0  PROT 7.7  ALBUMIN 3.7   No results for input(s): "LIPASE", "AMYLASE" in the last 72 hours. CBC: Recent Labs    03/20/23 0558 03/21/23 0559  WBC 8.0 8.8  NEUTROABS 7.2  --   HGB 13.5 11.2*  HCT 40.2 34.8*  MCV 90.3 94.8  PLT 308 262   Cardiac Enzymes: Recent Labs    03/20/23 0822 03/20/23 1415 03/21/23 0559  TROPONINIHS 2,906* 10,699* 7,673*   BNP: Recent Labs    03/20/23 0558  BNP 59.0   D-Dimer: No results for input(s): "DDIMER" in the last 72 hours. Hemoglobin A1C: Recent Labs    03/20/23 0822  HGBA1C 8.3*   Fasting Lipid Panel: No results for input(s): "CHOL", "HDL", "LDLCALC", "TRIG", "CHOLHDL", "LDLDIRECT" in the last 72 hours. Thyroid Function Tests: No results for  input(s): "TSH", "T4TOTAL", "T3FREE", "THYROIDAB" in the last 72 hours.  Invalid input(s): "FREET3" Anemia Panel: No results for input(s): "VITAMINB12", "FOLATE", "FERRITIN", "TIBC", "IRON", "RETICCTPCT" in the last 72 hours.   Radiology: DG Chest 1 View  Result Date: 03/21/2023 CLINICAL DATA:  62 year old female with history of congestive heart failure. EXAM: CHEST  1 VIEW COMPARISON:  Chest x-ray 03/20/2023. FINDINGS: Vague opacity projecting over the left hemithorax. Right lung appears clear. No right pleural effusion. No pneumothorax. No evidence of pulmonary edema. Heart size is normal. The patient is rotated to the left on today's exam, resulting in distortion of the mediastinal contours and reduced diagnostic sensitivity and specificity for mediastinal pathology. IMPRESSION: 1. Extensive airspace consolidation in the left lung compatible with pneumonia better demonstrated on prior chest CT. Overall, aeration has worsened in the left lung compared to the recent chest x-ray. Electronically Signed   By: Trudie Reed M.D.   On: 03/21/2023 07:22   ECHOCARDIOGRAM COMPLETE  Result Date: 03/20/2023    ECHOCARDIOGRAM REPORT   Patient Name:   Julia Simmons Date of Exam: 03/20/2023 Medical Rec #:  [id number]       Height:       65.0 in Accession #:    [id number]      Weight:       318.0 lb Date of Birth:  1961-03-09        BSA:          2.409 m Patient Age:    62 years        BP:           103/78 mmHg Patient Gender: F               HR:           96 bpm. Exam Location:  [facility] Procedure: 2D Echo, Color Doppler, Cardiac Doppler and Intracardiac            Opacification Agent Indications:  sounds bilaterally. No wheezes, crackles, rhonchi.  Heart: HRRR. Normal S1 and S2 without gallops or murmurs.  Abdomen: Non-distended appearing.  Msk: Normal strength and tone for age. Extremities: Warm and well perfused. No clubbing, cyanosis.  No edema.  Neuro: Alert and oriented X 3. Psych: Answers questions appropriately.   Labs: Basic Metabolic Panel: Recent Labs    03/20/23 0558 03/21/23 0559  NA 139 137  K 3.6 3.6  CL 106 104  CO2 21* 22  GLUCOSE 289* 264*  BUN 49* 40*  CREATININE 1.57* 1.56*  CALCIUM 9.2 8.5*   Liver Function Tests: Recent Labs    03/20/23 0558  AST 27  ALT 19  ALKPHOS 76  BILITOT 1.0  PROT 7.7  ALBUMIN 3.7   No results for input(s): "LIPASE", "AMYLASE" in the last 72 hours. CBC: Recent Labs    03/20/23 0558 03/21/23 0559  WBC 8.0 8.8  NEUTROABS 7.2  --   HGB 13.5 11.2*  HCT 40.2 34.8*  MCV 90.3 94.8  PLT 308 262   Cardiac Enzymes: Recent Labs    03/20/23 0822 03/20/23 1415 03/21/23 0559  TROPONINIHS 2,906* 10,699* 7,673*   BNP: Recent Labs    03/20/23 0558  BNP 59.0   D-Dimer: No results for input(s): "DDIMER" in the last 72 hours. Hemoglobin A1C: Recent Labs    03/20/23 0822  HGBA1C 8.3*   Fasting Lipid Panel: No results for input(s): "CHOL", "HDL", "LDLCALC", "TRIG", "CHOLHDL", "LDLDIRECT" in the last 72 hours. Thyroid Function Tests: No results for  input(s): "TSH", "T4TOTAL", "T3FREE", "THYROIDAB" in the last 72 hours.  Invalid input(s): "FREET3" Anemia Panel: No results for input(s): "VITAMINB12", "FOLATE", "FERRITIN", "TIBC", "IRON", "RETICCTPCT" in the last 72 hours.   Radiology: DG Chest 1 View  Result Date: 03/21/2023 CLINICAL DATA:  62 year old female with history of congestive heart failure. EXAM: CHEST  1 VIEW COMPARISON:  Chest x-ray 03/20/2023. FINDINGS: Vague opacity projecting over the left hemithorax. Right lung appears clear. No right pleural effusion. No pneumothorax. No evidence of pulmonary edema. Heart size is normal. The patient is rotated to the left on today's exam, resulting in distortion of the mediastinal contours and reduced diagnostic sensitivity and specificity for mediastinal pathology. IMPRESSION: 1. Extensive airspace consolidation in the left lung compatible with pneumonia better demonstrated on prior chest CT. Overall, aeration has worsened in the left lung compared to the recent chest x-ray. Electronically Signed   By: Trudie Reed M.D.   On: 03/21/2023 07:22   ECHOCARDIOGRAM COMPLETE  Result Date: 03/20/2023    ECHOCARDIOGRAM REPORT   Patient Name:   Julia Simmons Date of Exam: 03/20/2023 Medical Rec #:  [id number]       Height:       65.0 in Accession #:    [id number]      Weight:       318.0 lb Date of Birth:  1961-03-09        BSA:          2.409 m Patient Age:    62 years        BP:           103/78 mmHg Patient Gender: F               HR:           96 bpm. Exam Location:  [facility] Procedure: 2D Echo, Color Doppler, Cardiac Doppler and Intracardiac            Opacification Agent Indications:  sounds bilaterally. No wheezes, crackles, rhonchi.  Heart: HRRR. Normal S1 and S2 without gallops or murmurs.  Abdomen: Non-distended appearing.  Msk: Normal strength and tone for age. Extremities: Warm and well perfused. No clubbing, cyanosis.  No edema.  Neuro: Alert and oriented X 3. Psych: Answers questions appropriately.   Labs: Basic Metabolic Panel: Recent Labs    03/20/23 0558 03/21/23 0559  NA 139 137  K 3.6 3.6  CL 106 104  CO2 21* 22  GLUCOSE 289* 264*  BUN 49* 40*  CREATININE 1.57* 1.56*  CALCIUM 9.2 8.5*   Liver Function Tests: Recent Labs    03/20/23 0558  AST 27  ALT 19  ALKPHOS 76  BILITOT 1.0  PROT 7.7  ALBUMIN 3.7   No results for input(s): "LIPASE", "AMYLASE" in the last 72 hours. CBC: Recent Labs    03/20/23 0558 03/21/23 0559  WBC 8.0 8.8  NEUTROABS 7.2  --   HGB 13.5 11.2*  HCT 40.2 34.8*  MCV 90.3 94.8  PLT 308 262   Cardiac Enzymes: Recent Labs    03/20/23 0822 03/20/23 1415 03/21/23 0559  TROPONINIHS 2,906* 10,699* 7,673*   BNP: Recent Labs    03/20/23 0558  BNP 59.0   D-Dimer: No results for input(s): "DDIMER" in the last 72 hours. Hemoglobin A1C: Recent Labs    03/20/23 0822  HGBA1C 8.3*   Fasting Lipid Panel: No results for input(s): "CHOL", "HDL", "LDLCALC", "TRIG", "CHOLHDL", "LDLDIRECT" in the last 72 hours. Thyroid Function Tests: No results for  input(s): "TSH", "T4TOTAL", "T3FREE", "THYROIDAB" in the last 72 hours.  Invalid input(s): "FREET3" Anemia Panel: No results for input(s): "VITAMINB12", "FOLATE", "FERRITIN", "TIBC", "IRON", "RETICCTPCT" in the last 72 hours.   Radiology: DG Chest 1 View  Result Date: 03/21/2023 CLINICAL DATA:  62 year old female with history of congestive heart failure. EXAM: CHEST  1 VIEW COMPARISON:  Chest x-ray 03/20/2023. FINDINGS: Vague opacity projecting over the left hemithorax. Right lung appears clear. No right pleural effusion. No pneumothorax. No evidence of pulmonary edema. Heart size is normal. The patient is rotated to the left on today's exam, resulting in distortion of the mediastinal contours and reduced diagnostic sensitivity and specificity for mediastinal pathology. IMPRESSION: 1. Extensive airspace consolidation in the left lung compatible with pneumonia better demonstrated on prior chest CT. Overall, aeration has worsened in the left lung compared to the recent chest x-ray. Electronically Signed   By: Trudie Reed M.D.   On: 03/21/2023 07:22   ECHOCARDIOGRAM COMPLETE  Result Date: 03/20/2023    ECHOCARDIOGRAM REPORT   Patient Name:   Julia Simmons Date of Exam: 03/20/2023 Medical Rec #:  [id number]       Height:       65.0 in Accession #:    [id number]      Weight:       318.0 lb Date of Birth:  1961-03-09        BSA:          2.409 m Patient Age:    62 years        BP:           103/78 mmHg Patient Gender: F               HR:           96 bpm. Exam Location:  [facility] Procedure: 2D Echo, Color Doppler, Cardiac Doppler and Intracardiac            Opacification Agent Indications:

## 2023-03-21 NOTE — Progress Notes (Addendum)
Progress Note   Patient: TERUKO BOCKOVEN MWU:[id number] DOB: [birth year] DOA: 03/20/2023     1 DOS: the patient was seen and examined on 03/21/2023   Brief hospital course: RHIAN MCCOMMON is a 62 y.o. female with medical history significant of CAD with LAD stenting x 2 in 2023, chronic HFrEF and HFpEF with LVEF 35-40% in 2023, HTN, IDDM with insulin resistance, HLD, morbid obesity, OSA on CPAP at bedtime, CKD stage II, frequent UTIs, presented with malaise, dysuria, chills.  Patient also has significant short of breath, hypoxemia of 85%, she was hypotensive at time of admission, blood pressure 76/37, oxygen saturation 85% on room air.  Patient received IV fluid being bolus in the emergency room.   Chest CT scan showed a large pneumonia involving the entire left lung. Blood culture also positive for E. coli. Patient is treated with Rocephin and Zithromax.    Principal Problem:   Severe sepsis (HCC) Active Problems:   PNA (pneumonia)   Diabetes mellitus with neuropathy (HCC)   Hypertension   Morbid obesity with BMI of 50.0-59.9, adult (HCC)   Sepsis secondary to UTI [facility])   CAD S/P percutaneous coronary angioplasty   NSTEMI (non-ST elevated myocardial infarction) (HCC)   E. coli septicemia (HCC)   Assessment and Plan: Acute respiratory failure with hypoxemia. Left lung pneumonia. Personally reviewed patient chest CT head scan images, patient has extensive pneumonia involving the entire left lung.  This appears to be bacterial, procalcitonin level significantly increased.  Patient developed significant respiratory distress and hypoxemia. Patient does not have significant dysphagia, she vomited the day prior, but the severity of pneumonia indicates that this is probably not a aspiration pneumonia. Appreciate pulmonology consult, continue antibiotics with Rocephin.  Severe sepsis secondary to pneumonia. E. coli septicemia. Possible vegetation in aortic valve. Dysuria with possible UTI  and pyelonephritis. Patient met sepsis criteria with significant tachycardia and tachypnea, additionally, patient has elevated lactic acid, elevated troponin, significant hypotension. Patient blood culture was positive for E. coli, echocardiogram showed possible small vegetation in the aortic valve.  Cardiology is seeing the patient, may need a TEE. Source of bacteremia could be from urinary, CT abdomen/pelvis showed evidence of pyelonephritis.  Urine culture still pending. Continue high-dose Rocephin. Obtain consult from ID.  Non-STEMI. Chronic systolic congestive heart failure. Patient troponin is more than 10,000, cardiology to see the patient, considering workup when condition is more stable.  Currently on IV heparin infusion. Currently, patient is euvolemic.  Uncontrolled type 2 diabetes with hyperglycemia. Glucose running high from stress, continue sliding scale insulin and scheduled insulin.  Chronic kidney disease stage IIIb. Patient renal function appears to be stable at this time.  Morbid obesity. Obstructive sleep apnea. CPAP while asleep.     Subjective:  Patient still has some short of breath today, but much improved compared to yesterday.  Cough, nonproductive. No fever or chills.  No diarrhea.  Physical Exam: Vitals:   03/21/23 0438 03/21/23 0801 03/21/23 0942 03/21/23 1121  BP: 114/70  131/76 (!) 145/77  Pulse: 83  88 78  Resp: 20  18 18   Temp: 98.4 F (36.9 C)  98.5 F (36.9 C) 97.8 F (36.6 C)  TempSrc: Oral     SpO2: 93% 98% 97% 96%  Weight: (!) 142.8 kg     Height: 5\' 5"  (1.651 m)      General exam: Appears calm and comfortable, morbidly obese. Respiratory system: Left lung crackles. Respiratory effort normal. Cardiovascular system: S1 & S2 heard, RRR. No JVD,  murmurs, rubs, gallops or clicks. No pedal edema. Gastrointestinal system: Abdomen is nondistended, soft and nontender. No organomegaly or masses felt. Normal bowel sounds heard. Central  nervous system: Alert and oriented. No focal neurological deficits. Extremities: Symmetric 5 x 5 power. Skin: No rashes, lesions or ulcers Psychiatry: Judgement and insight appear normal. Mood & affect appropriate.    Data Reviewed:  Reviewed CT scan images, results, lab results.  Family Communication: None  Disposition: Status is: Inpatient Remains inpatient appropriate because: Severity of disease, IV treatment. High risk of deterioration.     Time spent: 55 minutes  Author: Marrion Coy, MD 03/21/2023 1:23 PM  For on call review [email or web address].

## 2023-03-21 NOTE — Consult Note (Signed)
PULMONOLOGY         Date: 03/21/2023,   MRN# [id number] Julia Simmons 11-02-1960     AdmissionWeight: (!) 144.2 kg                 CurrentWeight: (!) 142.8 kg  Referring provider: Dr Chipper Herb   CHIEF COMPLAINT:   Multifocal pneumonia with E.coli bacteremia   HISTORY OF PRESENT ILLNESS   This is 62 year old morbidly obese female with a BMI of over 50 who has diabetes with chronic neuropathy, retinopathy and Charcot's foot, severe coronary artery disease status post 2 stents and reports having previous CVA and congestive heart failure, also background history of sleep apnea who came in with shortness of breath cough and chills x 2 days.  She was found to have multifocal pneumonia worse on the left on CT chest imaging with PE protocol.  She did have microbiology including blood culture showing E. coli bacteremia.  Patient is on room air during my evaluation.  She does report acid reflux but denies having aspiration events and denies vomiting.   PAST MEDICAL HISTORY   Past Medical History:  Diagnosis Date   Charcot's joint of foot    Coronary artery disease    Diabetes mellitus with neuropathy (HCC)    Diabetic retinopathy (HCC)    Hypertension    Morbid obesity with BMI of 50.0-59.9, adult (HCC)    Sleep apnea    Stroke [facility])      SURGICAL HISTORY   Past Surgical History:  Procedure Laterality Date   CATARACT EXTRACTION W/PHACO Right 07/18/2022   Procedure: CATARACT EXTRACTION PHACO AND INTRAOCULAR LENS PLACEMENT (IOC) RIGHT DIABETIC HEALON 5 VISION BLUE 33.79 2:31.3;  Surgeon: Lockie Mola, MD;  Location: [facility];  Service: Ophthalmology;  Laterality: Right;   CESAREAN SECTION       FAMILY HISTORY   Family History  Problem Relation Age of Onset   Breast cancer Neg Hx      SOCIAL HISTORY   Social History   Tobacco Use   Smoking status: Never   Smokeless tobacco: Never  Vaping Use   Vaping status: Never Used  Substance Use  Topics   Alcohol use: Not Currently   Drug use: Never     MEDICATIONS    Home Medication:    Current Medication:  Current Facility-Administered Medications:    acetaminophen (TYLENOL) tablet 650 mg, 650 mg, Oral, Q6H PRN, Mikey College T, MD, 650 mg at 03/21/23 0039   albuterol (PROVENTIL) (2.5 MG/3ML) 0.083% nebulizer solution 3 mL, 3 mL, Nebulization, Q4H PRN, Mikey College T, MD   aspirin EC tablet 81 mg, 81 mg, Oral, Daily, Chipper Herb, Ping T, MD, 81 mg at 03/20/23 1407   atorvastatin (LIPITOR) tablet 40 mg, 40 mg, Oral, Daily, Chipper Herb, Ping T, MD, 40 mg at 03/20/23 1407   azithromycin (ZITHROMAX) tablet 500 mg, 500 mg, Oral, Daily, Zhang, Ping T, MD   budesonide (PULMICORT) nebulizer solution 2 mg, 2 mg, Nebulization, Q12H, Chipper Herb, Ping T, MD, 2 mg at 03/21/23 0801   cefTRIAXone (ROCEPHIN) 2 g in sodium chloride 0.9 % 100 mL IVPB, 2 g, Intravenous, Q24H, Zhang, Ping T, MD   clopidogrel (PLAVIX) tablet 75 mg, 75 mg, Oral, Daily, Chipper Herb, Ping T, MD, 75 mg at 03/20/23 1407   guaiFENesin-dextromethorphan (ROBITUSSIN DM) 100-10 MG/5ML syrup 10 mL, 10 mL, Oral, Q6H PRN, Mikey College T, MD   heparin ADULT infusion 100 units/mL (25000 units/237mL), 1,900 Units/hr, Intravenous, Continuous, Patel, Kishan S,  RPH, Last Rate: 17 mL/hr at 03/21/23 0039, 1,700 Units/hr at 03/21/23 0039   heparin bolus via infusion 2,500 Units, 2,500 Units, Intravenous, Once, Ronnald Ramp, RPH   insulin aspart (novoLOG) injection 0-20 Units, 0-20 Units, Subcutaneous, TID WC, Emeline General, MD, 15 Units at 03/20/23 1750   insulin aspart (novoLOG) injection 0-5 Units, 0-5 Units, Subcutaneous, QHS, Mikey College T, MD, 2 Units at 03/20/23 2213   insulin glargine-yfgn (SEMGLEE) injection 10 Units, 10 Units, Subcutaneous, Daily, Mikey College T, MD, 10 Units at 03/20/23 1409   ipratropium-albuterol (DUONEB) 0.5-2.5 (3) MG/3ML nebulizer solution 3 mL, 3 mL, Nebulization, BID, Marrion Coy, MD   linagliptin (TRADJENTA) tablet 5 mg, 5  mg, Oral, Daily, Mikey College T, MD   metoprolol succinate (TOPROL-XL) 24 hr tablet 25 mg, 25 mg, Oral, Daily, Hudson, Caralyn, PA-C   ondansetron (ZOFRAN) injection 4 mg, 4 mg, Intravenous, Q6H PRN, Emeline General, MD   oxybutynin (DITROPAN-XL) 24 hr tablet 10 mg, 10 mg, Oral, Daily, Chipper Herb, Ping T, MD, 10 mg at 03/20/23 1407    ALLERGIES   Hydrochlorothiazide and Sulfa antibiotics     REVIEW OF SYSTEMS    Review of Systems:  Gen:  Denies  fever, sweats, chills weigh loss  HEENT: Denies blurred vision, double vision, ear pain, eye pain, hearing loss, nose bleeds, sore throat Cardiac:  No dizziness, chest pain or heaviness, chest tightness,edema Resp:   reports dyspnea chronically  Gi: Denies swallowing difficulty, stomach pain, nausea or vomiting, diarrhea, constipation, bowel incontinence Gu:  Denies bladder incontinence, burning urine Ext:   Denies Joint pain, stiffness or swelling Skin: Denies  skin rash, easy bruising or bleeding or hives Endoc:  Denies polyuria, polydipsia , polyphagia or weight change Psych:   Denies depression, insomnia or hallucinations   Other:  All other systems negative   VS: BP 114/70 (BP Location: Right Arm)   Pulse 83   Temp 98.4 F (36.9 C) (Oral)   Resp 20   Ht 5\' 5"  (1.651 m)   Wt (!) 142.8 kg   SpO2 98%   BMI 52.39 kg/m      PHYSICAL EXAM    GENERAL:NAD, no fevers, chills, no weakness no fatigue HEAD: Normocephalic, atraumatic.  EYES: Pupils equal, round, reactive to light. Extraocular muscles intact. No scleral icterus.  MOUTH: Moist mucosal membrane. Dentition intact. No abscess noted.  EAR, NOSE, THROAT: Clear without exudates. No external lesions.  NECK: Supple. No thyromegaly. No nodules. No JVD.  PULMONARY: decreased breath sounds with mild rhonchi worse at bases bilaterally.  CARDIOVASCULAR: S1 and S2. Regular rate and rhythm. No murmurs, rubs, or gallops. No edema. Pedal pulses 2+ bilaterally.  GASTROINTESTINAL: Soft,  nontender, nondistended. No masses. Positive bowel sounds. No hepatosplenomegaly.  MUSCULOSKELETAL: No swelling, clubbing, or edema. Range of motion full in all extremities.  NEUROLOGIC: Cranial nerves II through XII are intact. No gross focal neurological deficits. Sensation intact. Reflexes intact.  SKIN: No ulceration, lesions, rashes, or cyanosis. Skin warm and dry. Turgor intact.  PSYCHIATRIC: Mood, affect within normal limits. The patient is awake, alert and oriented x 3. Insight, judgment intact.       IMAGING   \  ASSESSMENT/PLAN   Multifocal pneumonia -suspecti due to e.coli - present on admission  - COVID19 negative  - supplemental O2 during my evaluation room air - will perform infectious workup for pneumonia -Respiratory viral panel -serum fungitell -legionella ab -strep pneumoniae ur AG -Histoplasma Ur Ag -sputum resp cultures -reviewed pertinent imaging  with patient today -PT/OT for d/c planning  -please encourage patient to use incentive spirometer few times each hour while hospitalized.       Ecoli bacteremia   - patient without shock physiology     - consider ID consultation     - continue antibiotics , currently on zithromax and rocephin     OSA    Continue home CPAP with settings as per RT   - orders placed    Thank you for allowing me to participate in the care of this patient.   Patient/Family are satisfied with care plan and all questions have been answered.    Provider disclosure: Patient with at least one acute or chronic illness or injury that poses a threat to life or bodily function and is being managed actively during this encounter.  All of the below services have been performed independently by signing provider:  review of prior documentation from internal and or external health records.  Review of previous and current lab results.  Interview and comprehensive assessment during patient visit today. Review of current and previous chest  radiographs/CT scans. Discussion of management and test interpretation with health care team and patient/family.   This document was prepared using Dragon voice recognition software and may include unintentional dictation errors.     Vida Rigger, M.D.  Division of Pulmonary & Critical Care Medicine

## 2023-03-21 NOTE — Hospital Course (Addendum)
Julia Simmons is a 62 y.o. female with medical history significant of CAD with LAD stenting x 2 in 2023, chronic HFrEF and HFpEF with LVEF 35-40% in 2023, HTN, IDDM with insulin resistance, HLD, morbid obesity, OSA on CPAP at bedtime, CKD stage II, frequent UTIs, presented with malaise, dysuria, chills.  Patient also has significant short of breath, hypoxemia of 85%, she was hypotensive at time of admission, blood pressure 76/37, oxygen saturation 85% on room air.  Patient received IV fluid being bolus in the emergency room.   Chest CT scan showed a large pneumonia involving the entire left lung. Blood culture also positive for E. coli. Patient is treated with Rocephin and Zithromax. Patient so far has completed Zithromax, left lung pneumonia essentially resolved, consistent with aspiration. TEE was performed on 10/7, did not see any vegetation.  Decision is made patient will be treated with antibiotics for 10 to 14 days. Patient also had a significant elevation of troponin more than 10,000, coronary angiogram performed on 10/8.

## 2023-03-22 ENCOUNTER — Inpatient Hospital Stay: Payer: PPO

## 2023-03-22 DIAGNOSIS — A419 Sepsis, unspecified organism: Secondary | ICD-10-CM | POA: Diagnosis not present

## 2023-03-22 DIAGNOSIS — J9601 Acute respiratory failure with hypoxia: Secondary | ICD-10-CM

## 2023-03-22 DIAGNOSIS — R7881 Bacteremia: Secondary | ICD-10-CM | POA: Insufficient documentation

## 2023-03-22 DIAGNOSIS — J189 Pneumonia, unspecified organism: Secondary | ICD-10-CM | POA: Diagnosis not present

## 2023-03-22 DIAGNOSIS — A4151 Sepsis due to Escherichia coli [E. coli]: Secondary | ICD-10-CM | POA: Diagnosis not present

## 2023-03-22 DIAGNOSIS — R918 Other nonspecific abnormal finding of lung field: Secondary | ICD-10-CM

## 2023-03-22 LAB — BASIC METABOLIC PANEL
Anion gap: 9 (ref 5–15)
BUN: 29 mg/dL — ABNORMAL HIGH (ref 6–20)
CO2: 24 mmol/L (ref 22–32)
Calcium: 8.4 mg/dL — ABNORMAL LOW (ref 8.9–10.3)
Chloride: 106 mmol/L (ref 98–111)
Creatinine, Ser: 1.25 mg/dL — ABNORMAL HIGH (ref 0.44–1.00)
GFR, Estimated: 49 mL/min — ABNORMAL LOW (ref >60)
Glucose, Bld: 187 mg/dL — ABNORMAL HIGH (ref 70–99)
Potassium: 3.9 mmol/L (ref 3.5–5.1)
Sodium: 139 mmol/L (ref 135–145)

## 2023-03-22 LAB — CBC
HCT: 32.5 % — ABNORMAL LOW (ref 36.0–46.0)
Hemoglobin: 10.7 g/dL — ABNORMAL LOW (ref 12.0–15.0)
MCH: 30.5 pg (ref 26.0–34.0)
MCHC: 32.9 g/dL (ref 30.0–36.0)
MCV: 92.6 fL (ref 80.0–100.0)
Platelets: 257 10*3/uL (ref 150–400)
RBC: 3.51 MIL/uL — ABNORMAL LOW (ref 3.87–5.11)
RDW: 14.2 % (ref 11.5–15.5)
WBC: 8.3 10*3/uL (ref 4.0–10.5)
nRBC: 0 % (ref 0.0–0.2)

## 2023-03-22 LAB — RESPIRATORY PANEL BY PCR

## 2023-03-22 LAB — LEGIONELLA PNEUMOPHILA SEROGP 1 UR AG: L. pneumophila Serogp 1 Ur Ag: NEGATIVE

## 2023-03-22 LAB — HEPARIN LEVEL (UNFRACTIONATED): Heparin Unfractionated: 0.4 [IU]/mL (ref 0.30–0.70)

## 2023-03-22 LAB — URINE CULTURE: Culture: >=100000 — AB

## 2023-03-22 LAB — GLUCOSE, CAPILLARY
Glucose-Capillary: 163 mg/dL — ABNORMAL HIGH (ref 70–99)
Glucose-Capillary: 238 mg/dL — ABNORMAL HIGH (ref 70–99)
Glucose-Capillary: 309 mg/dL — ABNORMAL HIGH (ref 70–99)
Glucose-Capillary: 332 mg/dL — ABNORMAL HIGH (ref 70–99)

## 2023-03-22 MED ORDER — FUROSEMIDE 40 MG PO TABS
40.0000 mg | ORAL_TABLET | Freq: Two times a day (BID) | ORAL | Status: DC
  Administered 2023-03-22 – 2023-03-26 (×8): 40 mg via ORAL
  Filled 2023-03-22 (×8): qty 1

## 2023-03-22 NOTE — Progress Notes (Signed)
Date of Admission:  03/20/2023     ID: Julia Simmons is a 62 y.o. female Principal Problem:   Severe sepsis (HCC) Active Problems:   PNA (pneumonia)   Diabetes mellitus with neuropathy (HCC)   Hypertension   Morbid obesity with BMI of 50.0-59.9, adult (HCC)   Sepsis secondary to UTI [facility])   CAD S/P percutaneous coronary angioplasty   NSTEMI (non-ST elevated myocardial infarction) (HCC)   E. coli septicemia (HCC)   Chronic systolic CHF (congestive heart failure) (HCC)   Acute hypoxemic respiratory failure (HCC)    Subjective: Pt feeling better Ambulating in room with Mobility specialits  Medications:   aspirin EC  81 mg Oral Daily   atorvastatin  40 mg Oral Daily   azithromycin  500 mg Oral Daily   bacitracin   Topical BID   budesonide (PULMICORT) nebulizer solution  2 mg Nebulization Q12H   clopidogrel  75 mg Oral Daily   furosemide  40 mg Oral BID   insulin aspart  0-20 Units Subcutaneous TID WC   insulin aspart  0-5 Units Subcutaneous QHS   insulin glargine-yfgn  10 Units Subcutaneous Daily   ipratropium-albuterol  3 mL Nebulization BID   linagliptin  5 mg Oral Daily   metoprolol succinate  25 mg Oral BID   oxybutynin  10 mg Oral Daily    Objective: Vital signs in last 24 hours: Patient Vitals for the past 24 hrs:  BP Temp Temp src Pulse Resp SpO2 Weight  03/22/23 0900 128/66 99.6 F (37.6 C) Oral 85 (!) 28 90 % --  03/22/23 0740 -- -- -- -- -- 99 % --  03/22/23 0342 108/63 -- -- 83 18 91 % (!) 144.3 kg  03/21/23 2348 118/68 98.4 F (36.9 C) -- 87 20 90 % --  03/21/23 2218 -- -- -- -- -- 98 % --  03/21/23 2105 -- -- -- -- -- 96 % --  03/21/23 1921 118/61 99.3 F (37.4 C) -- 78 18 96 % --  03/21/23 1732 139/82 97.7 F (36.5 C) -- 90 18 98 % --     PHYSICAL EXAM:  General: Alert, cooperative, no distress, appears stated age.  Lungs: b/l air entry- decreased left side Heart: Regular rate and rhythm, no murmur, rub or gallop. Abdomen: Soft,  non-tender,not distended. Bowel sounds normal. No masses Extremities: atraumatic, no cyanosis. No edema. No clubbing Skin: No rashes or lesions. Or bruising Lymph: Cervical, supraclavicular normal. Neurologic: Grossly non-focal  Lab Results    Latest Ref Rng & Units 03/22/2023    3:05 AM 03/21/2023    5:59 AM 03/20/2023    5:58 AM  CBC  WBC 4.0 - 10.5 K/uL 8.3  8.8  8.0   Hemoglobin 12.0 - 15.0 g/dL 16.1  09.6  04.5   Hematocrit 36.0 - 46.0 % 32.5  34.8  40.2   Platelets 150 - 400 K/uL 257  262  308        Latest Ref Rng & Units 03/22/2023    3:05 AM 03/21/2023    5:59 AM 03/20/2023    5:58 AM  CMP  Glucose 70 - 99 mg/dL 409  811  914   BUN 6 - 20 mg/dL 29  40  49   Creatinine 0.44 - 1.00 mg/dL 7.82  9.56  2.13   Sodium 135 - 145 mmol/L 139  137  139   Potassium 3.5 - 5.1 mmol/L 3.9  3.6  3.6   Chloride 98 - 111  mmol/L 106  104  106   CO2 22 - 32 mmol/L 24  22  21    Calcium 8.9 - 10.3 mg/dL 8.4  8.5  9.2   Total Protein 6.5 - 8.1 g/dL   7.7   Total Bilirubin 0.3 - 1.2 mg/dL   1.0   Alkaline Phos 38 - 126 U/L   76   AST 15 - 41 U/L   27   ALT 0 - 44 U/L   19       Microbiology: [facility]- Ecoli  Studies/Results: DG Chest 2 View  Result Date: 03/22/2023 CLINICAL DATA:  Pneumonia EXAM: CHEST - 2 VIEW COMPARISON:  03/21/2023 FINDINGS: Cardiomegaly. Mild, diffuse interstitial opacity similar to prior examination. No focal airspace opacity. The visualized skeletal structures are unremarkable. IMPRESSION: Cardiomegaly with mild, diffuse interstitial opacity similar to prior examination, consistent with edema or atypical/viral infection. No focal airspace opacity. Electronically Signed   By: Jearld Lesch M.D.   On: 03/22/2023 12:47   DG Chest 1 View  Result Date: 03/21/2023 CLINICAL DATA:  62 year old female with history of congestive heart failure. EXAM: CHEST  1 VIEW COMPARISON:  Chest x-ray 03/20/2023. FINDINGS: Vague opacity projecting over the left hemithorax. Right lung appears  clear. No right pleural effusion. No pneumothorax. No evidence of pulmonary edema. Heart size is normal. The patient is rotated to the left on today's exam, resulting in distortion of the mediastinal contours and reduced diagnostic sensitivity and specificity for mediastinal pathology. IMPRESSION: 1. Extensive airspace consolidation in the left lung compatible with pneumonia better demonstrated on prior chest CT. Overall, aeration has worsened in the left lung compared to the recent chest x-ray. Electronically Signed   By: Trudie Reed M.D.   On: 03/21/2023 07:22     Assessment/Plan:   Sepsis  with E coli bacteremia source could be urinary tract   Need post void bladder scan ?Need to look at the questionable rt ureteric enhancement ? Neoplasm as OP Continue ceftriaxone 2 d echo concern for aortic valve vegetaion- need TEE when stable   Severe left lung pneumonia on imaging - ? CAP ? Due to [facility] or viral or aspiraiton or ARDS Will do resp viral pcr Procal very high Agree with azithromycin and ceftriaxone   Morbid obesity   OSA-on CPAP   DM on insulin  Charcot foot- right with trophic ulcer- very superficial and does not look infected   CAD s/p stent on metoprolol, atorvatstain   Discussed with patient, and Dr.Zhang   On call team ID team available by phone this weekend for urgent issues

## 2023-03-22 NOTE — Progress Notes (Signed)
Msk: Normal strength and tone for age. Extremities: Warm and well perfused. No clubbing, cyanosis.  No edema.  Neuro: Alert and oriented X 3. Psych: Answers questions appropriately.   Labs: Basic Metabolic Panel: Recent Labs    03/21/23 0559 03/22/23 0305  NA 137 139  K 3.6 3.9  CL 104 106  CO2 22 24  GLUCOSE 264* 187*  BUN 40* 29*  CREATININE 1.56* 1.25*  CALCIUM 8.5* 8.4*   Liver Function Tests: Recent Labs    03/20/23 0558  AST 27  ALT 19  ALKPHOS 76  BILITOT 1.0  PROT 7.7  ALBUMIN 3.7   No results for input(s): "LIPASE", "AMYLASE" in the last 72 hours. CBC: Recent Labs    03/20/23 0558 03/21/23 0559 03/22/23 0305  WBC 8.0 8.8 8.3  NEUTROABS 7.2  --   --   HGB 13.5 11.2* 10.7*  HCT 40.2 34.8* 32.5*  MCV 90.3 94.8 92.6  PLT 308 262 257   Cardiac Enzymes: Recent Labs    03/20/23 0822 03/20/23 1415 03/21/23 0559  TROPONINIHS 2,906* 10,699* 7,673*   BNP: Recent Labs    03/20/23 0558  BNP 59.0   D-Dimer: No results for input(s): "DDIMER" in the last 72 hours. Hemoglobin A1C: Recent Labs    03/20/23 0822  HGBA1C 8.3*   Fasting Lipid Panel: No results for input(s): "CHOL", "HDL", "LDLCALC", "TRIG", "CHOLHDL", "LDLDIRECT" in the last 72 hours. Thyroid Function Tests: No results for input(s): "TSH", "T4TOTAL", "T3FREE",  "THYROIDAB" in the last 72 hours.  Invalid input(s): "FREET3" Anemia Panel: No results for input(s): "VITAMINB12", "FOLATE", "FERRITIN", "TIBC", "IRON", "RETICCTPCT" in the last 72 hours.   Radiology: DG Chest 1 View  Result Date: 03/21/2023 CLINICAL DATA:  62 year old female with history of congestive heart failure. EXAM: CHEST  1 VIEW COMPARISON:  Chest x-ray 03/20/2023. FINDINGS: Vague opacity projecting over the left hemithorax. Right lung appears clear. No right pleural effusion. No pneumothorax. No evidence of pulmonary edema. Heart size is normal. The patient is rotated to the left on today's exam, resulting in distortion of the mediastinal contours and reduced diagnostic sensitivity and specificity for mediastinal pathology. IMPRESSION: 1. Extensive airspace consolidation in the left lung compatible with pneumonia better demonstrated on prior chest CT. Overall, aeration has worsened in the left lung compared to the recent chest x-ray. Electronically Signed   By: Trudie Reed M.D.   On: 03/21/2023 07:22   ECHOCARDIOGRAM COMPLETE  Result Date: 03/20/2023    ECHOCARDIOGRAM REPORT   Patient Name:   Julia Simmons Date of Exam: 03/20/2023 Medical Rec #:  [id number]       Height:       65.0 in Accession #:    [id number]      Weight:       318.0 lb Date of Birth:  [birth year]        BSA:          2.409 m Patient Age:    62 years        BP:           103/78 mmHg Patient Gender: F               HR:           96 bpm. Exam Location:  [facility] Procedure: 2D Echo, Color Doppler, Cardiac Doppler and Intracardiac            Opacification Agent Indications:     NSTEMI I21.4  History:  Msk: Normal strength and tone for age. Extremities: Warm and well perfused. No clubbing, cyanosis.  No edema.  Neuro: Alert and oriented X 3. Psych: Answers questions appropriately.   Labs: Basic Metabolic Panel: Recent Labs    03/21/23 0559 03/22/23 0305  NA 137 139  K 3.6 3.9  CL 104 106  CO2 22 24  GLUCOSE 264* 187*  BUN 40* 29*  CREATININE 1.56* 1.25*  CALCIUM 8.5* 8.4*   Liver Function Tests: Recent Labs    03/20/23 0558  AST 27  ALT 19  ALKPHOS 76  BILITOT 1.0  PROT 7.7  ALBUMIN 3.7   No results for input(s): "LIPASE", "AMYLASE" in the last 72 hours. CBC: Recent Labs    03/20/23 0558 03/21/23 0559 03/22/23 0305  WBC 8.0 8.8 8.3  NEUTROABS 7.2  --   --   HGB 13.5 11.2* 10.7*  HCT 40.2 34.8* 32.5*  MCV 90.3 94.8 92.6  PLT 308 262 257   Cardiac Enzymes: Recent Labs    03/20/23 0822 03/20/23 1415 03/21/23 0559  TROPONINIHS 2,906* 10,699* 7,673*   BNP: Recent Labs    03/20/23 0558  BNP 59.0   D-Dimer: No results for input(s): "DDIMER" in the last 72 hours. Hemoglobin A1C: Recent Labs    03/20/23 0822  HGBA1C 8.3*   Fasting Lipid Panel: No results for input(s): "CHOL", "HDL", "LDLCALC", "TRIG", "CHOLHDL", "LDLDIRECT" in the last 72 hours. Thyroid Function Tests: No results for input(s): "TSH", "T4TOTAL", "T3FREE",  "THYROIDAB" in the last 72 hours.  Invalid input(s): "FREET3" Anemia Panel: No results for input(s): "VITAMINB12", "FOLATE", "FERRITIN", "TIBC", "IRON", "RETICCTPCT" in the last 72 hours.   Radiology: DG Chest 1 View  Result Date: 03/21/2023 CLINICAL DATA:  62 year old female with history of congestive heart failure. EXAM: CHEST  1 VIEW COMPARISON:  Chest x-ray 03/20/2023. FINDINGS: Vague opacity projecting over the left hemithorax. Right lung appears clear. No right pleural effusion. No pneumothorax. No evidence of pulmonary edema. Heart size is normal. The patient is rotated to the left on today's exam, resulting in distortion of the mediastinal contours and reduced diagnostic sensitivity and specificity for mediastinal pathology. IMPRESSION: 1. Extensive airspace consolidation in the left lung compatible with pneumonia better demonstrated on prior chest CT. Overall, aeration has worsened in the left lung compared to the recent chest x-ray. Electronically Signed   By: Trudie Reed M.D.   On: 03/21/2023 07:22   ECHOCARDIOGRAM COMPLETE  Result Date: 03/20/2023    ECHOCARDIOGRAM REPORT   Patient Name:   Julia Simmons Date of Exam: 03/20/2023 Medical Rec #:  [id number]       Height:       65.0 in Accession #:    [id number]      Weight:       318.0 lb Date of Birth:  [birth year]        BSA:          2.409 m Patient Age:    62 years        BP:           103/78 mmHg Patient Gender: F               HR:           96 bpm. Exam Location:  [facility] Procedure: 2D Echo, Color Doppler, Cardiac Doppler and Intracardiac            Opacification Agent Indications:     NSTEMI I21.4  History:  [facility] CLINIC CARDIOLOGY CONSULT NOTE       Patient ID: Julia Simmons MRN: [id number] DOB/AGE: [birth year] 62 y.o.  Admit date: 03/20/2023 Referring Physician Dr. Mikey College Primary Physician Dr. Maurine Minister Primary Cardiologist Dr. Darrold Junker Reason for Consultation NSTEMI  HPI: Julia Simmons is a 62 y.o. female  with a past medical history of coronary artery disease s/p DES to LAD 11/2021, ischemic cardiomyopathy, chronic HFrEF (EF 35-40% 11/2021), R MCA CVA 11/2021, hypertension, hyperlipidemia, type 2 diabetes, OSA on CPAP, morbid obesity who presented to the ED on 03/20/2023 for malaise, dysuria. Cardiology was consulted for further evaluation.   Interval history: -Patient reports feeling better overall this morning. -Chest x-ray today much improved.  She remains on supplemental oxygen. Denies significant SOB. -BP and heart rate remain well-controlled.  She remains without chest pain or palpitations.   Review of systems complete and found to be negative unless listed above    Past Medical History:  Diagnosis Date   Charcot's joint of foot    Coronary artery disease    Diabetes mellitus with neuropathy (HCC)    Diabetic retinopathy (HCC)    Hypertension    Morbid obesity with BMI of 50.0-59.9, adult (HCC)    Sleep apnea    Stroke [facility])     Past Surgical History:  Procedure Laterality Date   CATARACT EXTRACTION W/PHACO Right 07/18/2022   Procedure: CATARACT EXTRACTION PHACO AND INTRAOCULAR LENS PLACEMENT (IOC) RIGHT DIABETIC HEALON 5 VISION BLUE 33.79 2:31.3;  Surgeon: Lockie Mola, MD;  Location: [facility];  Service: Ophthalmology;  Laterality: Right;   CESAREAN SECTION      Medications Prior to Admission  Medication Sig Dispense Refill Last Dose   albuterol (VENTOLIN HFA) 108 (90 Base) MCG/ACT inhaler Inhale into the lungs.   03/20/2023   aspirin EC 81 MG tablet Take 81 mg by mouth daily.   03/19/2023   atorvastatin (LIPITOR) 40 MG tablet Take by  mouth.   03/19/2023   clopidogrel (PLAVIX) 75 MG tablet Take 75 mg by mouth daily.   03/19/2023   furosemide (LASIX) 40 MG tablet Take 1.5 tablets (60 mg total) by mouth daily. (Patient taking differently: Take 40 mg by mouth 2 (two) times daily.) 30 tablet 1 03/19/2023   guaiFENesin-dextromethorphan (ROBITUSSIN DM) 100-10 MG/5ML syrup Take 10 mLs by mouth every 6 (six) hours as needed for cough. 118 mL 1 prn at unk   Insulin Aspart FlexPen (NOVOLOG) 100 UNIT/ML Inject 64 Units into the skin in the morning and at bedtime. Before lunch and dinner   03/19/2023   insulin glargine (LANTUS) 100 UNIT/ML injection Inject 100 Units into the skin at bedtime.   03/19/2023   metFORMIN (GLUCOPHAGE) 500 MG tablet Take 1,000 mg by mouth daily with breakfast.   03/19/2023   metFORMIN (GLUCOPHAGE) 500 MG tablet Take 1,000 mg by mouth daily with supper.   03/19/2023   metoprolol succinate (TOPROL-XL) 50 MG 24 hr tablet Take 1 tablet by mouth daily.   03/19/2023   oxybutynin (DITROPAN-XL) 10 MG 24 hr tablet Take 1 tablet by mouth daily.   03/19/2023   sacubitril-valsartan (ENTRESTO) 24-26 MG Take 1 tablet by mouth every 12 (twelve) hours.   03/19/2023   spironolactone (ALDACTONE) 25 MG tablet Take by mouth.   03/19/2023   BD INSULIN SYRINGE U/F 31G X 5/16" 1 ML MISC USE AS DIRECTED ONCE DAILY WITH LEVEMIR (VIAL). (Patient not taking: Reported on 07/09/2022)      Continuous Blood Gluc Sensor (FREESTYLE  Msk: Normal strength and tone for age. Extremities: Warm and well perfused. No clubbing, cyanosis.  No edema.  Neuro: Alert and oriented X 3. Psych: Answers questions appropriately.   Labs: Basic Metabolic Panel: Recent Labs    03/21/23 0559 03/22/23 0305  NA 137 139  K 3.6 3.9  CL 104 106  CO2 22 24  GLUCOSE 264* 187*  BUN 40* 29*  CREATININE 1.56* 1.25*  CALCIUM 8.5* 8.4*   Liver Function Tests: Recent Labs    03/20/23 0558  AST 27  ALT 19  ALKPHOS 76  BILITOT 1.0  PROT 7.7  ALBUMIN 3.7   No results for input(s): "LIPASE", "AMYLASE" in the last 72 hours. CBC: Recent Labs    03/20/23 0558 03/21/23 0559 03/22/23 0305  WBC 8.0 8.8 8.3  NEUTROABS 7.2  --   --   HGB 13.5 11.2* 10.7*  HCT 40.2 34.8* 32.5*  MCV 90.3 94.8 92.6  PLT 308 262 257   Cardiac Enzymes: Recent Labs    03/20/23 0822 03/20/23 1415 03/21/23 0559  TROPONINIHS 2,906* 10,699* 7,673*   BNP: Recent Labs    03/20/23 0558  BNP 59.0   D-Dimer: No results for input(s): "DDIMER" in the last 72 hours. Hemoglobin A1C: Recent Labs    03/20/23 0822  HGBA1C 8.3*   Fasting Lipid Panel: No results for input(s): "CHOL", "HDL", "LDLCALC", "TRIG", "CHOLHDL", "LDLDIRECT" in the last 72 hours. Thyroid Function Tests: No results for input(s): "TSH", "T4TOTAL", "T3FREE",  "THYROIDAB" in the last 72 hours.  Invalid input(s): "FREET3" Anemia Panel: No results for input(s): "VITAMINB12", "FOLATE", "FERRITIN", "TIBC", "IRON", "RETICCTPCT" in the last 72 hours.   Radiology: DG Chest 1 View  Result Date: 03/21/2023 CLINICAL DATA:  62 year old female with history of congestive heart failure. EXAM: CHEST  1 VIEW COMPARISON:  Chest x-ray 03/20/2023. FINDINGS: Vague opacity projecting over the left hemithorax. Right lung appears clear. No right pleural effusion. No pneumothorax. No evidence of pulmonary edema. Heart size is normal. The patient is rotated to the left on today's exam, resulting in distortion of the mediastinal contours and reduced diagnostic sensitivity and specificity for mediastinal pathology. IMPRESSION: 1. Extensive airspace consolidation in the left lung compatible with pneumonia better demonstrated on prior chest CT. Overall, aeration has worsened in the left lung compared to the recent chest x-ray. Electronically Signed   By: Trudie Reed M.D.   On: 03/21/2023 07:22   ECHOCARDIOGRAM COMPLETE  Result Date: 03/20/2023    ECHOCARDIOGRAM REPORT   Patient Name:   Julia Simmons Date of Exam: 03/20/2023 Medical Rec #:  [id number]       Height:       65.0 in Accession #:    [id number]      Weight:       318.0 lb Date of Birth:  [birth year]        BSA:          2.409 m Patient Age:    62 years        BP:           103/78 mmHg Patient Gender: F               HR:           96 bpm. Exam Location:  [facility] Procedure: 2D Echo, Color Doppler, Cardiac Doppler and Intracardiac            Opacification Agent Indications:     NSTEMI I21.4  History:  Msk: Normal strength and tone for age. Extremities: Warm and well perfused. No clubbing, cyanosis.  No edema.  Neuro: Alert and oriented X 3. Psych: Answers questions appropriately.   Labs: Basic Metabolic Panel: Recent Labs    03/21/23 0559 03/22/23 0305  NA 137 139  K 3.6 3.9  CL 104 106  CO2 22 24  GLUCOSE 264* 187*  BUN 40* 29*  CREATININE 1.56* 1.25*  CALCIUM 8.5* 8.4*   Liver Function Tests: Recent Labs    03/20/23 0558  AST 27  ALT 19  ALKPHOS 76  BILITOT 1.0  PROT 7.7  ALBUMIN 3.7   No results for input(s): "LIPASE", "AMYLASE" in the last 72 hours. CBC: Recent Labs    03/20/23 0558 03/21/23 0559 03/22/23 0305  WBC 8.0 8.8 8.3  NEUTROABS 7.2  --   --   HGB 13.5 11.2* 10.7*  HCT 40.2 34.8* 32.5*  MCV 90.3 94.8 92.6  PLT 308 262 257   Cardiac Enzymes: Recent Labs    03/20/23 0822 03/20/23 1415 03/21/23 0559  TROPONINIHS 2,906* 10,699* 7,673*   BNP: Recent Labs    03/20/23 0558  BNP 59.0   D-Dimer: No results for input(s): "DDIMER" in the last 72 hours. Hemoglobin A1C: Recent Labs    03/20/23 0822  HGBA1C 8.3*   Fasting Lipid Panel: No results for input(s): "CHOL", "HDL", "LDLCALC", "TRIG", "CHOLHDL", "LDLDIRECT" in the last 72 hours. Thyroid Function Tests: No results for input(s): "TSH", "T4TOTAL", "T3FREE",  "THYROIDAB" in the last 72 hours.  Invalid input(s): "FREET3" Anemia Panel: No results for input(s): "VITAMINB12", "FOLATE", "FERRITIN", "TIBC", "IRON", "RETICCTPCT" in the last 72 hours.   Radiology: DG Chest 1 View  Result Date: 03/21/2023 CLINICAL DATA:  62 year old female with history of congestive heart failure. EXAM: CHEST  1 VIEW COMPARISON:  Chest x-ray 03/20/2023. FINDINGS: Vague opacity projecting over the left hemithorax. Right lung appears clear. No right pleural effusion. No pneumothorax. No evidence of pulmonary edema. Heart size is normal. The patient is rotated to the left on today's exam, resulting in distortion of the mediastinal contours and reduced diagnostic sensitivity and specificity for mediastinal pathology. IMPRESSION: 1. Extensive airspace consolidation in the left lung compatible with pneumonia better demonstrated on prior chest CT. Overall, aeration has worsened in the left lung compared to the recent chest x-ray. Electronically Signed   By: Trudie Reed M.D.   On: 03/21/2023 07:22   ECHOCARDIOGRAM COMPLETE  Result Date: 03/20/2023    ECHOCARDIOGRAM REPORT   Patient Name:   Julia Simmons Date of Exam: 03/20/2023 Medical Rec #:  [id number]       Height:       65.0 in Accession #:    [id number]      Weight:       318.0 lb Date of Birth:  [birth year]        BSA:          2.409 m Patient Age:    62 years        BP:           103/78 mmHg Patient Gender: F               HR:           96 bpm. Exam Location:  [facility] Procedure: 2D Echo, Color Doppler, Cardiac Doppler and Intracardiac            Opacification Agent Indications:     NSTEMI I21.4  History:  [facility] CLINIC CARDIOLOGY CONSULT NOTE       Patient ID: Julia Simmons MRN: [id number] DOB/AGE: [birth year] 62 y.o.  Admit date: 03/20/2023 Referring Physician Dr. Mikey College Primary Physician Dr. Maurine Minister Primary Cardiologist Dr. Darrold Junker Reason for Consultation NSTEMI  HPI: Julia Simmons is a 62 y.o. female  with a past medical history of coronary artery disease s/p DES to LAD 11/2021, ischemic cardiomyopathy, chronic HFrEF (EF 35-40% 11/2021), R MCA CVA 11/2021, hypertension, hyperlipidemia, type 2 diabetes, OSA on CPAP, morbid obesity who presented to the ED on 03/20/2023 for malaise, dysuria. Cardiology was consulted for further evaluation.   Interval history: -Patient reports feeling better overall this morning. -Chest x-ray today much improved.  She remains on supplemental oxygen. Denies significant SOB. -BP and heart rate remain well-controlled.  She remains without chest pain or palpitations.   Review of systems complete and found to be negative unless listed above    Past Medical History:  Diagnosis Date   Charcot's joint of foot    Coronary artery disease    Diabetes mellitus with neuropathy (HCC)    Diabetic retinopathy (HCC)    Hypertension    Morbid obesity with BMI of 50.0-59.9, adult (HCC)    Sleep apnea    Stroke [facility])     Past Surgical History:  Procedure Laterality Date   CATARACT EXTRACTION W/PHACO Right 07/18/2022   Procedure: CATARACT EXTRACTION PHACO AND INTRAOCULAR LENS PLACEMENT (IOC) RIGHT DIABETIC HEALON 5 VISION BLUE 33.79 2:31.3;  Surgeon: Lockie Mola, MD;  Location: [facility];  Service: Ophthalmology;  Laterality: Right;   CESAREAN SECTION      Medications Prior to Admission  Medication Sig Dispense Refill Last Dose   albuterol (VENTOLIN HFA) 108 (90 Base) MCG/ACT inhaler Inhale into the lungs.   03/20/2023   aspirin EC 81 MG tablet Take 81 mg by mouth daily.   03/19/2023   atorvastatin (LIPITOR) 40 MG tablet Take by  mouth.   03/19/2023   clopidogrel (PLAVIX) 75 MG tablet Take 75 mg by mouth daily.   03/19/2023   furosemide (LASIX) 40 MG tablet Take 1.5 tablets (60 mg total) by mouth daily. (Patient taking differently: Take 40 mg by mouth 2 (two) times daily.) 30 tablet 1 03/19/2023   guaiFENesin-dextromethorphan (ROBITUSSIN DM) 100-10 MG/5ML syrup Take 10 mLs by mouth every 6 (six) hours as needed for cough. 118 mL 1 prn at unk   Insulin Aspart FlexPen (NOVOLOG) 100 UNIT/ML Inject 64 Units into the skin in the morning and at bedtime. Before lunch and dinner   03/19/2023   insulin glargine (LANTUS) 100 UNIT/ML injection Inject 100 Units into the skin at bedtime.   03/19/2023   metFORMIN (GLUCOPHAGE) 500 MG tablet Take 1,000 mg by mouth daily with breakfast.   03/19/2023   metFORMIN (GLUCOPHAGE) 500 MG tablet Take 1,000 mg by mouth daily with supper.   03/19/2023   metoprolol succinate (TOPROL-XL) 50 MG 24 hr tablet Take 1 tablet by mouth daily.   03/19/2023   oxybutynin (DITROPAN-XL) 10 MG 24 hr tablet Take 1 tablet by mouth daily.   03/19/2023   sacubitril-valsartan (ENTRESTO) 24-26 MG Take 1 tablet by mouth every 12 (twelve) hours.   03/19/2023   spironolactone (ALDACTONE) 25 MG tablet Take by mouth.   03/19/2023   BD INSULIN SYRINGE U/F 31G X 5/16" 1 ML MISC USE AS DIRECTED ONCE DAILY WITH LEVEMIR (VIAL). (Patient not taking: Reported on 07/09/2022)      Continuous Blood Gluc Sensor (FREESTYLE  Msk: Normal strength and tone for age. Extremities: Warm and well perfused. No clubbing, cyanosis.  No edema.  Neuro: Alert and oriented X 3. Psych: Answers questions appropriately.   Labs: Basic Metabolic Panel: Recent Labs    03/21/23 0559 03/22/23 0305  NA 137 139  K 3.6 3.9  CL 104 106  CO2 22 24  GLUCOSE 264* 187*  BUN 40* 29*  CREATININE 1.56* 1.25*  CALCIUM 8.5* 8.4*   Liver Function Tests: Recent Labs    03/20/23 0558  AST 27  ALT 19  ALKPHOS 76  BILITOT 1.0  PROT 7.7  ALBUMIN 3.7   No results for input(s): "LIPASE", "AMYLASE" in the last 72 hours. CBC: Recent Labs    03/20/23 0558 03/21/23 0559 03/22/23 0305  WBC 8.0 8.8 8.3  NEUTROABS 7.2  --   --   HGB 13.5 11.2* 10.7*  HCT 40.2 34.8* 32.5*  MCV 90.3 94.8 92.6  PLT 308 262 257   Cardiac Enzymes: Recent Labs    03/20/23 0822 03/20/23 1415 03/21/23 0559  TROPONINIHS 2,906* 10,699* 7,673*   BNP: Recent Labs    03/20/23 0558  BNP 59.0   D-Dimer: No results for input(s): "DDIMER" in the last 72 hours. Hemoglobin A1C: Recent Labs    03/20/23 0822  HGBA1C 8.3*   Fasting Lipid Panel: No results for input(s): "CHOL", "HDL", "LDLCALC", "TRIG", "CHOLHDL", "LDLDIRECT" in the last 72 hours. Thyroid Function Tests: No results for input(s): "TSH", "T4TOTAL", "T3FREE",  "THYROIDAB" in the last 72 hours.  Invalid input(s): "FREET3" Anemia Panel: No results for input(s): "VITAMINB12", "FOLATE", "FERRITIN", "TIBC", "IRON", "RETICCTPCT" in the last 72 hours.   Radiology: DG Chest 1 View  Result Date: 03/21/2023 CLINICAL DATA:  62 year old female with history of congestive heart failure. EXAM: CHEST  1 VIEW COMPARISON:  Chest x-ray 03/20/2023. FINDINGS: Vague opacity projecting over the left hemithorax. Right lung appears clear. No right pleural effusion. No pneumothorax. No evidence of pulmonary edema. Heart size is normal. The patient is rotated to the left on today's exam, resulting in distortion of the mediastinal contours and reduced diagnostic sensitivity and specificity for mediastinal pathology. IMPRESSION: 1. Extensive airspace consolidation in the left lung compatible with pneumonia better demonstrated on prior chest CT. Overall, aeration has worsened in the left lung compared to the recent chest x-ray. Electronically Signed   By: Trudie Reed M.D.   On: 03/21/2023 07:22   ECHOCARDIOGRAM COMPLETE  Result Date: 03/20/2023    ECHOCARDIOGRAM REPORT   Patient Name:   Julia Simmons Date of Exam: 03/20/2023 Medical Rec #:  [id number]       Height:       65.0 in Accession #:    [id number]      Weight:       318.0 lb Date of Birth:  [birth year]        BSA:          2.409 m Patient Age:    62 years        BP:           103/78 mmHg Patient Gender: F               HR:           96 bpm. Exam Location:  [facility] Procedure: 2D Echo, Color Doppler, Cardiac Doppler and Intracardiac            Opacification Agent Indications:     NSTEMI I21.4  History:  [facility] CLINIC CARDIOLOGY CONSULT NOTE       Patient ID: Julia Simmons MRN: [id number] DOB/AGE: [birth year] 62 y.o.  Admit date: 03/20/2023 Referring Physician Dr. Mikey College Primary Physician Dr. Maurine Minister Primary Cardiologist Dr. Darrold Junker Reason for Consultation NSTEMI  HPI: Julia Simmons is a 62 y.o. female  with a past medical history of coronary artery disease s/p DES to LAD 11/2021, ischemic cardiomyopathy, chronic HFrEF (EF 35-40% 11/2021), R MCA CVA 11/2021, hypertension, hyperlipidemia, type 2 diabetes, OSA on CPAP, morbid obesity who presented to the ED on 03/20/2023 for malaise, dysuria. Cardiology was consulted for further evaluation.   Interval history: -Patient reports feeling better overall this morning. -Chest x-ray today much improved.  She remains on supplemental oxygen. Denies significant SOB. -BP and heart rate remain well-controlled.  She remains without chest pain or palpitations.   Review of systems complete and found to be negative unless listed above    Past Medical History:  Diagnosis Date   Charcot's joint of foot    Coronary artery disease    Diabetes mellitus with neuropathy (HCC)    Diabetic retinopathy (HCC)    Hypertension    Morbid obesity with BMI of 50.0-59.9, adult (HCC)    Sleep apnea    Stroke [facility])     Past Surgical History:  Procedure Laterality Date   CATARACT EXTRACTION W/PHACO Right 07/18/2022   Procedure: CATARACT EXTRACTION PHACO AND INTRAOCULAR LENS PLACEMENT (IOC) RIGHT DIABETIC HEALON 5 VISION BLUE 33.79 2:31.3;  Surgeon: Lockie Mola, MD;  Location: [facility];  Service: Ophthalmology;  Laterality: Right;   CESAREAN SECTION      Medications Prior to Admission  Medication Sig Dispense Refill Last Dose   albuterol (VENTOLIN HFA) 108 (90 Base) MCG/ACT inhaler Inhale into the lungs.   03/20/2023   aspirin EC 81 MG tablet Take 81 mg by mouth daily.   03/19/2023   atorvastatin (LIPITOR) 40 MG tablet Take by  mouth.   03/19/2023   clopidogrel (PLAVIX) 75 MG tablet Take 75 mg by mouth daily.   03/19/2023   furosemide (LASIX) 40 MG tablet Take 1.5 tablets (60 mg total) by mouth daily. (Patient taking differently: Take 40 mg by mouth 2 (two) times daily.) 30 tablet 1 03/19/2023   guaiFENesin-dextromethorphan (ROBITUSSIN DM) 100-10 MG/5ML syrup Take 10 mLs by mouth every 6 (six) hours as needed for cough. 118 mL 1 prn at unk   Insulin Aspart FlexPen (NOVOLOG) 100 UNIT/ML Inject 64 Units into the skin in the morning and at bedtime. Before lunch and dinner   03/19/2023   insulin glargine (LANTUS) 100 UNIT/ML injection Inject 100 Units into the skin at bedtime.   03/19/2023   metFORMIN (GLUCOPHAGE) 500 MG tablet Take 1,000 mg by mouth daily with breakfast.   03/19/2023   metFORMIN (GLUCOPHAGE) 500 MG tablet Take 1,000 mg by mouth daily with supper.   03/19/2023   metoprolol succinate (TOPROL-XL) 50 MG 24 hr tablet Take 1 tablet by mouth daily.   03/19/2023   oxybutynin (DITROPAN-XL) 10 MG 24 hr tablet Take 1 tablet by mouth daily.   03/19/2023   sacubitril-valsartan (ENTRESTO) 24-26 MG Take 1 tablet by mouth every 12 (twelve) hours.   03/19/2023   spironolactone (ALDACTONE) 25 MG tablet Take by mouth.   03/19/2023   BD INSULIN SYRINGE U/F 31G X 5/16" 1 ML MISC USE AS DIRECTED ONCE DAILY WITH LEVEMIR (VIAL). (Patient not taking: Reported on 07/09/2022)      Continuous Blood Gluc Sensor (FREESTYLE  [facility] CLINIC CARDIOLOGY CONSULT NOTE       Patient ID: Julia Simmons MRN: [id number] DOB/AGE: [birth year] 62 y.o.  Admit date: 03/20/2023 Referring Physician Dr. Mikey College Primary Physician Dr. Maurine Minister Primary Cardiologist Dr. Darrold Junker Reason for Consultation NSTEMI  HPI: Julia Simmons is a 62 y.o. female  with a past medical history of coronary artery disease s/p DES to LAD 11/2021, ischemic cardiomyopathy, chronic HFrEF (EF 35-40% 11/2021), R MCA CVA 11/2021, hypertension, hyperlipidemia, type 2 diabetes, OSA on CPAP, morbid obesity who presented to the ED on 03/20/2023 for malaise, dysuria. Cardiology was consulted for further evaluation.   Interval history: -Patient reports feeling better overall this morning. -Chest x-ray today much improved.  She remains on supplemental oxygen. Denies significant SOB. -BP and heart rate remain well-controlled.  She remains without chest pain or palpitations.   Review of systems complete and found to be negative unless listed above    Past Medical History:  Diagnosis Date   Charcot's joint of foot    Coronary artery disease    Diabetes mellitus with neuropathy (HCC)    Diabetic retinopathy (HCC)    Hypertension    Morbid obesity with BMI of 50.0-59.9, adult (HCC)    Sleep apnea    Stroke [facility])     Past Surgical History:  Procedure Laterality Date   CATARACT EXTRACTION W/PHACO Right 07/18/2022   Procedure: CATARACT EXTRACTION PHACO AND INTRAOCULAR LENS PLACEMENT (IOC) RIGHT DIABETIC HEALON 5 VISION BLUE 33.79 2:31.3;  Surgeon: Lockie Mola, MD;  Location: [facility];  Service: Ophthalmology;  Laterality: Right;   CESAREAN SECTION      Medications Prior to Admission  Medication Sig Dispense Refill Last Dose   albuterol (VENTOLIN HFA) 108 (90 Base) MCG/ACT inhaler Inhale into the lungs.   03/20/2023   aspirin EC 81 MG tablet Take 81 mg by mouth daily.   03/19/2023   atorvastatin (LIPITOR) 40 MG tablet Take by  mouth.   03/19/2023   clopidogrel (PLAVIX) 75 MG tablet Take 75 mg by mouth daily.   03/19/2023   furosemide (LASIX) 40 MG tablet Take 1.5 tablets (60 mg total) by mouth daily. (Patient taking differently: Take 40 mg by mouth 2 (two) times daily.) 30 tablet 1 03/19/2023   guaiFENesin-dextromethorphan (ROBITUSSIN DM) 100-10 MG/5ML syrup Take 10 mLs by mouth every 6 (six) hours as needed for cough. 118 mL 1 prn at unk   Insulin Aspart FlexPen (NOVOLOG) 100 UNIT/ML Inject 64 Units into the skin in the morning and at bedtime. Before lunch and dinner   03/19/2023   insulin glargine (LANTUS) 100 UNIT/ML injection Inject 100 Units into the skin at bedtime.   03/19/2023   metFORMIN (GLUCOPHAGE) 500 MG tablet Take 1,000 mg by mouth daily with breakfast.   03/19/2023   metFORMIN (GLUCOPHAGE) 500 MG tablet Take 1,000 mg by mouth daily with supper.   03/19/2023   metoprolol succinate (TOPROL-XL) 50 MG 24 hr tablet Take 1 tablet by mouth daily.   03/19/2023   oxybutynin (DITROPAN-XL) 10 MG 24 hr tablet Take 1 tablet by mouth daily.   03/19/2023   sacubitril-valsartan (ENTRESTO) 24-26 MG Take 1 tablet by mouth every 12 (twelve) hours.   03/19/2023   spironolactone (ALDACTONE) 25 MG tablet Take by mouth.   03/19/2023   BD INSULIN SYRINGE U/F 31G X 5/16" 1 ML MISC USE AS DIRECTED ONCE DAILY WITH LEVEMIR (VIAL). (Patient not taking: Reported on 07/09/2022)      Continuous Blood Gluc Sensor (FREESTYLE

## 2023-03-22 NOTE — Progress Notes (Signed)
PHARMACY - ANTICOAGULATION CONSULT NOTE  Pharmacy Consult for Heparin Infusion Indication: chest pain/ACS  Allergies  Allergen Reactions   Hydrochlorothiazide Swelling    Facial swelling   Sulfa Antibiotics Swelling    Facial swelling    Patient Measurements: Height: 5\' 5"  (165.1 cm) Weight: (!) 144.3 kg (318 lb 2 oz) IBW/kg (Calculated) : 57 Heparin Dosing Weight: 93.1 kg  Vital Signs: Temp: 98.4 F (36.9 C) (10/03 2348) BP: 108/63 (10/04 0342) Pulse Rate: 83 (10/04 0342)  Labs: Recent Labs    03/20/23 0558 03/20/23 0822 03/20/23 1415 03/20/23 1635 03/21/23 0559 03/21/23 1433 03/22/23 0305  HGB 13.5  --   --   --  11.2*  --  10.7*  HCT 40.2  --   --   --  34.8*  --  32.5*  PLT 308  --   --   --  262  --  257  APTT  --  25  --   --   --   --   --   LABPROT  --  14.1  --   --   --   --   --   INR  --  1.1  --   --   --   --   --   HEPARINUNFRC  --   --  <0.10*   < > 0.22* 0.46 0.40  CREATININE 1.57*  --   --   --  1.56*  --  1.25*  TROPONINIHS 507* 2,906* 10,699*  --  1,308*  --   --    < > = values in this interval not displayed.    Estimated Creatinine Clearance: 69.4 mL/min (A) (by C-G formula based on SCr of 1.25 mg/dL (H)).   Medical History: Past Medical History:  Diagnosis Date   Charcot's joint of foot    Coronary artery disease    Diabetes mellitus with neuropathy (HCC)    Diabetic retinopathy (HCC)    Hypertension    Morbid obesity with BMI of 50.0-59.9, adult (HCC)    Sleep apnea    Stroke [facility])      Assessment: Patient is a 62 year old female with a past medical history of CAD with LAD stenting x 2 in 2023, chronic HFrEF and HFpEF with LVEF 35-40% in 2023, HTN, IDDM with insulin resistance, HLD, morbid obesity, OSA on CPAP at bedtime, CKD stage II, and frequent UTIs who presented with malaise, dysuria, and chills. Pharmacy was consulted to initiate patient on a heparin infusion for ACS/chest pain. Patient was not on anticoagulation prior to  admission, therefore it is appropriate to monitor via heparin levels.  10/2 1415 HL < 0.10 10/02 2238 HL 0.26, subtherapeutic 10/3 0559 HL 0.22  10/3 1433 HL 0.46 10/4 0305 HL 0.4  Goal of Therapy:  Heparin level 0.3-0.7 units/ml Monitor platelets by anticoagulation protocol: Yes   Plan:  Heparin level is therapeutic. Will continue heparin at 1900 units/hr. Recheck heparin level and CBC daily while on heparin.   Otelia Sergeant, PharmD, [facility] 03/22/2023 5:29 AM

## 2023-03-22 NOTE — Plan of Care (Signed)
  Problem: Education: Goal: Knowledge of disease or condition will improve Outcome: Progressing Goal: Knowledge of the prescribed therapeutic regimen will improve Outcome: Progressing Goal: Individualized Educational Video(s) Outcome: Progressing   Problem: Activity: Goal: Ability to tolerate increased activity will improve Outcome: Progressing Goal: Will verbalize the importance of balancing activity with adequate rest periods Outcome: Progressing   Problem: Respiratory: Goal: Ability to maintain a clear airway will improve Outcome: Progressing Goal: Levels of oxygenation will improve Outcome: Progressing Goal: Ability to maintain adequate ventilation will improve Outcome: Progressing   Problem: Activity: Goal: Ability to tolerate increased activity will improve Outcome: Progressing   Problem: Clinical Measurements: Goal: Ability to maintain a body temperature in the normal range will improve Outcome: Progressing   Problem: Respiratory: Goal: Ability to maintain adequate ventilation will improve Outcome: Progressing Goal: Ability to maintain a clear airway will improve Outcome: Progressing   Problem: Education: Goal: Ability to describe self-care measures that may prevent or decrease complications (Diabetes Survival Skills Education) will improve Outcome: Progressing Goal: Individualized Educational Video(s) Outcome: Progressing   Problem: Coping: Goal: Ability to adjust to condition or change in health will improve Outcome: Progressing   Problem: Fluid Volume: Goal: Ability to maintain a balanced intake and output will improve Outcome: Progressing   Problem: Health Behavior/Discharge Planning: Goal: Ability to identify and utilize available resources and services will improve Outcome: Progressing Goal: Ability to manage health-related needs will improve Outcome: Progressing   Problem: Metabolic: Goal: Ability to maintain appropriate glucose levels will  improve Outcome: Progressing   Problem: Nutritional: Goal: Maintenance of adequate nutrition will improve Outcome: Progressing Goal: Progress toward achieving an optimal weight will improve Outcome: Progressing   Problem: Skin Integrity: Goal: Risk for impaired skin integrity will decrease Outcome: Progressing   Problem: Tissue Perfusion: Goal: Adequacy of tissue perfusion will improve Outcome: Progressing   Problem: Education: Goal: Knowledge of General Education information will improve Description: Including pain rating scale, medication(s)/side effects and non-pharmacologic comfort measures Outcome: Progressing   Problem: Health Behavior/Discharge Planning: Goal: Ability to manage health-related needs will improve Outcome: Progressing   Problem: Clinical Measurements: Goal: Ability to maintain clinical measurements within normal limits will improve Outcome: Progressing Goal: Will remain free from infection Outcome: Progressing Goal: Diagnostic test results will improve Outcome: Progressing Goal: Respiratory complications will improve Outcome: Progressing Goal: Cardiovascular complication will be avoided Outcome: Progressing   Problem: Activity: Goal: Risk for activity intolerance will decrease Outcome: Progressing   Problem: Nutrition: Goal: Adequate nutrition will be maintained Outcome: Progressing   Problem: Coping: Goal: Level of anxiety will decrease Outcome: Progressing   Problem: Elimination: Goal: Will not experience complications related to bowel motility Outcome: Progressing Goal: Will not experience complications related to urinary retention Outcome: Progressing   Problem: Pain Managment: Goal: General experience of comfort will improve Outcome: Progressing   Problem: Safety: Goal: Ability to remain free from injury will improve Outcome: Progressing   Problem: Skin Integrity: Goal: Risk for impaired skin integrity will decrease Outcome:  Progressing   

## 2023-03-22 NOTE — Progress Notes (Signed)
[facility] HF Stewardship  PCP: Patrice Paradise, MD  PCP-Cardiologist: None  HPI: Julia Simmons is a 62 y.o. female with hypertension, hyperlipidemia, type 2 diabetes, OSA on CPAP, morbid obesity, CAD s/p DES to LAD 11/2021, ischemic cardiomyopathy, chronic HFrEF (EF 35-40% 11/2021), R MCA CVA 11/2021, who presented with general malaise, dysuria, and chills.  Troponin was 507 on admission, trending up to a peak of 10,699 before trending back down. Suspected type I MI, however may also be type II MI. BNP normal on admission. Lactate of 2.4 on admission. PCT markedly elevated at 24. CTA negative for PE but suggestive of multifocal pneumonia. Bcx showed E. coli bacteremia.   Pertinent cardiac history: TTE in 05/2020 with LVEF of 60-65%, G2DD, normal RV. Cardiac cath at [facility] in 11/2021 showed LAD stenosis treated with DES to the proximal and mid segments requiring Shockwave lithotripsy. Noted ot have 75% stenosis of the RCA at the time. TTE 03/20/23 with LVEF down to 25-30%, G1DD, mild MR, trivial Tr, and small mobile echogenicity noted on TTE.   Pertinent Lab Values: Creatinine, Ser  Date Value Ref Range Status  03/22/2023 1.25 (H) 0.44 - 1.00 mg/dL Final   BUN  Date Value Ref Range Status  03/22/2023 29 (H) 6 - 20 mg/dL Final   Potassium  Date Value Ref Range Status  03/22/2023 3.9 3.5 - 5.1 mmol/L Final   Sodium  Date Value Ref Range Status  03/22/2023 139 135 - 145 mmol/L Final   B Natriuretic Peptide  Date Value Ref Range Status  03/20/2023 59.0 0.0 - 100.0 pg/mL Final    Comment:    Performed at [facility], [address] [place], [place] [address]   Magnesium  Date Value Ref Range Status  06/15/2020 1.8 1.7 - 2.4 mg/dL Final    Comment:    Performed at [facility], [address] [place], [place] [address]   Hgb A1c MFr Bld  Date Value Ref Range Status  03/20/2023 8.3 (H) 4.8 - 5.6 % Final    Comment:    (NOTE) Pre diabetes:           5.7%-6.4%  Diabetes:              >6.4%  Glycemic control for   <7.0% adults with diabetes     Vital Signs: Temp:  [97.7 F (36.5 C)-99.3 F (37.4 C)] 98.4 F (36.9 C) (10/03 2348) Pulse Rate:  [78-90] 83 (10/04 0342) Cardiac Rhythm: Normal sinus rhythm (10/04 0700) Resp:  [18-20] 18 (10/04 0342) BP: (108-145)/(61-82) 108/63 (10/04 0342) SpO2:  [90 %-99 %] 99 % (10/04 0740) FiO2 (%):  [32 %] 32 % (10/03 2218) Weight:  [144.3 kg (318 lb 2 oz)] 144.3 kg (318 lb 2 oz) (10/04 0342)  Intake/Output Summary (Last 24 hours) at 03/22/2023 0813 Last data filed at 03/22/2023 9528 Gross per 24 hour  Intake 744.45 ml  Output 1300 ml  Net -555.55 ml   Current Inpatient Medications:  -Metoprolol succinate 25 mg BID  Prior to admission HF Medications:  -Metoprolol succinate 50 mg daily -Entresto 24-26 mg BID -Spironolactone 25 mg daily -Jardiance 25 mg daily -Furosemide 40 mg BID  Assessment: 1. 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 NYHA class II-III symptoms.  -BNP on admission was normal, creatinine/BUN improved today with holding diuretics, was dry prior to admission on furosemide 40 mg BID in the setting of sepsis. With multiple IV  and PO fluids and creatinine improving, likely okay to resume oral diuretics. Patient not in room today. -BP soft on admission, but now stable in the 100-140s/70s. Agree with holding medications for now with acute bacteremia. If BP trends back up, can begin to restart home medications.  -Not a good candidate for resuming Jardiance at discharge given multiple UTIs including pyelonephritis. -Will follow plans for cardiac cath/TEE next week.  Plan: 1) Medication changes recommended at this time: -Discussed resuming oral diuretics with cardiology pending physical exam.  2) Patient assistance: -None required  3) Education: - Patient has been educated on current HF medications and  potential additions to HF medication regimen - Patient verbalizes understanding that over the next few months, these medication doses may change and more medications may be added to optimize HF regimen - Patient has been educated on basic disease state pathophysiology and goals of therapy  Medication Assistance / Insurance Benefits Check:  Does the patient have prescription insurance?    Type of insurance plan:   Does the patient qualify for medication assistance through manufacturers or grants? No  Outpatient Pharmacy:  Prior to admission outpatient [facility]: [facility]     Please do not hesitate to reach out with questions or concerns,  Enos Fling, PharmD, BCPS Heart Failure Pharmacist Phone - [phone] 03/22/2023 8:13 AM

## 2023-03-22 NOTE — Plan of Care (Signed)
  Problem: Education: Goal: Knowledge of disease or condition will improve Outcome: Progressing   Problem: Respiratory: Goal: Ability to maintain a clear airway will improve Outcome: Progressing   Problem: Clinical Measurements: Goal: Cardiovascular complication will be avoided Outcome: Progressing   Problem: Activity: Goal: Risk for activity intolerance will decrease Outcome: Progressing   Problem: Pain Managment: Goal: General experience of comfort will improve Outcome: Progressing   Problem: Safety: Goal: Ability to remain free from injury will improve Outcome: Progressing

## 2023-03-22 NOTE — Progress Notes (Signed)
PULMONOLOGY         Date: 03/22/2023,   MRN# [id number] Julia Simmons 11-16-1960     AdmissionWeight: (!) 144.2 kg                 CurrentWeight: (!) 144.3 kg  Referring provider: Dr Chipper Herb   CHIEF COMPLAINT:   Multifocal pneumonia with E.coli bacteremia   HISTORY OF PRESENT ILLNESS   This is 62 year old morbidly obese female with a BMI of over 50 who has diabetes with chronic neuropathy, retinopathy and Charcot's foot, severe coronary artery disease status post 2 stents and reports having previous CVA and congestive heart failure, also background history of sleep apnea who came in with shortness of breath cough and chills x 2 days.  She was found to have multifocal pneumonia worse on the left on CT chest imaging with PE protocol.  She did have microbiology including blood culture showing E. coli bacteremia.  Patient is on room air during my evaluation.  She does report acid reflux but denies having aspiration events and denies vomiting.  03/22/23- patient with no acute events overnight.  Net negative 1.5 L and with 2L/min . BMP with improved renal function.   PAST MEDICAL HISTORY   Past Medical History:  Diagnosis Date   Charcot's joint of foot    Coronary artery disease    Diabetes mellitus with neuropathy (HCC)    Diabetic retinopathy (HCC)    Hypertension    Morbid obesity with BMI of 50.0-59.9, adult (HCC)    Sleep apnea    Stroke [facility])      SURGICAL HISTORY   Past Surgical History:  Procedure Laterality Date   CATARACT EXTRACTION W/PHACO Right 07/18/2022   Procedure: CATARACT EXTRACTION PHACO AND INTRAOCULAR LENS PLACEMENT (IOC) RIGHT DIABETIC HEALON 5 VISION BLUE 33.79 2:31.3;  Surgeon: Lockie Mola, MD;  Location: [facility];  Service: Ophthalmology;  Laterality: Right;   CESAREAN SECTION       FAMILY HISTORY   Family History  Problem Relation Age of Onset   Breast cancer Neg Hx      SOCIAL HISTORY   Social History    Tobacco Use   Smoking status: Never   Smokeless tobacco: Never  Vaping Use   Vaping status: Never Used  Substance Use Topics   Alcohol use: Not Currently   Drug use: Never     MEDICATIONS    Home Medication:    Current Medication:  Current Facility-Administered Medications:    acetaminophen (TYLENOL) tablet 650 mg, 650 mg, Oral, Q6H PRN, Mikey College T, MD, 650 mg at 03/21/23 0039   albuterol (PROVENTIL) (2.5 MG/3ML) 0.083% nebulizer solution 3 mL, 3 mL, Nebulization, Q4H PRN, Mikey College T, MD   aspirin EC tablet 81 mg, 81 mg, Oral, Daily, Mikey College T, MD, 81 mg at 03/21/23 0939   atorvastatin (LIPITOR) tablet 40 mg, 40 mg, Oral, Daily, Mikey College T, MD, 40 mg at 03/21/23 0936   azithromycin (ZITHROMAX) tablet 500 mg, 500 mg, Oral, Daily, Mikey College T, MD, 500 mg at 03/21/23 4098   bacitracin ointment, , Topical, BID, Marrion Coy, MD, Given at 03/21/23 2228   budesonide (PULMICORT) nebulizer solution 2 mg, 2 mg, Nebulization, Q12H, Mikey College T, MD, 2 mg at 03/22/23 0740   cefTRIAXone (ROCEPHIN) 2 g in sodium chloride 0.9 % 100 mL IVPB, 2 g, Intravenous, Q24H, Emeline General, MD, Stopped at 03/21/23 1130   clopidogrel (PLAVIX) tablet 75 mg, 75 mg, Oral,  Daily, Mikey College T, MD, 75 mg at 03/21/23 2952   guaiFENesin-dextromethorphan (ROBITUSSIN DM) 100-10 MG/5ML syrup 10 mL, 10 mL, Oral, Q6H PRN, Mikey College T, MD   heparin ADULT infusion 100 units/mL (25000 units/279mL), 1,900 Units/hr, Intravenous, Continuous, Ronnald Ramp, RPH, Last Rate: 19 mL/hr at 03/22/23 0059, 1,900 Units/hr at 03/22/23 0059   insulin aspart (novoLOG) injection 0-20 Units, 0-20 Units, Subcutaneous, TID WC, Emeline General, MD, 7 Units at 03/21/23 1737   insulin aspart (novoLOG) injection 0-5 Units, 0-5 Units, Subcutaneous, QHS, Mikey College T, MD, 2 Units at 03/20/23 2213   insulin glargine-yfgn (SEMGLEE) injection 10 Units, 10 Units, Subcutaneous, Daily, Mikey College T, MD, 10 Units at 03/21/23 0943    ipratropium-albuterol (DUONEB) 0.5-2.5 (3) MG/3ML nebulizer solution 3 mL, 3 mL, Nebulization, BID, Marrion Coy, MD, 3 mL at 03/22/23 0740   linagliptin (TRADJENTA) tablet 5 mg, 5 mg, Oral, Daily, Mikey College T, MD   metoprolol succinate (TOPROL-XL) 24 hr tablet 25 mg, 25 mg, Oral, BID, Hudson, Caralyn, PA-C, 25 mg at 03/21/23 2227   ondansetron [facility]) injection 4 mg, 4 mg, Intravenous, Q6H PRN, Mikey College T, MD, 4 mg at 03/21/23 1737   oxybutynin (DITROPAN-XL) 24 hr tablet 10 mg, 10 mg, Oral, Daily, Chipper Herb, Ping T, MD, 10 mg at 03/21/23 0940    ALLERGIES   Hydrochlorothiazide and Sulfa antibiotics     REVIEW OF SYSTEMS    Review of Systems:  Gen:  Denies  fever, sweats, chills weigh loss  HEENT: Denies blurred vision, double vision, ear pain, eye pain, hearing loss, nose bleeds, sore throat Cardiac:  No dizziness, chest pain or heaviness, chest tightness,edema Resp:   reports dyspnea chronically  Gi: Denies swallowing difficulty, stomach pain, nausea or vomiting, diarrhea, constipation, bowel incontinence Gu:  Denies bladder incontinence, burning urine Ext:   Denies Joint pain, stiffness or swelling Skin: Denies  skin rash, easy bruising or bleeding or hives Endoc:  Denies polyuria, polydipsia , polyphagia or weight change Psych:   Denies depression, insomnia or hallucinations   Other:  All other systems negative   VS: BP 108/63 (BP Location: Right Arm)   Pulse 83   Temp 98.4 F (36.9 C)   Resp 18   Ht 5\' 5"  (1.651 m)   Wt (!) 144.3 kg   SpO2 99%   BMI 52.94 kg/m      PHYSICAL EXAM    GENERAL:NAD, no fevers, chills, no weakness no fatigue HEAD: Normocephalic, atraumatic.  EYES: Pupils equal, round, reactive to light. Extraocular muscles intact. No scleral icterus.  MOUTH: Moist mucosal membrane. Dentition intact. No abscess noted.  EAR, NOSE, THROAT: Clear without exudates. No external lesions.  NECK: Supple. No thyromegaly. No nodules. No JVD.  PULMONARY:  decreased breath sounds with mild rhonchi worse at bases bilaterally.  CARDIOVASCULAR: S1 and S2. Regular rate and rhythm. No murmurs, rubs, or gallops. No edema. Pedal pulses 2+ bilaterally.  GASTROINTESTINAL: Soft, nontender, nondistended. No masses. Positive bowel sounds. No hepatosplenomegaly.  MUSCULOSKELETAL: No swelling, clubbing, or edema. Range of motion full in all extremities.  NEUROLOGIC: Cranial nerves II through XII are intact. No gross focal neurological deficits. Sensation intact. Reflexes intact.  SKIN: No ulceration, lesions, rashes, or cyanosis. Skin warm and dry. Turgor intact.  PSYCHIATRIC: Mood, affect within normal limits. The patient is awake, alert and oriented x 3. Insight, judgment intact.       IMAGING   \  ASSESSMENT/PLAN   Multifocal pneumonia -suspecti due to e.coli -  present on admission  - COVID19 negative  - supplemental O2 during my evaluation room air - will perform infectious workup for pneumonia -Respiratory viral panel -serum fungitell -legionella ab -strep pneumoniae ur AG -Histoplasma Ur Ag -sputum resp cultures -reviewed pertinent imaging with patient today -PT/OT for d/c planning  -please encourage patient to use incentive spirometer few times each hour while hospitalized.       Ecoli bacteremia   - patient without shock physiology     - consider ID consultation     - continue antibiotics , currently on zithromax and rocephin     OSA    Continue home CPAP with settings as per RT   - orders placed    Thank you for allowing me to participate in the care of this patient.   Patient/Family are satisfied with care plan and all questions have been answered.    Provider disclosure: Patient with at least one acute or chronic illness or injury that poses a threat to life or bodily function and is being managed actively during this encounter.  All of the below services have been performed independently by signing provider:  review  of prior documentation from internal and or external health records.  Review of previous and current lab results.  Interview and comprehensive assessment during patient visit today. Review of current and previous chest radiographs/CT scans. Discussion of management and test interpretation with health care team and patient/family.   This document was prepared using Dragon voice recognition software and may include unintentional dictation errors.     Vida Rigger, M.D.  Division of Pulmonary & Critical Care Medicine

## 2023-03-22 NOTE — Progress Notes (Signed)
Progress Note   Patient: Julia Simmons MVH:[id number] DOB: [birth year] DOA: 03/20/2023     2 DOS: the patient was seen and examined on 03/22/2023   Brief hospital course: AMAR GROSHONG is a 62 y.o. female with medical history significant of CAD with LAD stenting x 2 in 2023, chronic HFrEF and HFpEF with LVEF 35-40% in 2023, HTN, IDDM with insulin resistance, HLD, morbid obesity, OSA on CPAP at bedtime, CKD stage II, frequent UTIs, presented with malaise, dysuria, chills.  Patient also has significant short of breath, hypoxemia of 85%, she was hypotensive at time of admission, blood pressure 76/37, oxygen saturation 85% on room air.  Patient received IV fluid being bolus in the emergency room.   Chest CT scan showed a large pneumonia involving the entire left lung. Blood culture also positive for E. coli. Patient is treated with Rocephin and Zithromax.    Principal Problem:   Severe sepsis (HCC) Active Problems:   PNA (pneumonia)   Diabetes mellitus with neuropathy (HCC)   Hypertension   Morbid obesity with BMI of 50.0-59.9, adult (HCC)   Sepsis secondary to UTI [facility])   CAD S/P percutaneous coronary angioplasty   NSTEMI (non-ST elevated myocardial infarction) (HCC)   E. coli septicemia (HCC)   Chronic systolic CHF (congestive heart failure) (HCC)   Acute hypoxemic respiratory failure (HCC)   Assessment and Plan: Acute respiratory failure with hypoxemia. Left lung pneumonia. Personally reviewed patient chest CT head scan images, patient has extensive pneumonia involving the entire left lung.  This appears to be bacterial, procalcitonin level significantly increased.  Patient developed significant respiratory distress and hypoxemia. Patient does not have significant dysphagia, she vomited the day prior. Repeated chest x-ray today showed significant improvement in the left lung pneumonia.  Continue Rocephin and Zithromax.  Severe sepsis secondary to pneumonia. E. coli  septicemia. Possible vegetation in aortic valve. E. coli UTI and pyelonephritis. Patient met sepsis criteria with significant tachycardia and tachypnea, additionally, patient has elevated lactic acid, elevated troponin, significant hypotension. Patient blood culture was positive for E. coli, echocardiogram showed possible small vegetation in the aortic valve.  Cardiology is seeing the patient, TEE when patient condition improves. Source of bacteremia could be from urinary, CT abdomen/pelvis showed evidence of pyelonephritis.  Urine culture also showed E. coli. Continue high-dose Rocephin. CT scan also suspect ureter abnormality with possibility of malignancy.  Outpatient follow-up with urology.   Non-STEMI. Chronic systolic congestive heart failure. Patient troponin is more than 10,000, cardiology to see the patient, considering workup when condition is more stable.  Currently on IV heparin infusion since admission. Currently, patient is euvolemic.   Uncontrolled type 2 diabetes with hyperglycemia. Glucose running high from stress, continue sliding scale insulin and scheduled insulin.   Chronic kidney disease stage IIIb. Patient renal function appears to be stable at this time.   Morbid obesity. Obstructive sleep apnea. CPAP while asleep.      Subjective:  Patient doing much better today, short of breath is improving.  She did have a cough, largely nonproductive.  Physical Exam: Vitals:   03/21/23 2348 03/22/23 0342 03/22/23 0740 03/22/23 0900  BP: 118/68 108/63  128/66  Pulse: 87 83  85  Resp: 20 18  (!) 28  Temp: 98.4 F (36.9 C)   99.6 F (37.6 C)  TempSrc:    Oral  SpO2: 90% 91% 99% 90%  Weight:  (!) 144.3 kg    Height:       General exam: Appears calm and  comfortable, morbidly obese. Respiratory system: Clear to auscultation. Respiratory effort normal. Cardiovascular system: S1 & S2 heard, RRR. No JVD, murmurs, rubs, gallops or clicks. No pedal  edema. Gastrointestinal system: Abdomen is nondistended, soft and nontender. No organomegaly or masses felt. Normal bowel sounds heard. Central nervous system: Alert and oriented. No focal neurological deficits. Extremities: Symmetric 5 x 5 power. Skin: No rashes, lesions or ulcers Psychiatry: Judgement and insight appear normal. Mood & affect appropriate.    Data Reviewed:  Reviewed the chest x-ray image today, lab results.  Family Communication: None  Disposition: Status is: Inpatient Remains inpatient appropriate because: Severity of disease, IV treatment.     Time spent: 35 minutes  Author: Marrion Coy, MD 03/22/2023 11:46 AM  For on call review [email or web address].

## 2023-03-22 NOTE — Progress Notes (Signed)
Mobility Specialist - Progress Note    During mobility: SpO2(89) Post-mobility:SPO2(96)     03/22/23 1526  Mobility  Activity Ambulated with assistance in room  Level of Assistance Standby assist, set-up cues, supervision of patient - no hands on  Assistive Device None  Distance Ambulated (ft) 35 ft  Range of Motion/Exercises Active  Activity Response Tolerated well  Mobility Referral Yes  $Mobility charge 1 Mobility  Mobility Specialist Start Time (ACUTE ONLY) 1515  Mobility Specialist Stop Time (ACUTE ONLY) 1526  Mobility Specialist Time Calculation (min) (ACUTE ONLY) 11 min   Pt resting EOB upon entry on 2L. Pt STS and ambulates in room around bed x2 SBA with no RW. Pt requested to only ambulate in room. Pt SpO2 88% during ambulation but expressed no SOB or discomfort. Pt returned to sitting EOB and left with needs in reach.   Johnathan Hausen Mobility Specialist 03/22/23, 3:33 PM

## 2023-03-23 DIAGNOSIS — A4151 Sepsis due to Escherichia coli [E. coli]: Secondary | ICD-10-CM | POA: Diagnosis not present

## 2023-03-23 DIAGNOSIS — J9601 Acute respiratory failure with hypoxia: Secondary | ICD-10-CM | POA: Diagnosis not present

## 2023-03-23 DIAGNOSIS — J189 Pneumonia, unspecified organism: Secondary | ICD-10-CM | POA: Diagnosis not present

## 2023-03-23 DIAGNOSIS — A419 Sepsis, unspecified organism: Secondary | ICD-10-CM | POA: Diagnosis not present

## 2023-03-23 LAB — GLUCOSE, CAPILLARY
Glucose-Capillary: 242 mg/dL — ABNORMAL HIGH (ref 70–99)
Glucose-Capillary: 266 mg/dL — ABNORMAL HIGH (ref 70–99)
Glucose-Capillary: 296 mg/dL — ABNORMAL HIGH (ref 70–99)
Glucose-Capillary: 302 mg/dL — ABNORMAL HIGH (ref 70–99)

## 2023-03-23 LAB — HEPARIN LEVEL (UNFRACTIONATED): Heparin Unfractionated: 0.33 [IU]/mL (ref 0.30–0.70)

## 2023-03-23 LAB — CULTURE, BLOOD (ROUTINE X 2): Special Requests: ADEQUATE

## 2023-03-23 LAB — CBC
HCT: 31 % — ABNORMAL LOW (ref 36.0–46.0)
Hemoglobin: 10.1 g/dL — ABNORMAL LOW (ref 12.0–15.0)
MCH: 30.5 pg (ref 26.0–34.0)
MCHC: 32.6 g/dL (ref 30.0–36.0)
MCV: 93.7 fL (ref 80.0–100.0)
Platelets: 253 10*3/uL (ref 150–400)
RBC: 3.31 MIL/uL — ABNORMAL LOW (ref 3.87–5.11)
RDW: 14 % (ref 11.5–15.5)
WBC: 6.7 10*3/uL (ref 4.0–10.5)
nRBC: 0 % (ref 0.0–0.2)

## 2023-03-23 MED ORDER — LACTULOSE 10 GM/15ML PO SOLN
20.0000 g | Freq: Once | ORAL | Status: AC
  Administered 2023-03-23: 20 g via ORAL
  Filled 2023-03-23: qty 30

## 2023-03-23 MED ORDER — INSULIN GLARGINE-YFGN 100 UNIT/ML ~~LOC~~ SOLN
18.0000 [IU] | Freq: Every day | SUBCUTANEOUS | Status: DC
  Administered 2023-03-23 – 2023-03-24 (×2): 18 [IU] via SUBCUTANEOUS
  Filled 2023-03-23 (×2): qty 0.18

## 2023-03-23 NOTE — Progress Notes (Signed)
Progress Note   Patient: Julia Simmons [id number]:[id number] DOB: 03-29-1961 DOA: 03/20/2023     3 DOS: the patient was seen and examined on 03/23/2023   Brief hospital course: TATEANNA FAREWELL is a 62 y.o. female with medical history significant of CAD with LAD stenting x 2 in 2023, chronic HFrEF and HFpEF with LVEF 35-40% in 2023, HTN, IDDM with insulin resistance, HLD, morbid obesity, OSA on CPAP at bedtime, CKD stage II, frequent UTIs, presented with malaise, dysuria, chills.  Patient also has significant short of breath, hypoxemia of 85%, she was hypotensive at time of admission, blood pressure 76/37, oxygen saturation 85% on room air.  Patient received IV fluid being bolus in the emergency room.   Chest CT scan showed a large pneumonia involving the entire left lung. Blood culture also positive for E. coli. Patient is treated with Rocephin and Zithromax.    Principal Problem:   Severe sepsis (HCC) Active Problems:   PNA (pneumonia)   Diabetes mellitus with neuropathy (HCC)   Hypertension   Morbid obesity with BMI of 50.0-59.9, adult (HCC)   Sepsis secondary to UTI [facility])   CAD S/P percutaneous coronary angioplasty   NSTEMI (non-ST elevated myocardial infarction) (HCC)   E. coli septicemia (HCC)   Chronic systolic CHF (congestive heart failure) (HCC)   Acute hypoxemic respiratory failure (HCC)   E coli bacteremia   Assessment and Plan: Acute respiratory failure with hypoxemia. Left lung pneumonia. Personally reviewed patient chest CT head scan images, patient has extensive pneumonia involving the entire left lung.  This appears to be bacterial, procalcitonin level significantly increased.  Patient developed significant respiratory distress and hypoxemia. Patient does not have significant dysphagia, she vomited the day prior. Repeated chest x-ray 10/4 showed significant improvement in the left lung pneumonia.  Continue Rocephin and Zithromax. Oxygenation continues to improve.    Severe sepsis secondary to pneumonia. E. coli septicemia. Possible vegetation in aortic valve. E. coli UTI and pyelonephritis. Patient met sepsis criteria with significant tachycardia and tachypnea, additionally, patient has elevated lactic acid, elevated troponin, significant hypotension. Patient blood culture was positive for E. coli, echocardiogram showed possible small vegetation in the aortic valve.  Cardiology is seeing the patient. Source of bacteremia could be from urinary, CT abdomen/pelvis showed evidence of pyelonephritis.  Urine culture also showed E. coli. Continue high-dose Rocephin. CT scan also suspect ureter abnormality with possibility of malignancy.  Outpatient follow-up with urology. Discussed with cardiology, TEE will be performed on Monday to determine the length of antibiotics.  Patient clinically improved.   Non-STEMI. Chronic systolic congestive heart failure. Patient troponin is more than 10,000. Discussed with cardiology, patient will be treated with aspirin and Plavix, discontinue heparin drip.  Planning heart cath on Tuesday.  Repeat blood culture sent out today to make sure is negative.   Uncontrolled type 2 diabetes with hyperglycemia. Glucose running high from stress, continue sliding scale insulin and scheduled insulin.   Chronic kidney disease stage IIIb. Patient renal function appears to be stable at this time.   Morbid obesity. Obstructive sleep apnea. CPAP while asleep.       Subjective:  Patient is doing much improved today.  Short of breath much better.  Physical Exam: Vitals:   03/23/23 0027 03/23/23 0510 03/23/23 0733 03/23/23 0825  BP: (!) 126/58 123/62 (!) 121/59   Pulse: 75 71 65 63  Resp: 18 18 16 16   Temp: 98.3 F (36.8 C) 98.4 F (36.9 C) 98.5 F (36.9 C)   TempSrc:  Oral Oral Oral   SpO2: 96% 94% 94% 93%  Weight:  (!) 145.7 kg    Height:       General exam: Appears calm and comfortable, morbidly obese. Respiratory  system: Clear to auscultation. Respiratory effort normal. Cardiovascular system: S1 & S2 heard, RRR. No JVD, murmurs, rubs, gallops or clicks. No pedal edema. Gastrointestinal system: Abdomen is nondistended, soft and nontender. No organomegaly or masses felt. Normal bowel sounds heard. Central nervous system: Alert and oriented. No focal neurological deficits. Extremities: Symmetric 5 x 5 power. Skin: No rashes, lesions or ulcers Psychiatry: Judgement and insight appear normal. Mood & affect appropriate.    Data Reviewed:  Lab results reviewed.  Family Communication: Husband updated at the bedside.  Disposition: Status is: Inpatient Remains inpatient appropriate because: Severity of disease, IV treatment.     Time spent: 35 minutes  Author: Marrion Coy, MD 03/23/2023 12:25 PM  For on call review [email or web address].

## 2023-03-23 NOTE — Progress Notes (Signed)
results for input(s): "AST", "ALT", "ALKPHOS", "BILITOT", "PROT", "ALBUMIN" in the last 72 hours.  No results for input(s): "LIPASE", "AMYLASE" in the last 72 hours. CBC: Recent Labs    03/22/23 0305 03/23/23 0627  WBC 8.3 6.7  HGB 10.7* 10.1*  HCT 32.5* 31.0*  MCV 92.6 93.7  PLT 257 253   Cardiac Enzymes: Recent Labs    03/20/23 1415 03/21/23 0559  TROPONINIHS 10,699* 7,673*   BNP: No results for input(s): "BNP" in the last 72 hours.  D-Dimer: No results for input(s): "DDIMER" in the last 72 hours. Hemoglobin A1C: No results for input(s): "HGBA1C" in the last 72 hours.  Fasting Lipid Panel: No results for input(s): "CHOL", "HDL", "LDLCALC", "TRIG", "CHOLHDL", "LDLDIRECT" in the last 72 hours. Thyroid Function Tests: No results for input(s): "TSH", "T4TOTAL", "T3FREE", "THYROIDAB" in the last 72 hours.  Invalid input(s): "FREET3" Anemia Panel: No results for input(s): "VITAMINB12", "FOLATE", "FERRITIN", "TIBC", "IRON", "RETICCTPCT" in the last 72 hours.   Radiology: DG Chest 2 View  Result Date: 03/22/2023 CLINICAL DATA:  Pneumonia EXAM: CHEST - 2 VIEW COMPARISON:  03/21/2023 FINDINGS: Cardiomegaly. Mild, diffuse interstitial opacity similar to prior examination. No focal airspace opacity. The visualized skeletal structures are unremarkable. IMPRESSION: Cardiomegaly with mild, diffuse interstitial opacity similar to prior examination, consistent with edema or atypical/viral infection. No focal  airspace opacity. Electronically Signed   By: Jearld Lesch M.D.   On: 03/22/2023 12:47   DG Chest 1 View  Result Date: 03/21/2023 CLINICAL DATA:  62 year old female with history of congestive heart failure. EXAM: CHEST  1 VIEW COMPARISON:  Chest x-ray 03/20/2023. FINDINGS: Vague opacity projecting over the left hemithorax. Right lung appears clear. No right pleural effusion. No pneumothorax. No evidence of pulmonary edema. Heart size is normal. The patient is rotated to the left on today's exam, resulting in distortion of the mediastinal contours and reduced diagnostic sensitivity and specificity for mediastinal pathology. IMPRESSION: 1. Extensive airspace consolidation in the left lung compatible with pneumonia better demonstrated on prior chest CT. Overall, aeration has worsened in the left lung compared to the recent chest x-ray. Electronically Signed   By: Trudie Reed M.D.   On: 03/21/2023 07:22   ECHOCARDIOGRAM COMPLETE  Result Date: 03/20/2023    ECHOCARDIOGRAM REPORT   Patient Name:   Julia Simmons Date of Exam: 03/20/2023 Medical Rec #:  [id number]       Height:       65.0 in Accession #:    [id number]      Weight:       318.0 lb Date of Birth:  09/15/1960        BSA:          2.409 m Patient Age:    62 years        BP:           103/78 mmHg Patient Gender: F               HR:           96 bpm. Exam Location:  [facility] Procedure: 2D Echo, Color Doppler, Cardiac Doppler and Intracardiac            Opacification Agent Indications:     NSTEMI I21.4  History:         Patient has prior history of Echocardiogram examinations, most                  recent 06/17/2020. Risk Factors:Hypertension, Diabetes and  [facility] CLINIC CARDIOLOGY CONSULT NOTE       Patient ID: Julia Simmons MRN: [id number] DOB/AGE: [birth year] 62 y.o.  Admit date: 03/20/2023 Referring Physician Dr. Mikey College Primary Physician Dr. Maurine Minister Primary Cardiologist Dr. Darrold Junker Reason for Consultation NSTEMI  HPI: Julia Simmons is a 62 y.o. female  with a past medical history of coronary artery disease s/p DES to LAD 11/2021, ischemic cardiomyopathy, chronic HFrEF (EF 35-40% 11/2021), R MCA CVA 11/2021, hypertension, hyperlipidemia, type 2 diabetes, OSA on CPAP, morbid obesity who presented to the ED on 03/20/2023 for malaise, dysuria. Cardiology was consulted for further evaluation of NSTEMI.  Today patient details that she fells fine. No SOB. No chest pain. Resting comfortably in bed.  Past Medical History:  Diagnosis Date   Charcot's joint of foot    Coronary artery disease    Diabetes mellitus with neuropathy (HCC)    Diabetic retinopathy (HCC)    Hypertension    Morbid obesity with BMI of 50.0-59.9, adult (HCC)    Sleep apnea    Stroke [facility])     Past Surgical History:  Procedure Laterality Date   CATARACT EXTRACTION W/PHACO Right 07/18/2022   Procedure: CATARACT EXTRACTION PHACO AND INTRAOCULAR LENS PLACEMENT (IOC) RIGHT DIABETIC HEALON 5 VISION BLUE 33.79 2:31.3;  Surgeon: Lockie Mola, MD;  Location: [facility];  Service: Ophthalmology;  Laterality: Right;   CESAREAN SECTION      Medications Prior to Admission  Medication Sig Dispense Refill Last Dose   albuterol (VENTOLIN HFA) 108 (90 Base) MCG/ACT inhaler Inhale into the lungs.   03/20/2023   aspirin EC 81 MG tablet Take 81 mg by mouth daily.   03/19/2023   atorvastatin (LIPITOR) 40 MG tablet Take by mouth.   03/19/2023   clopidogrel (PLAVIX) 75 MG tablet Take 75 mg by mouth daily.   03/19/2023   empagliflozin (JARDIANCE) 25 MG TABS tablet Take 25 mg by mouth daily.      furosemide (LASIX) 40 MG tablet Take 1.5 tablets (60 mg total)  by mouth daily. (Patient taking differently: Take 40 mg by mouth 2 (two) times daily.) 30 tablet 1 03/19/2023   guaiFENesin-dextromethorphan (ROBITUSSIN DM) 100-10 MG/5ML syrup Take 10 mLs by mouth every 6 (six) hours as needed for cough. 118 mL 1 prn at unk   Insulin Aspart FlexPen (NOVOLOG) 100 UNIT/ML Inject 64 Units into the skin in the morning and at bedtime. Before lunch and dinner   03/19/2023   insulin glargine (LANTUS) 100 UNIT/ML injection Inject 100 Units into the skin at bedtime.   03/19/2023   metFORMIN (GLUCOPHAGE) 500 MG tablet Take 1,000 mg by mouth daily with breakfast.   03/19/2023   metFORMIN (GLUCOPHAGE) 500 MG tablet Take 1,000 mg by mouth daily with supper.   03/19/2023   metoprolol succinate (TOPROL-XL) 50 MG 24 hr tablet Take 1 tablet by mouth daily.   03/19/2023   oxybutynin (DITROPAN-XL) 10 MG 24 hr tablet Take 1 tablet by mouth daily.   03/19/2023   sacubitril-valsartan (ENTRESTO) 24-26 MG Take 1 tablet by mouth every 12 (twelve) hours.   03/19/2023   spironolactone (ALDACTONE) 25 MG tablet Take by mouth.   03/19/2023   BD INSULIN SYRINGE U/F 31G X 5/16" 1 ML MISC USE AS DIRECTED ONCE DAILY WITH LEVEMIR (VIAL). (Patient not taking: Reported on 07/09/2022)      Continuous Blood Gluc Sensor (FREESTYLE LIBRE SENSOR SYSTEM) MISC Use 1 kit every 14 (fourteen) days for glucose monitoring  results for input(s): "AST", "ALT", "ALKPHOS", "BILITOT", "PROT", "ALBUMIN" in the last 72 hours.  No results for input(s): "LIPASE", "AMYLASE" in the last 72 hours. CBC: Recent Labs    03/22/23 0305 03/23/23 0627  WBC 8.3 6.7  HGB 10.7* 10.1*  HCT 32.5* 31.0*  MCV 92.6 93.7  PLT 257 253   Cardiac Enzymes: Recent Labs    03/20/23 1415 03/21/23 0559  TROPONINIHS 10,699* 7,673*   BNP: No results for input(s): "BNP" in the last 72 hours.  D-Dimer: No results for input(s): "DDIMER" in the last 72 hours. Hemoglobin A1C: No results for input(s): "HGBA1C" in the last 72 hours.  Fasting Lipid Panel: No results for input(s): "CHOL", "HDL", "LDLCALC", "TRIG", "CHOLHDL", "LDLDIRECT" in the last 72 hours. Thyroid Function Tests: No results for input(s): "TSH", "T4TOTAL", "T3FREE", "THYROIDAB" in the last 72 hours.  Invalid input(s): "FREET3" Anemia Panel: No results for input(s): "VITAMINB12", "FOLATE", "FERRITIN", "TIBC", "IRON", "RETICCTPCT" in the last 72 hours.   Radiology: DG Chest 2 View  Result Date: 03/22/2023 CLINICAL DATA:  Pneumonia EXAM: CHEST - 2 VIEW COMPARISON:  03/21/2023 FINDINGS: Cardiomegaly. Mild, diffuse interstitial opacity similar to prior examination. No focal airspace opacity. The visualized skeletal structures are unremarkable. IMPRESSION: Cardiomegaly with mild, diffuse interstitial opacity similar to prior examination, consistent with edema or atypical/viral infection. No focal  airspace opacity. Electronically Signed   By: Jearld Lesch M.D.   On: 03/22/2023 12:47   DG Chest 1 View  Result Date: 03/21/2023 CLINICAL DATA:  62 year old female with history of congestive heart failure. EXAM: CHEST  1 VIEW COMPARISON:  Chest x-ray 03/20/2023. FINDINGS: Vague opacity projecting over the left hemithorax. Right lung appears clear. No right pleural effusion. No pneumothorax. No evidence of pulmonary edema. Heart size is normal. The patient is rotated to the left on today's exam, resulting in distortion of the mediastinal contours and reduced diagnostic sensitivity and specificity for mediastinal pathology. IMPRESSION: 1. Extensive airspace consolidation in the left lung compatible with pneumonia better demonstrated on prior chest CT. Overall, aeration has worsened in the left lung compared to the recent chest x-ray. Electronically Signed   By: Trudie Reed M.D.   On: 03/21/2023 07:22   ECHOCARDIOGRAM COMPLETE  Result Date: 03/20/2023    ECHOCARDIOGRAM REPORT   Patient Name:   Julia Simmons Date of Exam: 03/20/2023 Medical Rec #:  [id number]       Height:       65.0 in Accession #:    [id number]      Weight:       318.0 lb Date of Birth:  09/15/1960        BSA:          2.409 m Patient Age:    62 years        BP:           103/78 mmHg Patient Gender: F               HR:           96 bpm. Exam Location:  [facility] Procedure: 2D Echo, Color Doppler, Cardiac Doppler and Intracardiac            Opacification Agent Indications:     NSTEMI I21.4  History:         Patient has prior history of Echocardiogram examinations, most                  recent 06/17/2020. Risk Factors:Hypertension, Diabetes and  [facility] CLINIC CARDIOLOGY CONSULT NOTE       Patient ID: Julia Simmons MRN: [id number] DOB/AGE: [birth year] 62 y.o.  Admit date: 03/20/2023 Referring Physician Dr. Mikey College Primary Physician Dr. Maurine Minister Primary Cardiologist Dr. Darrold Junker Reason for Consultation NSTEMI  HPI: Julia Simmons is a 62 y.o. female  with a past medical history of coronary artery disease s/p DES to LAD 11/2021, ischemic cardiomyopathy, chronic HFrEF (EF 35-40% 11/2021), R MCA CVA 11/2021, hypertension, hyperlipidemia, type 2 diabetes, OSA on CPAP, morbid obesity who presented to the ED on 03/20/2023 for malaise, dysuria. Cardiology was consulted for further evaluation of NSTEMI.  Today patient details that she fells fine. No SOB. No chest pain. Resting comfortably in bed.  Past Medical History:  Diagnosis Date   Charcot's joint of foot    Coronary artery disease    Diabetes mellitus with neuropathy (HCC)    Diabetic retinopathy (HCC)    Hypertension    Morbid obesity with BMI of 50.0-59.9, adult (HCC)    Sleep apnea    Stroke [facility])     Past Surgical History:  Procedure Laterality Date   CATARACT EXTRACTION W/PHACO Right 07/18/2022   Procedure: CATARACT EXTRACTION PHACO AND INTRAOCULAR LENS PLACEMENT (IOC) RIGHT DIABETIC HEALON 5 VISION BLUE 33.79 2:31.3;  Surgeon: Lockie Mola, MD;  Location: [facility];  Service: Ophthalmology;  Laterality: Right;   CESAREAN SECTION      Medications Prior to Admission  Medication Sig Dispense Refill Last Dose   albuterol (VENTOLIN HFA) 108 (90 Base) MCG/ACT inhaler Inhale into the lungs.   03/20/2023   aspirin EC 81 MG tablet Take 81 mg by mouth daily.   03/19/2023   atorvastatin (LIPITOR) 40 MG tablet Take by mouth.   03/19/2023   clopidogrel (PLAVIX) 75 MG tablet Take 75 mg by mouth daily.   03/19/2023   empagliflozin (JARDIANCE) 25 MG TABS tablet Take 25 mg by mouth daily.      furosemide (LASIX) 40 MG tablet Take 1.5 tablets (60 mg total)  by mouth daily. (Patient taking differently: Take 40 mg by mouth 2 (two) times daily.) 30 tablet 1 03/19/2023   guaiFENesin-dextromethorphan (ROBITUSSIN DM) 100-10 MG/5ML syrup Take 10 mLs by mouth every 6 (six) hours as needed for cough. 118 mL 1 prn at unk   Insulin Aspart FlexPen (NOVOLOG) 100 UNIT/ML Inject 64 Units into the skin in the morning and at bedtime. Before lunch and dinner   03/19/2023   insulin glargine (LANTUS) 100 UNIT/ML injection Inject 100 Units into the skin at bedtime.   03/19/2023   metFORMIN (GLUCOPHAGE) 500 MG tablet Take 1,000 mg by mouth daily with breakfast.   03/19/2023   metFORMIN (GLUCOPHAGE) 500 MG tablet Take 1,000 mg by mouth daily with supper.   03/19/2023   metoprolol succinate (TOPROL-XL) 50 MG 24 hr tablet Take 1 tablet by mouth daily.   03/19/2023   oxybutynin (DITROPAN-XL) 10 MG 24 hr tablet Take 1 tablet by mouth daily.   03/19/2023   sacubitril-valsartan (ENTRESTO) 24-26 MG Take 1 tablet by mouth every 12 (twelve) hours.   03/19/2023   spironolactone (ALDACTONE) 25 MG tablet Take by mouth.   03/19/2023   BD INSULIN SYRINGE U/F 31G X 5/16" 1 ML MISC USE AS DIRECTED ONCE DAILY WITH LEVEMIR (VIAL). (Patient not taking: Reported on 07/09/2022)      Continuous Blood Gluc Sensor (FREESTYLE LIBRE SENSOR SYSTEM) MISC Use 1 kit every 14 (fourteen) days for glucose monitoring  [facility] CLINIC CARDIOLOGY CONSULT NOTE       Patient ID: Julia Simmons MRN: [id number] DOB/AGE: [birth year] 62 y.o.  Admit date: 03/20/2023 Referring Physician Dr. Mikey College Primary Physician Dr. Maurine Minister Primary Cardiologist Dr. Darrold Junker Reason for Consultation NSTEMI  HPI: Julia Simmons is a 62 y.o. female  with a past medical history of coronary artery disease s/p DES to LAD 11/2021, ischemic cardiomyopathy, chronic HFrEF (EF 35-40% 11/2021), R MCA CVA 11/2021, hypertension, hyperlipidemia, type 2 diabetes, OSA on CPAP, morbid obesity who presented to the ED on 03/20/2023 for malaise, dysuria. Cardiology was consulted for further evaluation of NSTEMI.  Today patient details that she fells fine. No SOB. No chest pain. Resting comfortably in bed.  Past Medical History:  Diagnosis Date   Charcot's joint of foot    Coronary artery disease    Diabetes mellitus with neuropathy (HCC)    Diabetic retinopathy (HCC)    Hypertension    Morbid obesity with BMI of 50.0-59.9, adult (HCC)    Sleep apnea    Stroke [facility])     Past Surgical History:  Procedure Laterality Date   CATARACT EXTRACTION W/PHACO Right 07/18/2022   Procedure: CATARACT EXTRACTION PHACO AND INTRAOCULAR LENS PLACEMENT (IOC) RIGHT DIABETIC HEALON 5 VISION BLUE 33.79 2:31.3;  Surgeon: Lockie Mola, MD;  Location: [facility];  Service: Ophthalmology;  Laterality: Right;   CESAREAN SECTION      Medications Prior to Admission  Medication Sig Dispense Refill Last Dose   albuterol (VENTOLIN HFA) 108 (90 Base) MCG/ACT inhaler Inhale into the lungs.   03/20/2023   aspirin EC 81 MG tablet Take 81 mg by mouth daily.   03/19/2023   atorvastatin (LIPITOR) 40 MG tablet Take by mouth.   03/19/2023   clopidogrel (PLAVIX) 75 MG tablet Take 75 mg by mouth daily.   03/19/2023   empagliflozin (JARDIANCE) 25 MG TABS tablet Take 25 mg by mouth daily.      furosemide (LASIX) 40 MG tablet Take 1.5 tablets (60 mg total)  by mouth daily. (Patient taking differently: Take 40 mg by mouth 2 (two) times daily.) 30 tablet 1 03/19/2023   guaiFENesin-dextromethorphan (ROBITUSSIN DM) 100-10 MG/5ML syrup Take 10 mLs by mouth every 6 (six) hours as needed for cough. 118 mL 1 prn at unk   Insulin Aspart FlexPen (NOVOLOG) 100 UNIT/ML Inject 64 Units into the skin in the morning and at bedtime. Before lunch and dinner   03/19/2023   insulin glargine (LANTUS) 100 UNIT/ML injection Inject 100 Units into the skin at bedtime.   03/19/2023   metFORMIN (GLUCOPHAGE) 500 MG tablet Take 1,000 mg by mouth daily with breakfast.   03/19/2023   metFORMIN (GLUCOPHAGE) 500 MG tablet Take 1,000 mg by mouth daily with supper.   03/19/2023   metoprolol succinate (TOPROL-XL) 50 MG 24 hr tablet Take 1 tablet by mouth daily.   03/19/2023   oxybutynin (DITROPAN-XL) 10 MG 24 hr tablet Take 1 tablet by mouth daily.   03/19/2023   sacubitril-valsartan (ENTRESTO) 24-26 MG Take 1 tablet by mouth every 12 (twelve) hours.   03/19/2023   spironolactone (ALDACTONE) 25 MG tablet Take by mouth.   03/19/2023   BD INSULIN SYRINGE U/F 31G X 5/16" 1 ML MISC USE AS DIRECTED ONCE DAILY WITH LEVEMIR (VIAL). (Patient not taking: Reported on 07/09/2022)      Continuous Blood Gluc Sensor (FREESTYLE LIBRE SENSOR SYSTEM) MISC Use 1 kit every 14 (fourteen) days for glucose monitoring  results for input(s): "AST", "ALT", "ALKPHOS", "BILITOT", "PROT", "ALBUMIN" in the last 72 hours.  No results for input(s): "LIPASE", "AMYLASE" in the last 72 hours. CBC: Recent Labs    03/22/23 0305 03/23/23 0627  WBC 8.3 6.7  HGB 10.7* 10.1*  HCT 32.5* 31.0*  MCV 92.6 93.7  PLT 257 253   Cardiac Enzymes: Recent Labs    03/20/23 1415 03/21/23 0559  TROPONINIHS 10,699* 7,673*   BNP: No results for input(s): "BNP" in the last 72 hours.  D-Dimer: No results for input(s): "DDIMER" in the last 72 hours. Hemoglobin A1C: No results for input(s): "HGBA1C" in the last 72 hours.  Fasting Lipid Panel: No results for input(s): "CHOL", "HDL", "LDLCALC", "TRIG", "CHOLHDL", "LDLDIRECT" in the last 72 hours. Thyroid Function Tests: No results for input(s): "TSH", "T4TOTAL", "T3FREE", "THYROIDAB" in the last 72 hours.  Invalid input(s): "FREET3" Anemia Panel: No results for input(s): "VITAMINB12", "FOLATE", "FERRITIN", "TIBC", "IRON", "RETICCTPCT" in the last 72 hours.   Radiology: DG Chest 2 View  Result Date: 03/22/2023 CLINICAL DATA:  Pneumonia EXAM: CHEST - 2 VIEW COMPARISON:  03/21/2023 FINDINGS: Cardiomegaly. Mild, diffuse interstitial opacity similar to prior examination. No focal airspace opacity. The visualized skeletal structures are unremarkable. IMPRESSION: Cardiomegaly with mild, diffuse interstitial opacity similar to prior examination, consistent with edema or atypical/viral infection. No focal  airspace opacity. Electronically Signed   By: Jearld Lesch M.D.   On: 03/22/2023 12:47   DG Chest 1 View  Result Date: 03/21/2023 CLINICAL DATA:  62 year old female with history of congestive heart failure. EXAM: CHEST  1 VIEW COMPARISON:  Chest x-ray 03/20/2023. FINDINGS: Vague opacity projecting over the left hemithorax. Right lung appears clear. No right pleural effusion. No pneumothorax. No evidence of pulmonary edema. Heart size is normal. The patient is rotated to the left on today's exam, resulting in distortion of the mediastinal contours and reduced diagnostic sensitivity and specificity for mediastinal pathology. IMPRESSION: 1. Extensive airspace consolidation in the left lung compatible with pneumonia better demonstrated on prior chest CT. Overall, aeration has worsened in the left lung compared to the recent chest x-ray. Electronically Signed   By: Trudie Reed M.D.   On: 03/21/2023 07:22   ECHOCARDIOGRAM COMPLETE  Result Date: 03/20/2023    ECHOCARDIOGRAM REPORT   Patient Name:   Julia Simmons Date of Exam: 03/20/2023 Medical Rec #:  [id number]       Height:       65.0 in Accession #:    [id number]      Weight:       318.0 lb Date of Birth:  09/15/1960        BSA:          2.409 m Patient Age:    62 years        BP:           103/78 mmHg Patient Gender: F               HR:           96 bpm. Exam Location:  [facility] Procedure: 2D Echo, Color Doppler, Cardiac Doppler and Intracardiac            Opacification Agent Indications:     NSTEMI I21.4  History:         Patient has prior history of Echocardiogram examinations, most                  recent 06/17/2020. Risk Factors:Hypertension, Diabetes and  results for input(s): "AST", "ALT", "ALKPHOS", "BILITOT", "PROT", "ALBUMIN" in the last 72 hours.  No results for input(s): "LIPASE", "AMYLASE" in the last 72 hours. CBC: Recent Labs    03/22/23 0305 03/23/23 0627  WBC 8.3 6.7  HGB 10.7* 10.1*  HCT 32.5* 31.0*  MCV 92.6 93.7  PLT 257 253   Cardiac Enzymes: Recent Labs    03/20/23 1415 03/21/23 0559  TROPONINIHS 10,699* 7,673*   BNP: No results for input(s): "BNP" in the last 72 hours.  D-Dimer: No results for input(s): "DDIMER" in the last 72 hours. Hemoglobin A1C: No results for input(s): "HGBA1C" in the last 72 hours.  Fasting Lipid Panel: No results for input(s): "CHOL", "HDL", "LDLCALC", "TRIG", "CHOLHDL", "LDLDIRECT" in the last 72 hours. Thyroid Function Tests: No results for input(s): "TSH", "T4TOTAL", "T3FREE", "THYROIDAB" in the last 72 hours.  Invalid input(s): "FREET3" Anemia Panel: No results for input(s): "VITAMINB12", "FOLATE", "FERRITIN", "TIBC", "IRON", "RETICCTPCT" in the last 72 hours.   Radiology: DG Chest 2 View  Result Date: 03/22/2023 CLINICAL DATA:  Pneumonia EXAM: CHEST - 2 VIEW COMPARISON:  03/21/2023 FINDINGS: Cardiomegaly. Mild, diffuse interstitial opacity similar to prior examination. No focal airspace opacity. The visualized skeletal structures are unremarkable. IMPRESSION: Cardiomegaly with mild, diffuse interstitial opacity similar to prior examination, consistent with edema or atypical/viral infection. No focal  airspace opacity. Electronically Signed   By: Jearld Lesch M.D.   On: 03/22/2023 12:47   DG Chest 1 View  Result Date: 03/21/2023 CLINICAL DATA:  62 year old female with history of congestive heart failure. EXAM: CHEST  1 VIEW COMPARISON:  Chest x-ray 03/20/2023. FINDINGS: Vague opacity projecting over the left hemithorax. Right lung appears clear. No right pleural effusion. No pneumothorax. No evidence of pulmonary edema. Heart size is normal. The patient is rotated to the left on today's exam, resulting in distortion of the mediastinal contours and reduced diagnostic sensitivity and specificity for mediastinal pathology. IMPRESSION: 1. Extensive airspace consolidation in the left lung compatible with pneumonia better demonstrated on prior chest CT. Overall, aeration has worsened in the left lung compared to the recent chest x-ray. Electronically Signed   By: Trudie Reed M.D.   On: 03/21/2023 07:22   ECHOCARDIOGRAM COMPLETE  Result Date: 03/20/2023    ECHOCARDIOGRAM REPORT   Patient Name:   Julia Simmons Date of Exam: 03/20/2023 Medical Rec #:  [id number]       Height:       65.0 in Accession #:    [id number]      Weight:       318.0 lb Date of Birth:  09/15/1960        BSA:          2.409 m Patient Age:    62 years        BP:           103/78 mmHg Patient Gender: F               HR:           96 bpm. Exam Location:  [facility] Procedure: 2D Echo, Color Doppler, Cardiac Doppler and Intracardiac            Opacification Agent Indications:     NSTEMI I21.4  History:         Patient has prior history of Echocardiogram examinations, most                  recent 06/17/2020. Risk Factors:Hypertension, Diabetes and  [facility] CLINIC CARDIOLOGY CONSULT NOTE       Patient ID: Julia Simmons MRN: [id number] DOB/AGE: [birth year] 62 y.o.  Admit date: 03/20/2023 Referring Physician Dr. Mikey College Primary Physician Dr. Maurine Minister Primary Cardiologist Dr. Darrold Junker Reason for Consultation NSTEMI  HPI: Julia Simmons is a 62 y.o. female  with a past medical history of coronary artery disease s/p DES to LAD 11/2021, ischemic cardiomyopathy, chronic HFrEF (EF 35-40% 11/2021), R MCA CVA 11/2021, hypertension, hyperlipidemia, type 2 diabetes, OSA on CPAP, morbid obesity who presented to the ED on 03/20/2023 for malaise, dysuria. Cardiology was consulted for further evaluation of NSTEMI.  Today patient details that she fells fine. No SOB. No chest pain. Resting comfortably in bed.  Past Medical History:  Diagnosis Date   Charcot's joint of foot    Coronary artery disease    Diabetes mellitus with neuropathy (HCC)    Diabetic retinopathy (HCC)    Hypertension    Morbid obesity with BMI of 50.0-59.9, adult (HCC)    Sleep apnea    Stroke [facility])     Past Surgical History:  Procedure Laterality Date   CATARACT EXTRACTION W/PHACO Right 07/18/2022   Procedure: CATARACT EXTRACTION PHACO AND INTRAOCULAR LENS PLACEMENT (IOC) RIGHT DIABETIC HEALON 5 VISION BLUE 33.79 2:31.3;  Surgeon: Lockie Mola, MD;  Location: [facility];  Service: Ophthalmology;  Laterality: Right;   CESAREAN SECTION      Medications Prior to Admission  Medication Sig Dispense Refill Last Dose   albuterol (VENTOLIN HFA) 108 (90 Base) MCG/ACT inhaler Inhale into the lungs.   03/20/2023   aspirin EC 81 MG tablet Take 81 mg by mouth daily.   03/19/2023   atorvastatin (LIPITOR) 40 MG tablet Take by mouth.   03/19/2023   clopidogrel (PLAVIX) 75 MG tablet Take 75 mg by mouth daily.   03/19/2023   empagliflozin (JARDIANCE) 25 MG TABS tablet Take 25 mg by mouth daily.      furosemide (LASIX) 40 MG tablet Take 1.5 tablets (60 mg total)  by mouth daily. (Patient taking differently: Take 40 mg by mouth 2 (two) times daily.) 30 tablet 1 03/19/2023   guaiFENesin-dextromethorphan (ROBITUSSIN DM) 100-10 MG/5ML syrup Take 10 mLs by mouth every 6 (six) hours as needed for cough. 118 mL 1 prn at unk   Insulin Aspart FlexPen (NOVOLOG) 100 UNIT/ML Inject 64 Units into the skin in the morning and at bedtime. Before lunch and dinner   03/19/2023   insulin glargine (LANTUS) 100 UNIT/ML injection Inject 100 Units into the skin at bedtime.   03/19/2023   metFORMIN (GLUCOPHAGE) 500 MG tablet Take 1,000 mg by mouth daily with breakfast.   03/19/2023   metFORMIN (GLUCOPHAGE) 500 MG tablet Take 1,000 mg by mouth daily with supper.   03/19/2023   metoprolol succinate (TOPROL-XL) 50 MG 24 hr tablet Take 1 tablet by mouth daily.   03/19/2023   oxybutynin (DITROPAN-XL) 10 MG 24 hr tablet Take 1 tablet by mouth daily.   03/19/2023   sacubitril-valsartan (ENTRESTO) 24-26 MG Take 1 tablet by mouth every 12 (twelve) hours.   03/19/2023   spironolactone (ALDACTONE) 25 MG tablet Take by mouth.   03/19/2023   BD INSULIN SYRINGE U/F 31G X 5/16" 1 ML MISC USE AS DIRECTED ONCE DAILY WITH LEVEMIR (VIAL). (Patient not taking: Reported on 07/09/2022)      Continuous Blood Gluc Sensor (FREESTYLE LIBRE SENSOR SYSTEM) MISC Use 1 kit every 14 (fourteen) days for glucose monitoring

## 2023-03-23 NOTE — Progress Notes (Signed)
PHARMACY - ANTICOAGULATION CONSULT NOTE  Pharmacy Consult for Heparin Infusion Indication: chest pain/ACS  Allergies  Allergen Reactions   Hydrochlorothiazide Swelling    Facial swelling   Sulfa Antibiotics Swelling    Facial swelling    Patient Measurements: Height: 5\' 5"  (165.1 cm) Weight: (!) 145.7 kg (321 lb 3.4 oz) IBW/kg (Calculated) : 57 Heparin Dosing Weight: 93.1 kg  Vital Signs: Temp: 98.4 F (36.9 C) (10/05 0510) Temp Source: Oral (10/05 0510) BP: 123/62 (10/05 0510) Pulse Rate: 71 (10/05 0510)  Labs: Recent Labs    03/20/23 0822 03/20/23 1415 03/20/23 1635 03/21/23 0559 03/21/23 1433 03/22/23 0305 03/23/23 0627  HGB  --   --    < > 11.2*  --  10.7* 10.1*  HCT  --   --   --  34.8*  --  32.5* 31.0*  PLT  --   --   --  262  --  257 253  APTT 25  --   --   --   --   --   --   LABPROT 14.1  --   --   --   --   --   --   INR 1.1  --   --   --   --   --   --   HEPARINUNFRC  --  <0.10*   < > 0.22* 0.46 0.40 0.33  CREATININE  --   --   --  1.56*  --  1.25*  --   TROPONINIHS 2,906* 10,699*  --  4,098*  --   --   --    < > = values in this interval not displayed.    Estimated Creatinine Clearance: 69.9 mL/min (A) (by C-G formula based on SCr of 1.25 mg/dL (H)).   Medical History: Past Medical History:  Diagnosis Date   Charcot's joint of foot    Coronary artery disease    Diabetes mellitus with neuropathy (HCC)    Diabetic retinopathy (HCC)    Hypertension    Morbid obesity with BMI of 50.0-59.9, adult (HCC)    Sleep apnea    Stroke [facility])      Assessment: Patient is a 62 year old female with a past medical history of CAD with LAD stenting x 2 in 2023, chronic HFrEF and HFpEF with LVEF 35-40% in 2023, HTN, IDDM with insulin resistance, HLD, morbid obesity, OSA on CPAP at bedtime, CKD stage II, and frequent UTIs who presented with malaise, dysuria, and chills. Pharmacy was consulted to initiate patient on a heparin infusion for ACS/chest pain. Patient  was not on anticoagulation prior to admission, therefore it is appropriate to monitor via heparin levels.  10/2 1415 HL < 0.10 10/02 2238 HL 0.26, subtherapeutic 10/3 0559 HL 0.22  10/3 1433 HL 0.46 10/4 0305 HL 0.4 10/5 0627 HL 0.33 Therapeutic  Goal of Therapy:  Heparin level 0.3-0.7 units/ml Monitor platelets by anticoagulation protocol: Yes   Plan:  10/5 0627 HL 0.33  Heparin level is therapeutic. Will continue heparin at 1900 units/hr. Recheck heparin level and CBC daily while on heparin.   Bari Mantis PharmD Clinical Pharmacist 03/23/2023

## 2023-03-23 NOTE — Progress Notes (Signed)
PULMONOLOGY         Date: 03/23/2023,   MRN# [id number] Julia Simmons 06/18/1961     AdmissionWeight: (!) 144.2 kg                 CurrentWeight: (!) 145.7 kg  Referring provider: Dr Chipper Herb   CHIEF COMPLAINT:   Multifocal pneumonia with E.coli bacteremia   HISTORY OF PRESENT ILLNESS   This is 62 year old morbidly obese female with a BMI of over 50 who has diabetes with chronic neuropathy, retinopathy and Charcot's foot, severe coronary artery disease status post 2 stents and reports having previous CVA and congestive heart failure, also background history of sleep apnea who came in with shortness of breath cough and chills x 2 days.  She was found to have multifocal pneumonia worse on the left on CT chest imaging with PE protocol.  She did have microbiology including blood culture showing E. coli bacteremia.  Patient is on room air during my evaluation.  She does report acid reflux but denies having aspiration events and denies vomiting.  03/22/23- patient with no acute events overnight.  Net negative 1.5 L and with 2L/min [place]. BMP with improved renal function.   03/23/23- patient feeling better this am, husband at bedside.  She's on 1.5-2L/min in no distress.  She's ambulatory in room going to bathroom.  She is net 1.6L negative.  Has cardiac workup in progess.  Remains on zithromax rocephin.   PAST MEDICAL HISTORY   Past Medical History:  Diagnosis Date   Charcot's joint of foot    Coronary artery disease    Diabetes mellitus with neuropathy (HCC)    Diabetic retinopathy (HCC)    Hypertension    Morbid obesity with BMI of 50.0-59.9, adult (HCC)    Sleep apnea    Stroke [facility])      SURGICAL HISTORY   Past Surgical History:  Procedure Laterality Date   CATARACT EXTRACTION W/PHACO Right 07/18/2022   Procedure: CATARACT EXTRACTION PHACO AND INTRAOCULAR LENS PLACEMENT (IOC) RIGHT DIABETIC HEALON 5 VISION BLUE 33.79 2:31.3;  Surgeon: Lockie Mola, MD;   Location: [facility];  Service: Ophthalmology;  Laterality: Right;   CESAREAN SECTION       FAMILY HISTORY   Family History  Problem Relation Age of Onset   Breast cancer Neg Hx      SOCIAL HISTORY   Social History   Tobacco Use   Smoking status: Never   Smokeless tobacco: Never  Vaping Use   Vaping status: Never Used  Substance Use Topics   Alcohol use: Not Currently   Drug use: Never     MEDICATIONS    Home Medication:    Current Medication:  Current Facility-Administered Medications:    acetaminophen (TYLENOL) tablet 650 mg, 650 mg, Oral, Q6H PRN, Mikey College T, MD, 650 mg at 03/21/23 0039   albuterol (PROVENTIL) (2.5 MG/3ML) 0.083% nebulizer solution 3 mL, 3 mL, Nebulization, Q4H PRN, Mikey College T, MD   aspirin EC tablet 81 mg, 81 mg, Oral, Daily, Chipper Herb, Ping T, MD, 81 mg at 03/22/23 1034   atorvastatin (LIPITOR) tablet 40 mg, 40 mg, Oral, Daily, Mikey College T, MD, 40 mg at 03/22/23 1033   azithromycin (ZITHROMAX) tablet 500 mg, 500 mg, Oral, Daily, Mikey College T, MD, 500 mg at 03/22/23 1034   bacitracin ointment, , Topical, BID, Marrion Coy, MD, Given at 03/22/23 2213   budesonide (PULMICORT) nebulizer solution 2 mg, 2 mg, Nebulization, Q12H, Mikey College  T, MD, 2 mg at 03/23/23 8119   cefTRIAXone (ROCEPHIN) 2 g in sodium chloride 0.9 % 100 mL IVPB, 2 g, Intravenous, Q24H, Marrion Coy, MD, Last Rate: 200 mL/hr at 03/22/23 1101, 2 g at 03/22/23 1101   clopidogrel (PLAVIX) tablet 75 mg, 75 mg, Oral, Daily, Mikey College T, MD, 75 mg at 03/22/23 1034   furosemide (LASIX) tablet 40 mg, 40 mg, Oral, BID, Hudson, Caralyn, PA-C, 40 mg at 03/22/23 1830   guaiFENesin-dextromethorphan (ROBITUSSIN DM) 100-10 MG/5ML syrup 10 mL, 10 mL, Oral, Q6H PRN, Mikey College T, MD   heparin ADULT infusion 100 units/mL (25000 units/234mL), 1,900 Units/hr, Intravenous, Continuous, Ronnald Ramp, RPH, Last Rate: 19 mL/hr at 03/23/23 0615, 1,900 Units/hr at 03/23/23 0615   insulin  aspart (novoLOG) injection 0-20 Units, 0-20 Units, Subcutaneous, TID WC, Emeline General, MD, 15 Units at 03/22/23 1831   insulin aspart (novoLOG) injection 0-5 Units, 0-5 Units, Subcutaneous, QHS, Mikey College T, MD, 4 Units at 03/22/23 2213   insulin glargine-yfgn (SEMGLEE) injection 18 Units, 18 Units, Subcutaneous, Daily, Marrion Coy, MD   ipratropium-albuterol (DUONEB) 0.5-2.5 (3) MG/3ML nebulizer solution 3 mL, 3 mL, Nebulization, BID, Marrion Coy, MD, 3 mL at 03/23/23 1478   linagliptin (TRADJENTA) tablet 5 mg, 5 mg, Oral, Daily, Mikey College T, MD, 5 mg at 03/22/23 1033   metoprolol succinate (TOPROL-XL) 24 hr tablet 25 mg, 25 mg, Oral, BID, Hudson, Caralyn, PA-C, 25 mg at 03/22/23 2214   ondansetron (ZOFRAN) injection 4 mg, 4 mg, Intravenous, Q6H PRN, Mikey College T, MD, 4 mg at 03/21/23 1737   oxybutynin (DITROPAN-XL) 24 hr tablet 10 mg, 10 mg, Oral, Daily, Chipper Herb, Ping T, MD, 10 mg at 03/22/23 1033    ALLERGIES   Hydrochlorothiazide and Sulfa antibiotics     REVIEW OF SYSTEMS    Review of Systems:  Gen:  Denies  fever, sweats, chills weigh loss  HEENT: Denies blurred vision, double vision, ear pain, eye pain, hearing loss, nose bleeds, sore throat Cardiac:  No dizziness, chest pain or heaviness, chest tightness,edema Resp:   reports dyspnea chronically  Gi: Denies swallowing difficulty, stomach pain, nausea or vomiting, diarrhea, constipation, bowel incontinence Gu:  Denies bladder incontinence, burning urine Ext:   Denies Joint pain, stiffness or swelling Skin: Denies  skin rash, easy bruising or bleeding or hives Endoc:  Denies polyuria, polydipsia , polyphagia or weight change Psych:   Denies depression, insomnia or hallucinations   Other:  All other systems negative   VS: BP (!) 121/59 (BP Location: Right Arm)   Pulse 63   Temp 98.5 F (36.9 C) (Oral)   Resp 16   Ht 5\' 5"  (1.651 m)   Wt (!) 145.7 kg   SpO2 93%   BMI 53.45 kg/m      PHYSICAL EXAM     GENERAL:NAD, no fevers, chills, no weakness no fatigue HEAD: Normocephalic, atraumatic.  EYES: Pupils equal, round, reactive to light. Extraocular muscles intact. No scleral icterus.  MOUTH: Moist mucosal membrane. Dentition intact. No abscess noted.  EAR, NOSE, THROAT: Clear without exudates. No external lesions.  NECK: Supple. No thyromegaly. No nodules. No JVD.  PULMONARY: decreased breath sounds with mild rhonchi worse at bases bilaterally.  CARDIOVASCULAR: S1 and S2. Regular rate and rhythm. No murmurs, rubs, or gallops. No edema. Pedal pulses 2+ bilaterally.  GASTROINTESTINAL: Soft, nontender, nondistended. No masses. Positive bowel sounds. No hepatosplenomegaly.  MUSCULOSKELETAL: No swelling, clubbing, or edema. Range of motion full in all extremities.  NEUROLOGIC:  Cranial nerves II through XII are intact. No gross focal neurological deficits. Sensation intact. Reflexes intact.  SKIN: No ulceration, lesions, rashes, or cyanosis. Skin warm and dry. Turgor intact.  PSYCHIATRIC: Mood, affect within normal limits. The patient is awake, alert and oriented x 3. Insight, judgment intact.       IMAGING   \  ASSESSMENT/PLAN   Multifocal pneumonia -suspecti due to e.coli - present on admission  - COVID19 negative  - supplemental O2 during my evaluation room air - will perform infectious workup for pneumonia -Respiratory viral panel -serum fungitell -legionella ab -strep pneumoniae ur AG -Histoplasma Ur Ag -sputum resp cultures -reviewed pertinent imaging with patient today -PT/OT for d/c planning  -please encourage patient to use incentive spirometer few times each hour while hospitalized.       Ecoli bacteremia   - patient without shock physiology     - consider ID consultation     - continue antibiotics , currently on zithromax and rocephin     OSA    Continue home CPAP with settings as per RT   - orders placed    Thank you for allowing me to participate in  the care of this patient.   Patient/Family are satisfied with care plan and all questions have been answered.    Provider disclosure: Patient with at least one acute or chronic illness or injury that poses a threat to life or bodily function and is being managed actively during this encounter.  All of the below services have been performed independently by signing provider:  review of prior documentation from internal and or external health records.  Review of previous and current lab results.  Interview and comprehensive assessment during patient visit today. Review of current and previous chest radiographs/CT scans. Discussion of management and test interpretation with health care team and patient/family.   This document was prepared using Dragon voice recognition software and may include unintentional dictation errors.     Vida Rigger, M.D.  Division of Pulmonary & Critical Care Medicine

## 2023-03-24 DIAGNOSIS — J9601 Acute respiratory failure with hypoxia: Secondary | ICD-10-CM | POA: Diagnosis not present

## 2023-03-24 DIAGNOSIS — I214 Non-ST elevation (NSTEMI) myocardial infarction: Secondary | ICD-10-CM | POA: Diagnosis not present

## 2023-03-24 DIAGNOSIS — A4151 Sepsis due to Escherichia coli [E. coli]: Secondary | ICD-10-CM | POA: Diagnosis not present

## 2023-03-24 DIAGNOSIS — A419 Sepsis, unspecified organism: Secondary | ICD-10-CM | POA: Diagnosis not present

## 2023-03-24 LAB — CBC
HCT: 31.5 % — ABNORMAL LOW (ref 36.0–46.0)
Hemoglobin: 10.4 g/dL — ABNORMAL LOW (ref 12.0–15.0)
MCH: 30.8 pg (ref 26.0–34.0)
MCHC: 33 g/dL (ref 30.0–36.0)
MCV: 93.2 fL (ref 80.0–100.0)
Platelets: 270 10*3/uL (ref 150–400)
RBC: 3.38 MIL/uL — ABNORMAL LOW (ref 3.87–5.11)
RDW: 13.6 % (ref 11.5–15.5)
WBC: 6.3 10*3/uL (ref 4.0–10.5)
nRBC: 0 % (ref 0.0–0.2)

## 2023-03-24 LAB — BASIC METABOLIC PANEL
Anion gap: 14 (ref 5–15)
BUN: 31 mg/dL — ABNORMAL HIGH (ref 6–20)
CO2: 19 mmol/L — ABNORMAL LOW (ref 22–32)
Calcium: 8.5 mg/dL — ABNORMAL LOW (ref 8.9–10.3)
Chloride: 102 mmol/L (ref 98–111)
Creatinine, Ser: 1.3 mg/dL — ABNORMAL HIGH (ref 0.44–1.00)
GFR, Estimated: 47 mL/min — ABNORMAL LOW (ref >60)
Glucose, Bld: 262 mg/dL — ABNORMAL HIGH (ref 70–99)
Potassium: 4.4 mmol/L (ref 3.5–5.1)
Sodium: 135 mmol/L (ref 135–145)

## 2023-03-24 LAB — GLUCOSE, CAPILLARY
Glucose-Capillary: 235 mg/dL — ABNORMAL HIGH (ref 70–99)
Glucose-Capillary: 278 mg/dL — ABNORMAL HIGH (ref 70–99)
Glucose-Capillary: 367 mg/dL — ABNORMAL HIGH (ref 70–99)
Glucose-Capillary: 257 mg/dL — ABNORMAL HIGH (ref 70–99)

## 2023-03-24 LAB — HEPARIN LEVEL (UNFRACTIONATED): Heparin Unfractionated: <0.1 [IU]/mL — ABNORMAL LOW (ref 0.30–0.70)

## 2023-03-24 MED ORDER — INSULIN GLARGINE-YFGN 100 UNIT/ML ~~LOC~~ SOLN: 24.0000 [IU] | Freq: Every day | SUBCUTANEOUS | Status: DC

## 2023-03-24 MED ORDER — INSULIN GLARGINE-YFGN 100 UNIT/ML ~~LOC~~ SOLN
60.0000 [IU] | Freq: Every day | SUBCUTANEOUS | Status: DC
  Administered 2023-03-25: 60 [IU] via SUBCUTANEOUS
  Filled 2023-03-24: qty 0.6

## 2023-03-24 MED ORDER — SODIUM BICARBONATE 650 MG PO TABS
650.0000 mg | ORAL_TABLET | Freq: Two times a day (BID) | ORAL | Status: DC
  Administered 2023-03-24 – 2023-03-25 (×3): 650 mg via ORAL
  Filled 2023-03-24 (×3): qty 1

## 2023-03-24 MED ORDER — BUDESONIDE 0.5 MG/2ML IN SUSP
0.5000 mg | Freq: Two times a day (BID) | RESPIRATORY_TRACT | Status: DC
  Administered 2023-03-24 – 2023-03-27 (×6): 0.5 mg via RESPIRATORY_TRACT
  Filled 2023-03-24 (×3): qty 2

## 2023-03-24 MED ORDER — SODIUM CHLORIDE 0.9% FLUSH
3.0000 mL | Freq: Two times a day (BID) | INTRAVENOUS | Status: DC
  Administered 2023-03-24 – 2023-03-27 (×6): 3 mL via INTRAVENOUS

## 2023-03-24 MED ORDER — SACUBITRIL-VALSARTAN 24-26 MG PO TABS
1.0000 | ORAL_TABLET | Freq: Two times a day (BID) | ORAL | Status: DC
  Administered 2023-03-24 – 2023-03-26 (×6): 1 via ORAL
  Filled 2023-03-24 (×6): qty 1

## 2023-03-24 NOTE — Plan of Care (Signed)
  Problem: Education: Goal: Knowledge of disease or condition will improve Outcome: Progressing   Problem: Activity: Goal: Ability to tolerate increased activity will improve Outcome: Progressing   Problem: Respiratory: Goal: Ability to maintain a clear airway will improve Outcome: Progressing Goal: Levels of oxygenation will improve Outcome: Progressing Goal: Ability to maintain adequate ventilation will improve Outcome: Progressing   Problem: Activity: Goal: Ability to tolerate increased activity will improve Outcome: Progressing   Problem: Health Behavior/Discharge Planning: Goal: Ability to identify and utilize available resources and services will improve Outcome: Progressing Goal: Ability to manage health-related needs will improve Outcome: Progressing   Problem: Metabolic: Goal: Ability to maintain appropriate glucose levels will improve Outcome: Progressing   Problem: Nutritional: Goal: Maintenance of adequate nutrition will improve Outcome: Progressing Goal: Progress toward achieving an optimal weight will improve Outcome: Progressing   Problem: Education: Goal: Understanding of CV disease, CV risk reduction, and recovery process will improve Outcome: Progressing   Problem: Activity: Goal: Ability to return to baseline activity level will improve Outcome: Progressing   Problem: Cardiovascular: Goal: Ability to achieve and maintain adequate cardiovascular perfusion will improve Outcome: Progressing   Problem: Health Behavior/Discharge Planning: Goal: Ability to safely manage health-related needs after discharge will improve Outcome: Progressing

## 2023-03-24 NOTE — Progress Notes (Signed)
Progress Note   Patient: Julia Simmons SAY:[id number] DOB: [birth year] DOA: 03/20/2023     4 DOS: the patient was seen and examined on 03/24/2023   Brief hospital course: MIGNA HOUDEK is a 62 y.o. female with medical history significant of CAD with LAD stenting x 2 in 2023, chronic HFrEF and HFpEF with LVEF 35-40% in 2023, HTN, IDDM with insulin resistance, HLD, morbid obesity, OSA on CPAP at bedtime, CKD stage II, frequent UTIs, presented with malaise, dysuria, chills.  Patient also has significant short of breath, hypoxemia of 85%, she was hypotensive at time of admission, blood pressure 76/37, oxygen saturation 85% on room air.  Patient received IV fluid being bolus in the emergency room.   Chest CT scan showed a large pneumonia involving the entire left lung. Blood culture also positive for E. coli. Patient is treated with Rocephin and Zithromax.    Principal Problem:   Severe sepsis (HCC) Active Problems:   PNA (pneumonia)   Diabetes mellitus with neuropathy (HCC)   Hypertension   Morbid obesity with BMI of 50.0-59.9, adult (HCC)   Sepsis secondary to UTI [facility])   CAD S/P percutaneous coronary angioplasty   NSTEMI (non-ST elevated myocardial infarction) (HCC)   E. coli septicemia (HCC)   Chronic systolic CHF (congestive heart failure) (HCC)   Acute hypoxemic respiratory failure (HCC)   E coli bacteremia   Assessment and Plan:  Acute respiratory failure with hypoxemia. Left lung pneumonia. Personally reviewed patient chest CT head scan images, patient has extensive pneumonia involving the entire left lung.  This appears to be bacterial, procalcitonin level significantly increased.  Patient developed significant respiratory distress and hypoxemia. Patient does not have significant dysphagia, she vomited the day prior. Repeated chest x-ray 10/4 showed significant improvement in the left lung pneumonia.  Continue Rocephin, completed Zithromax. Condition continued to improve,  currently on 2 L oxygen, anticipating wean off oxygen before discharge.   Severe sepsis secondary to pneumonia. E. coli septicemia. Possible vegetation in aortic valve. E. coli UTI and pyelonephritis. Patient met sepsis criteria with significant tachycardia and tachypnea, additionally, patient has elevated lactic acid, elevated troponin, significant hypotension. Patient blood culture was positive for E. coli, echocardiogram showed possible small vegetation in the aortic valve.  Cardiology is seeing the patient. Source of bacteremia could be from urinary, CT abdomen/pelvis showed evidence of pyelonephritis.  Urine culture also showed E. coli. Continue high-dose Rocephin. CT scan also suspect ureter abnormality with possibility of malignancy.  Outpatient follow-up with urology. Discussed with cardiology, TEE will be performed on Monday to determine the length of antibiotics.  Patient clinically improved.   Non-STEMI. Chronic systolic congestive heart failure. Patient troponin is more than 10,000. Discussed with cardiology, patient will be treated with aspirin and Plavix.  Planning heart cath on Tuesday.  Repeat blood cultures so far negative.   Uncontrolled type 2 diabetes with hyperglycemia. Glucose still running high, increase insulin glargine to 24 units daily.  Continue high-dose sliding scale insulin.   Chronic kidney disease stage IIIb. Metabolic acidosis. Patient renal function appears to be stable at this time. Added sodium bicarb.   Morbid obesity. Obstructive sleep apnea. CPAP while asleep.              Subjective:  Patient feels much better today, on 2 L oxygen.  Short of breath improved.  No cough.  Physical Exam: Vitals:   03/24/23 0822 03/24/23 0900 03/24/23 0902 03/24/23 0910  BP:      Pulse: 64  Resp: 18     Temp:      TempSrc:      SpO2: 94% (!) 85% 90% 94%  Weight:      Height:       General exam: Appears calm and comfortable, morbid  obese. Respiratory system: Clear to auscultation. Respiratory effort normal. Cardiovascular system: S1 & S2 heard, RRR. No JVD, murmurs, rubs, gallops or clicks. No pedal edema. Gastrointestinal system: Abdomen is nondistended, soft and nontender. No organomegaly or masses felt. Normal bowel sounds heard. Central nervous system: Alert and oriented. No focal neurological deficits. Extremities: Symmetric 5 x 5 power. Skin: No rashes, lesions or ulcers Psychiatry: Judgement and insight appear normal. Mood & affect appropriate.    Data Reviewed:  Lab results reviewed.  Family Communication: Husband updated at bedside.  Disposition: Status is: Inpatient Remains inpatient appropriate because: Severity of disease, IV treatment.     Time spent: 35 minutes  Author: Marrion Coy, MD 03/24/2023 10:43 AM  For on call review [email or web address].

## 2023-03-24 NOTE — Plan of Care (Signed)
  Problem: Activity: Goal: Ability to tolerate increased activity will improve Outcome: Progressing   Problem: Respiratory: Goal: Ability to maintain adequate ventilation will improve Outcome: Progressing   Problem: Activity: Goal: Ability to tolerate increased activity will improve Outcome: Progressing   Problem: Coping: Goal: Ability to adjust to condition or change in health will improve Outcome: Progressing   Problem: Clinical Measurements: Goal: Respiratory complications will improve Outcome: Progressing

## 2023-03-24 NOTE — Progress Notes (Signed)
[facility] CARDIOLOGY CONSULT NOTE       Patient ID: Julia Simmons MRN: [id number] DOB/AGE: [birth year] 62 y.o.  Admit date: 03/20/2023 Referring Physician Dr. Mikey College Primary Physician Dr. Maurine Minister Primary Cardiologist Dr. Darrold Junker Reason for Consultation NSTEMI  HPI: Julia Simmons is a 62 y.o. female  with a past medical history of coronary artery disease s/p DES to LAD 11/2021, ischemic cardiomyopathy, chronic HFrEF (EF 35-40% 11/2021), R MCA CVA 11/2021, hypertension, hyperlipidemia, type 2 diabetes, OSA on CPAP, morbid obesity who presented to the ED on 03/20/2023 for malaise, dysuria. Cardiology was consulted for further evaluation of NSTEMI.  Today patient details that she fells fine. No SOB. No chest pain. Resting comfortably in bed.  Past Medical History:  Diagnosis Date   Charcot's joint of foot    Coronary artery disease    Diabetes mellitus with neuropathy (HCC)    Diabetic retinopathy (HCC)    Hypertension    Morbid obesity with BMI of 50.0-59.9, adult (HCC)    Sleep apnea    Stroke [facility])     Past Surgical History:  Procedure Laterality Date   CATARACT EXTRACTION W/PHACO Right 07/18/2022   Procedure: CATARACT EXTRACTION PHACO AND INTRAOCULAR LENS PLACEMENT (IOC) RIGHT DIABETIC HEALON 5 VISION BLUE 33.79 2:31.3;  Surgeon: Lockie Mola, MD;  Location: [facility];  Service: Ophthalmology;  Laterality: Right;   CESAREAN SECTION      Medications Prior to Admission  Medication Sig Dispense Refill Last Dose   albuterol (VENTOLIN HFA) 108 (90 Base) MCG/ACT inhaler Inhale into the lungs.   03/20/2023   aspirin EC 81 MG tablet Take 81 mg by mouth daily.   03/19/2023   atorvastatin (LIPITOR) 40 MG tablet Take by mouth.   03/19/2023   clopidogrel (PLAVIX) 75 MG tablet Take 75 mg by mouth daily.   03/19/2023   empagliflozin (JARDIANCE) 25 MG TABS tablet Take 25 mg by mouth daily.      furosemide (LASIX) 40 MG tablet Take 1.5 tablets (60 mg total)  by mouth daily. (Patient taking differently: Take 40 mg by mouth 2 (two) times daily.) 30 tablet 1 03/19/2023   guaiFENesin-dextromethorphan (ROBITUSSIN DM) 100-10 MG/5ML syrup Take 10 mLs by mouth every 6 (six) hours as needed for cough. 118 mL 1 prn at unk   Insulin Aspart FlexPen (NOVOLOG) 100 UNIT/ML Inject 64 Units into the skin in the morning and at bedtime. Before lunch and dinner   03/19/2023   insulin glargine (LANTUS) 100 UNIT/ML injection Inject 100 Units into the skin at bedtime.   03/19/2023   metFORMIN (GLUCOPHAGE) 500 MG tablet Take 1,000 mg by mouth daily with breakfast.   03/19/2023   metFORMIN (GLUCOPHAGE) 500 MG tablet Take 1,000 mg by mouth daily with supper.   03/19/2023   metoprolol succinate (TOPROL-XL) 50 MG 24 hr tablet Take 1 tablet by mouth daily.   03/19/2023   oxybutynin (DITROPAN-XL) 10 MG 24 hr tablet Take 1 tablet by mouth daily.   03/19/2023   sacubitril-valsartan (ENTRESTO) 24-26 MG Take 1 tablet by mouth every 12 (twelve) hours.   03/19/2023   spironolactone (ALDACTONE) 25 MG tablet Take by mouth.   03/19/2023   BD INSULIN SYRINGE U/F 31G X 5/16" 1 ML MISC USE AS DIRECTED ONCE DAILY WITH LEVEMIR (VIAL). (Patient not taking: Reported on 07/09/2022)      Continuous Blood Gluc Sensor (FREESTYLE LIBRE SENSOR SYSTEM) MISC Use 1 kit every 14 (fourteen) days for glucose monitoring  [facility] CARDIOLOGY CONSULT NOTE       Patient ID: Julia Simmons MRN: [id number] DOB/AGE: [birth year] 62 y.o.  Admit date: 03/20/2023 Referring Physician Dr. Mikey College Primary Physician Dr. Maurine Minister Primary Cardiologist Dr. Darrold Junker Reason for Consultation NSTEMI  HPI: Julia Simmons is a 62 y.o. female  with a past medical history of coronary artery disease s/p DES to LAD 11/2021, ischemic cardiomyopathy, chronic HFrEF (EF 35-40% 11/2021), R MCA CVA 11/2021, hypertension, hyperlipidemia, type 2 diabetes, OSA on CPAP, morbid obesity who presented to the ED on 03/20/2023 for malaise, dysuria. Cardiology was consulted for further evaluation of NSTEMI.  Today patient details that she fells fine. No SOB. No chest pain. Resting comfortably in bed.  Past Medical History:  Diagnosis Date   Charcot's joint of foot    Coronary artery disease    Diabetes mellitus with neuropathy (HCC)    Diabetic retinopathy (HCC)    Hypertension    Morbid obesity with BMI of 50.0-59.9, adult (HCC)    Sleep apnea    Stroke [facility])     Past Surgical History:  Procedure Laterality Date   CATARACT EXTRACTION W/PHACO Right 07/18/2022   Procedure: CATARACT EXTRACTION PHACO AND INTRAOCULAR LENS PLACEMENT (IOC) RIGHT DIABETIC HEALON 5 VISION BLUE 33.79 2:31.3;  Surgeon: Lockie Mola, MD;  Location: [facility];  Service: Ophthalmology;  Laterality: Right;   CESAREAN SECTION      Medications Prior to Admission  Medication Sig Dispense Refill Last Dose   albuterol (VENTOLIN HFA) 108 (90 Base) MCG/ACT inhaler Inhale into the lungs.   03/20/2023   aspirin EC 81 MG tablet Take 81 mg by mouth daily.   03/19/2023   atorvastatin (LIPITOR) 40 MG tablet Take by mouth.   03/19/2023   clopidogrel (PLAVIX) 75 MG tablet Take 75 mg by mouth daily.   03/19/2023   empagliflozin (JARDIANCE) 25 MG TABS tablet Take 25 mg by mouth daily.      furosemide (LASIX) 40 MG tablet Take 1.5 tablets (60 mg total)  by mouth daily. (Patient taking differently: Take 40 mg by mouth 2 (two) times daily.) 30 tablet 1 03/19/2023   guaiFENesin-dextromethorphan (ROBITUSSIN DM) 100-10 MG/5ML syrup Take 10 mLs by mouth every 6 (six) hours as needed for cough. 118 mL 1 prn at unk   Insulin Aspart FlexPen (NOVOLOG) 100 UNIT/ML Inject 64 Units into the skin in the morning and at bedtime. Before lunch and dinner   03/19/2023   insulin glargine (LANTUS) 100 UNIT/ML injection Inject 100 Units into the skin at bedtime.   03/19/2023   metFORMIN (GLUCOPHAGE) 500 MG tablet Take 1,000 mg by mouth daily with breakfast.   03/19/2023   metFORMIN (GLUCOPHAGE) 500 MG tablet Take 1,000 mg by mouth daily with supper.   03/19/2023   metoprolol succinate (TOPROL-XL) 50 MG 24 hr tablet Take 1 tablet by mouth daily.   03/19/2023   oxybutynin (DITROPAN-XL) 10 MG 24 hr tablet Take 1 tablet by mouth daily.   03/19/2023   sacubitril-valsartan (ENTRESTO) 24-26 MG Take 1 tablet by mouth every 12 (twelve) hours.   03/19/2023   spironolactone (ALDACTONE) 25 MG tablet Take by mouth.   03/19/2023   BD INSULIN SYRINGE U/F 31G X 5/16" 1 ML MISC USE AS DIRECTED ONCE DAILY WITH LEVEMIR (VIAL). (Patient not taking: Reported on 07/09/2022)      Continuous Blood Gluc Sensor (FREESTYLE LIBRE SENSOR SYSTEM) MISC Use 1 kit every 14 (fourteen) days for glucose monitoring  72 hours.  No results for input(s): "LIPASE", "AMYLASE" in the last 72 hours. CBC: Recent Labs    03/23/23 0627 03/24/23 0401  WBC 6.7 6.3  HGB 10.1* 10.4*  HCT 31.0* 31.5*  MCV 93.7 93.2  PLT 253 270   Cardiac Enzymes: No results for input(s): "CKTOTAL", "CKMB", "CKMBINDEX", "TROPONINIHS" in the last 72 hours.  BNP: No results for input(s): "BNP" in the last 72 hours.  D-Dimer: No results for input(s): "DDIMER" in the last 72 hours. Hemoglobin A1C: No results for input(s): "HGBA1C" in the last 72 hours.  Fasting Lipid Panel: No results for input(s): "CHOL", "HDL", "LDLCALC", "TRIG", "CHOLHDL", "LDLDIRECT" in the last 72 hours. Thyroid Function Tests: No results for input(s): "TSH", "T4TOTAL", "T3FREE", "THYROIDAB" in the last 72 hours.  Invalid input(s): "FREET3" Anemia Panel: No results for input(s): "VITAMINB12", "FOLATE", "FERRITIN", "TIBC", "IRON", "RETICCTPCT" in the last 72 hours.   Radiology: DG Chest 2 View  Result Date: 03/22/2023 CLINICAL DATA:  Pneumonia EXAM: CHEST - 2 VIEW COMPARISON:  03/21/2023 FINDINGS: Cardiomegaly. Mild, diffuse interstitial opacity similar to prior examination. No focal airspace opacity. The visualized skeletal structures are unremarkable. IMPRESSION: Cardiomegaly with mild, diffuse interstitial opacity similar to prior examination, consistent with edema or atypical/viral infection. No focal airspace opacity. Electronically Signed   By: Jearld Lesch M.D.   On: 03/22/2023 12:47   DG  Chest 1 View  Result Date: 03/21/2023 CLINICAL DATA:  62 year old female with history of congestive heart failure. EXAM: CHEST  1 VIEW COMPARISON:  Chest x-ray 03/20/2023. FINDINGS: Vague opacity projecting over the left hemithorax. Right lung appears clear. No right pleural effusion. No pneumothorax. No evidence of pulmonary edema. Heart size is normal. The patient is rotated to the left on today's exam, resulting in distortion of the mediastinal contours and reduced diagnostic sensitivity and specificity for mediastinal pathology. IMPRESSION: 1. Extensive airspace consolidation in the left lung compatible with pneumonia better demonstrated on prior chest CT. Overall, aeration has worsened in the left lung compared to the recent chest x-ray. Electronically Signed   By: Trudie Reed M.D.   On: 03/21/2023 07:22   ECHOCARDIOGRAM COMPLETE  Result Date: 03/20/2023    ECHOCARDIOGRAM REPORT   Patient Name:   Julia Simmons Date of Exam: 03/20/2023 Medical Rec #:  [id number]       Height:       65.0 in Accession #:    [id number]      Weight:       318.0 lb Date of Birth:  14-Feb-1961        BSA:          2.409 m Patient Age:    62 years        BP:           103/78 mmHg Patient Gender: F               HR:           96 bpm. Exam Location:  [facility] Procedure: 2D Echo, Color Doppler, Cardiac Doppler and Intracardiac            Opacification Agent Indications:     NSTEMI I21.4  History:         Patient has prior history of Echocardiogram examinations, most                  recent 06/17/2020. Risk Factors:Hypertension, Diabetes and  [facility] CARDIOLOGY CONSULT NOTE       Patient ID: Julia Simmons MRN: [id number] DOB/AGE: [birth year] 62 y.o.  Admit date: 03/20/2023 Referring Physician Dr. Mikey College Primary Physician Dr. Maurine Minister Primary Cardiologist Dr. Darrold Junker Reason for Consultation NSTEMI  HPI: Julia Simmons is a 62 y.o. female  with a past medical history of coronary artery disease s/p DES to LAD 11/2021, ischemic cardiomyopathy, chronic HFrEF (EF 35-40% 11/2021), R MCA CVA 11/2021, hypertension, hyperlipidemia, type 2 diabetes, OSA on CPAP, morbid obesity who presented to the ED on 03/20/2023 for malaise, dysuria. Cardiology was consulted for further evaluation of NSTEMI.  Today patient details that she fells fine. No SOB. No chest pain. Resting comfortably in bed.  Past Medical History:  Diagnosis Date   Charcot's joint of foot    Coronary artery disease    Diabetes mellitus with neuropathy (HCC)    Diabetic retinopathy (HCC)    Hypertension    Morbid obesity with BMI of 50.0-59.9, adult (HCC)    Sleep apnea    Stroke [facility])     Past Surgical History:  Procedure Laterality Date   CATARACT EXTRACTION W/PHACO Right 07/18/2022   Procedure: CATARACT EXTRACTION PHACO AND INTRAOCULAR LENS PLACEMENT (IOC) RIGHT DIABETIC HEALON 5 VISION BLUE 33.79 2:31.3;  Surgeon: Lockie Mola, MD;  Location: [facility];  Service: Ophthalmology;  Laterality: Right;   CESAREAN SECTION      Medications Prior to Admission  Medication Sig Dispense Refill Last Dose   albuterol (VENTOLIN HFA) 108 (90 Base) MCG/ACT inhaler Inhale into the lungs.   03/20/2023   aspirin EC 81 MG tablet Take 81 mg by mouth daily.   03/19/2023   atorvastatin (LIPITOR) 40 MG tablet Take by mouth.   03/19/2023   clopidogrel (PLAVIX) 75 MG tablet Take 75 mg by mouth daily.   03/19/2023   empagliflozin (JARDIANCE) 25 MG TABS tablet Take 25 mg by mouth daily.      furosemide (LASIX) 40 MG tablet Take 1.5 tablets (60 mg total)  by mouth daily. (Patient taking differently: Take 40 mg by mouth 2 (two) times daily.) 30 tablet 1 03/19/2023   guaiFENesin-dextromethorphan (ROBITUSSIN DM) 100-10 MG/5ML syrup Take 10 mLs by mouth every 6 (six) hours as needed for cough. 118 mL 1 prn at unk   Insulin Aspart FlexPen (NOVOLOG) 100 UNIT/ML Inject 64 Units into the skin in the morning and at bedtime. Before lunch and dinner   03/19/2023   insulin glargine (LANTUS) 100 UNIT/ML injection Inject 100 Units into the skin at bedtime.   03/19/2023   metFORMIN (GLUCOPHAGE) 500 MG tablet Take 1,000 mg by mouth daily with breakfast.   03/19/2023   metFORMIN (GLUCOPHAGE) 500 MG tablet Take 1,000 mg by mouth daily with supper.   03/19/2023   metoprolol succinate (TOPROL-XL) 50 MG 24 hr tablet Take 1 tablet by mouth daily.   03/19/2023   oxybutynin (DITROPAN-XL) 10 MG 24 hr tablet Take 1 tablet by mouth daily.   03/19/2023   sacubitril-valsartan (ENTRESTO) 24-26 MG Take 1 tablet by mouth every 12 (twelve) hours.   03/19/2023   spironolactone (ALDACTONE) 25 MG tablet Take by mouth.   03/19/2023   BD INSULIN SYRINGE U/F 31G X 5/16" 1 ML MISC USE AS DIRECTED ONCE DAILY WITH LEVEMIR (VIAL). (Patient not taking: Reported on 07/09/2022)      Continuous Blood Gluc Sensor (FREESTYLE LIBRE SENSOR SYSTEM) MISC Use 1 kit every 14 (fourteen) days for glucose monitoring  72 hours.  No results for input(s): "LIPASE", "AMYLASE" in the last 72 hours. CBC: Recent Labs    03/23/23 0627 03/24/23 0401  WBC 6.7 6.3  HGB 10.1* 10.4*  HCT 31.0* 31.5*  MCV 93.7 93.2  PLT 253 270   Cardiac Enzymes: No results for input(s): "CKTOTAL", "CKMB", "CKMBINDEX", "TROPONINIHS" in the last 72 hours.  BNP: No results for input(s): "BNP" in the last 72 hours.  D-Dimer: No results for input(s): "DDIMER" in the last 72 hours. Hemoglobin A1C: No results for input(s): "HGBA1C" in the last 72 hours.  Fasting Lipid Panel: No results for input(s): "CHOL", "HDL", "LDLCALC", "TRIG", "CHOLHDL", "LDLDIRECT" in the last 72 hours. Thyroid Function Tests: No results for input(s): "TSH", "T4TOTAL", "T3FREE", "THYROIDAB" in the last 72 hours.  Invalid input(s): "FREET3" Anemia Panel: No results for input(s): "VITAMINB12", "FOLATE", "FERRITIN", "TIBC", "IRON", "RETICCTPCT" in the last 72 hours.   Radiology: DG Chest 2 View  Result Date: 03/22/2023 CLINICAL DATA:  Pneumonia EXAM: CHEST - 2 VIEW COMPARISON:  03/21/2023 FINDINGS: Cardiomegaly. Mild, diffuse interstitial opacity similar to prior examination. No focal airspace opacity. The visualized skeletal structures are unremarkable. IMPRESSION: Cardiomegaly with mild, diffuse interstitial opacity similar to prior examination, consistent with edema or atypical/viral infection. No focal airspace opacity. Electronically Signed   By: Jearld Lesch M.D.   On: 03/22/2023 12:47   DG  Chest 1 View  Result Date: 03/21/2023 CLINICAL DATA:  62 year old female with history of congestive heart failure. EXAM: CHEST  1 VIEW COMPARISON:  Chest x-ray 03/20/2023. FINDINGS: Vague opacity projecting over the left hemithorax. Right lung appears clear. No right pleural effusion. No pneumothorax. No evidence of pulmonary edema. Heart size is normal. The patient is rotated to the left on today's exam, resulting in distortion of the mediastinal contours and reduced diagnostic sensitivity and specificity for mediastinal pathology. IMPRESSION: 1. Extensive airspace consolidation in the left lung compatible with pneumonia better demonstrated on prior chest CT. Overall, aeration has worsened in the left lung compared to the recent chest x-ray. Electronically Signed   By: Trudie Reed M.D.   On: 03/21/2023 07:22   ECHOCARDIOGRAM COMPLETE  Result Date: 03/20/2023    ECHOCARDIOGRAM REPORT   Patient Name:   Julia Simmons Date of Exam: 03/20/2023 Medical Rec #:  [id number]       Height:       65.0 in Accession #:    [id number]      Weight:       318.0 lb Date of Birth:  14-Feb-1961        BSA:          2.409 m Patient Age:    62 years        BP:           103/78 mmHg Patient Gender: F               HR:           96 bpm. Exam Location:  [facility] Procedure: 2D Echo, Color Doppler, Cardiac Doppler and Intracardiac            Opacification Agent Indications:     NSTEMI I21.4  History:         Patient has prior history of Echocardiogram examinations, most                  recent 06/17/2020. Risk Factors:Hypertension, Diabetes and  72 hours.  No results for input(s): "LIPASE", "AMYLASE" in the last 72 hours. CBC: Recent Labs    03/23/23 0627 03/24/23 0401  WBC 6.7 6.3  HGB 10.1* 10.4*  HCT 31.0* 31.5*  MCV 93.7 93.2  PLT 253 270   Cardiac Enzymes: No results for input(s): "CKTOTAL", "CKMB", "CKMBINDEX", "TROPONINIHS" in the last 72 hours.  BNP: No results for input(s): "BNP" in the last 72 hours.  D-Dimer: No results for input(s): "DDIMER" in the last 72 hours. Hemoglobin A1C: No results for input(s): "HGBA1C" in the last 72 hours.  Fasting Lipid Panel: No results for input(s): "CHOL", "HDL", "LDLCALC", "TRIG", "CHOLHDL", "LDLDIRECT" in the last 72 hours. Thyroid Function Tests: No results for input(s): "TSH", "T4TOTAL", "T3FREE", "THYROIDAB" in the last 72 hours.  Invalid input(s): "FREET3" Anemia Panel: No results for input(s): "VITAMINB12", "FOLATE", "FERRITIN", "TIBC", "IRON", "RETICCTPCT" in the last 72 hours.   Radiology: DG Chest 2 View  Result Date: 03/22/2023 CLINICAL DATA:  Pneumonia EXAM: CHEST - 2 VIEW COMPARISON:  03/21/2023 FINDINGS: Cardiomegaly. Mild, diffuse interstitial opacity similar to prior examination. No focal airspace opacity. The visualized skeletal structures are unremarkable. IMPRESSION: Cardiomegaly with mild, diffuse interstitial opacity similar to prior examination, consistent with edema or atypical/viral infection. No focal airspace opacity. Electronically Signed   By: Jearld Lesch M.D.   On: 03/22/2023 12:47   DG  Chest 1 View  Result Date: 03/21/2023 CLINICAL DATA:  62 year old female with history of congestive heart failure. EXAM: CHEST  1 VIEW COMPARISON:  Chest x-ray 03/20/2023. FINDINGS: Vague opacity projecting over the left hemithorax. Right lung appears clear. No right pleural effusion. No pneumothorax. No evidence of pulmonary edema. Heart size is normal. The patient is rotated to the left on today's exam, resulting in distortion of the mediastinal contours and reduced diagnostic sensitivity and specificity for mediastinal pathology. IMPRESSION: 1. Extensive airspace consolidation in the left lung compatible with pneumonia better demonstrated on prior chest CT. Overall, aeration has worsened in the left lung compared to the recent chest x-ray. Electronically Signed   By: Trudie Reed M.D.   On: 03/21/2023 07:22   ECHOCARDIOGRAM COMPLETE  Result Date: 03/20/2023    ECHOCARDIOGRAM REPORT   Patient Name:   Julia Simmons Date of Exam: 03/20/2023 Medical Rec #:  [id number]       Height:       65.0 in Accession #:    [id number]      Weight:       318.0 lb Date of Birth:  14-Feb-1961        BSA:          2.409 m Patient Age:    62 years        BP:           103/78 mmHg Patient Gender: F               HR:           96 bpm. Exam Location:  [facility] Procedure: 2D Echo, Color Doppler, Cardiac Doppler and Intracardiac            Opacification Agent Indications:     NSTEMI I21.4  History:         Patient has prior history of Echocardiogram examinations, most                  recent 06/17/2020. Risk Factors:Hypertension, Diabetes and  72 hours.  No results for input(s): "LIPASE", "AMYLASE" in the last 72 hours. CBC: Recent Labs    03/23/23 0627 03/24/23 0401  WBC 6.7 6.3  HGB 10.1* 10.4*  HCT 31.0* 31.5*  MCV 93.7 93.2  PLT 253 270   Cardiac Enzymes: No results for input(s): "CKTOTAL", "CKMB", "CKMBINDEX", "TROPONINIHS" in the last 72 hours.  BNP: No results for input(s): "BNP" in the last 72 hours.  D-Dimer: No results for input(s): "DDIMER" in the last 72 hours. Hemoglobin A1C: No results for input(s): "HGBA1C" in the last 72 hours.  Fasting Lipid Panel: No results for input(s): "CHOL", "HDL", "LDLCALC", "TRIG", "CHOLHDL", "LDLDIRECT" in the last 72 hours. Thyroid Function Tests: No results for input(s): "TSH", "T4TOTAL", "T3FREE", "THYROIDAB" in the last 72 hours.  Invalid input(s): "FREET3" Anemia Panel: No results for input(s): "VITAMINB12", "FOLATE", "FERRITIN", "TIBC", "IRON", "RETICCTPCT" in the last 72 hours.   Radiology: DG Chest 2 View  Result Date: 03/22/2023 CLINICAL DATA:  Pneumonia EXAM: CHEST - 2 VIEW COMPARISON:  03/21/2023 FINDINGS: Cardiomegaly. Mild, diffuse interstitial opacity similar to prior examination. No focal airspace opacity. The visualized skeletal structures are unremarkable. IMPRESSION: Cardiomegaly with mild, diffuse interstitial opacity similar to prior examination, consistent with edema or atypical/viral infection. No focal airspace opacity. Electronically Signed   By: Jearld Lesch M.D.   On: 03/22/2023 12:47   DG  Chest 1 View  Result Date: 03/21/2023 CLINICAL DATA:  62 year old female with history of congestive heart failure. EXAM: CHEST  1 VIEW COMPARISON:  Chest x-ray 03/20/2023. FINDINGS: Vague opacity projecting over the left hemithorax. Right lung appears clear. No right pleural effusion. No pneumothorax. No evidence of pulmonary edema. Heart size is normal. The patient is rotated to the left on today's exam, resulting in distortion of the mediastinal contours and reduced diagnostic sensitivity and specificity for mediastinal pathology. IMPRESSION: 1. Extensive airspace consolidation in the left lung compatible with pneumonia better demonstrated on prior chest CT. Overall, aeration has worsened in the left lung compared to the recent chest x-ray. Electronically Signed   By: Trudie Reed M.D.   On: 03/21/2023 07:22   ECHOCARDIOGRAM COMPLETE  Result Date: 03/20/2023    ECHOCARDIOGRAM REPORT   Patient Name:   Julia Simmons Date of Exam: 03/20/2023 Medical Rec #:  [id number]       Height:       65.0 in Accession #:    [id number]      Weight:       318.0 lb Date of Birth:  14-Feb-1961        BSA:          2.409 m Patient Age:    62 years        BP:           103/78 mmHg Patient Gender: F               HR:           96 bpm. Exam Location:  [facility] Procedure: 2D Echo, Color Doppler, Cardiac Doppler and Intracardiac            Opacification Agent Indications:     NSTEMI I21.4  History:         Patient has prior history of Echocardiogram examinations, most                  recent 06/17/2020. Risk Factors:Hypertension, Diabetes and  72 hours.  No results for input(s): "LIPASE", "AMYLASE" in the last 72 hours. CBC: Recent Labs    03/23/23 0627 03/24/23 0401  WBC 6.7 6.3  HGB 10.1* 10.4*  HCT 31.0* 31.5*  MCV 93.7 93.2  PLT 253 270   Cardiac Enzymes: No results for input(s): "CKTOTAL", "CKMB", "CKMBINDEX", "TROPONINIHS" in the last 72 hours.  BNP: No results for input(s): "BNP" in the last 72 hours.  D-Dimer: No results for input(s): "DDIMER" in the last 72 hours. Hemoglobin A1C: No results for input(s): "HGBA1C" in the last 72 hours.  Fasting Lipid Panel: No results for input(s): "CHOL", "HDL", "LDLCALC", "TRIG", "CHOLHDL", "LDLDIRECT" in the last 72 hours. Thyroid Function Tests: No results for input(s): "TSH", "T4TOTAL", "T3FREE", "THYROIDAB" in the last 72 hours.  Invalid input(s): "FREET3" Anemia Panel: No results for input(s): "VITAMINB12", "FOLATE", "FERRITIN", "TIBC", "IRON", "RETICCTPCT" in the last 72 hours.   Radiology: DG Chest 2 View  Result Date: 03/22/2023 CLINICAL DATA:  Pneumonia EXAM: CHEST - 2 VIEW COMPARISON:  03/21/2023 FINDINGS: Cardiomegaly. Mild, diffuse interstitial opacity similar to prior examination. No focal airspace opacity. The visualized skeletal structures are unremarkable. IMPRESSION: Cardiomegaly with mild, diffuse interstitial opacity similar to prior examination, consistent with edema or atypical/viral infection. No focal airspace opacity. Electronically Signed   By: Jearld Lesch M.D.   On: 03/22/2023 12:47   DG  Chest 1 View  Result Date: 03/21/2023 CLINICAL DATA:  62 year old female with history of congestive heart failure. EXAM: CHEST  1 VIEW COMPARISON:  Chest x-ray 03/20/2023. FINDINGS: Vague opacity projecting over the left hemithorax. Right lung appears clear. No right pleural effusion. No pneumothorax. No evidence of pulmonary edema. Heart size is normal. The patient is rotated to the left on today's exam, resulting in distortion of the mediastinal contours and reduced diagnostic sensitivity and specificity for mediastinal pathology. IMPRESSION: 1. Extensive airspace consolidation in the left lung compatible with pneumonia better demonstrated on prior chest CT. Overall, aeration has worsened in the left lung compared to the recent chest x-ray. Electronically Signed   By: Trudie Reed M.D.   On: 03/21/2023 07:22   ECHOCARDIOGRAM COMPLETE  Result Date: 03/20/2023    ECHOCARDIOGRAM REPORT   Patient Name:   Julia Simmons Date of Exam: 03/20/2023 Medical Rec #:  [id number]       Height:       65.0 in Accession #:    [id number]      Weight:       318.0 lb Date of Birth:  14-Feb-1961        BSA:          2.409 m Patient Age:    62 years        BP:           103/78 mmHg Patient Gender: F               HR:           96 bpm. Exam Location:  [facility] Procedure: 2D Echo, Color Doppler, Cardiac Doppler and Intracardiac            Opacification Agent Indications:     NSTEMI I21.4  History:         Patient has prior history of Echocardiogram examinations, most                  recent 06/17/2020. Risk Factors:Hypertension, Diabetes and

## 2023-03-25 ENCOUNTER — Inpatient Hospital Stay
Admit: 2023-03-25 | Discharge: 2023-03-25 | Disposition: A | Discharge status: Home / Self Care | Payer: PPO | Attending: Internal Medicine | Admitting: Internal Medicine

## 2023-03-25 ENCOUNTER — Inpatient Hospital Stay: Payer: PPO | Admitting: Certified Registered"

## 2023-03-25 ENCOUNTER — Encounter
Admission: EM | Disposition: A | Discharge status: Home Health | Payer: Self-pay | Source: Home / Self Care | Attending: Internal Medicine

## 2023-03-25 ENCOUNTER — Encounter: Payer: Self-pay | Admitting: Internal Medicine

## 2023-03-25 ENCOUNTER — Other Ambulatory Visit: Payer: Self-pay

## 2023-03-25 ENCOUNTER — Other Ambulatory Visit (HOSPITAL_COMMUNITY): Payer: Self-pay

## 2023-03-25 DIAGNOSIS — A4151 Sepsis due to Escherichia coli [E. coli]: Secondary | ICD-10-CM | POA: Diagnosis not present

## 2023-03-25 DIAGNOSIS — I509 Heart failure, unspecified: Secondary | ICD-10-CM | POA: Insufficient documentation

## 2023-03-25 DIAGNOSIS — A419 Sepsis, unspecified organism: Secondary | ICD-10-CM | POA: Diagnosis not present

## 2023-03-25 DIAGNOSIS — I214 Non-ST elevation (NSTEMI) myocardial infarction: Secondary | ICD-10-CM | POA: Diagnosis not present

## 2023-03-25 DIAGNOSIS — J9601 Acute respiratory failure with hypoxia: Secondary | ICD-10-CM | POA: Diagnosis not present

## 2023-03-25 HISTORY — PX: TEE WITHOUT CARDIOVERSION: SHX5443

## 2023-03-25 LAB — ECHO TEE

## 2023-03-25 LAB — CULTURE, BLOOD (ROUTINE X 2): Culture: NO GROWTH

## 2023-03-25 LAB — BASIC METABOLIC PANEL
Anion gap: 12 (ref 5–15)
BUN: 33 mg/dL — ABNORMAL HIGH (ref 6–20)
CO2: 25 mmol/L (ref 22–32)
Calcium: 9 mg/dL (ref 8.9–10.3)
Chloride: 102 mmol/L (ref 98–111)
Creatinine, Ser: 1.34 mg/dL — ABNORMAL HIGH (ref 0.44–1.00)
GFR, Estimated: 45 mL/min — ABNORMAL LOW (ref >60)
Glucose, Bld: 296 mg/dL — ABNORMAL HIGH (ref 70–99)
Potassium: 4.1 mmol/L (ref 3.5–5.1)
Sodium: 139 mmol/L (ref 135–145)

## 2023-03-25 LAB — GLUCOSE, CAPILLARY
Glucose-Capillary: 282 mg/dL — ABNORMAL HIGH (ref 70–99)
Glucose-Capillary: 274 mg/dL — ABNORMAL HIGH (ref 70–99)
Glucose-Capillary: 251 mg/dL — ABNORMAL HIGH (ref 70–99)
Glucose-Capillary: 290 mg/dL — ABNORMAL HIGH (ref 70–99)
Glucose-Capillary: 298 mg/dL — ABNORMAL HIGH (ref 70–99)

## 2023-03-25 SURGERY — TRANSESOPHAGEAL ECHOCARDIOGRAM (TEE)
Anesthesia: General

## 2023-03-25 MED ORDER — ASPIRIN 81 MG PO CHEW
81.0000 mg | CHEWABLE_TABLET | ORAL | Status: AC
  Administered 2023-03-26: 81 mg via ORAL
  Filled 2023-03-25: qty 1

## 2023-03-25 MED ORDER — INSULIN ASPART 100 UNIT/ML IJ SOLN
0.0000 [IU] | Freq: Three times a day (TID) | INTRAMUSCULAR | Status: DC
  Administered 2023-03-25: 8 [IU] via SUBCUTANEOUS
  Administered 2023-03-26 (×2): 5 [IU] via SUBCUTANEOUS
  Administered 2023-03-27: 11 [IU] via SUBCUTANEOUS
  Filled 2023-03-25 (×3): qty 1

## 2023-03-25 MED ORDER — SPIRONOLACTONE 25 MG PO TABS
25.0000 mg | ORAL_TABLET | Freq: Every day | ORAL | Status: DC
  Administered 2023-03-25 – 2023-03-27 (×3): 25 mg via ORAL
  Filled 2023-03-25 (×3): qty 1

## 2023-03-25 MED ORDER — SODIUM CHLORIDE 0.9 % WEIGHT BASED INFUSION
3.0000 mL/kg/h | INTRAVENOUS | Status: AC
  Administered 2023-03-26: 3 mL/kg/h via INTRAVENOUS

## 2023-03-25 MED ORDER — MIDAZOLAM HCL 2 MG/2ML IJ SOLN
INTRAMUSCULAR | Status: AC
  Filled 2023-03-25: qty 2

## 2023-03-25 MED ORDER — EPHEDRINE 5 MG/ML INJ
INTRAVENOUS | Status: AC
  Filled 2023-03-25: qty 5

## 2023-03-25 MED ORDER — FENTANYL CITRATE (PF) 100 MCG/2ML IJ SOLN
INTRAMUSCULAR | Status: DC | PRN
  Administered 2023-03-25: 50 ug via INTRAVENOUS

## 2023-03-25 MED ORDER — SODIUM CHLORIDE 0.9 % IV SOLN: 250.0000 mL | INTRAVENOUS | Status: DC | PRN

## 2023-03-25 MED ORDER — BUTAMBEN-TETRACAINE-BENZOCAINE 2-2-14 % EX AERO
INHALATION_SPRAY | CUTANEOUS | Status: AC
  Filled 2023-03-25: qty 5

## 2023-03-25 MED ORDER — INSULIN ASPART 100 UNIT/ML IJ SOLN
4.0000 [IU] | Freq: Three times a day (TID) | INTRAMUSCULAR | Status: DC
  Administered 2023-03-25: 4 [IU] via SUBCUTANEOUS

## 2023-03-25 MED ORDER — PROPOFOL 10 MG/ML IV BOLUS
INTRAVENOUS | Status: DC | PRN
  Administered 2023-03-25: 30 mg via INTRAVENOUS
  Administered 2023-03-25: 50 mg via INTRAVENOUS

## 2023-03-25 MED ORDER — SODIUM CHLORIDE 0.9 % IV SOLN: INTRAVENOUS | Status: DC

## 2023-03-25 MED ORDER — PROPOFOL 10 MG/ML IV BOLUS
INTRAVENOUS | Status: AC
  Filled 2023-03-25: qty 20

## 2023-03-25 MED ORDER — FENTANYL CITRATE (PF) 100 MCG/2ML IJ SOLN
INTRAMUSCULAR | Status: AC
  Filled 2023-03-25: qty 2

## 2023-03-25 MED ORDER — LIDOCAINE HCL (PF) 2 % IJ SOLN
INTRAMUSCULAR | Status: AC
  Filled 2023-03-25: qty 5

## 2023-03-25 MED ORDER — LIDOCAINE VISCOUS HCL 2 % MT SOLN
OROMUCOSAL | Status: AC
  Filled 2023-03-25: qty 15

## 2023-03-25 MED ORDER — SODIUM CHLORIDE 0.9% FLUSH: 3.0000 mL | INTRAVENOUS | Status: DC | PRN

## 2023-03-25 MED ORDER — LIDOCAINE HCL (CARDIAC) PF 100 MG/5ML IV SOSY
PREFILLED_SYRINGE | INTRAVENOUS | Status: DC | PRN
  Administered 2023-03-25: 100 mg via INTRAVENOUS

## 2023-03-25 MED ORDER — ASPIRIN 81 MG PO TBEC
81.0000 mg | DELAYED_RELEASE_TABLET | Freq: Every day | ORAL | Status: DC
  Administered 2023-03-27: 81 mg via ORAL
  Filled 2023-03-25: qty 1

## 2023-03-25 MED ORDER — INSULIN GLARGINE-YFGN 100 UNIT/ML ~~LOC~~ SOLN
100.0000 [IU] | Freq: Every day | SUBCUTANEOUS | Status: DC
  Administered 2023-03-26 – 2023-03-27 (×2): 100 [IU] via SUBCUTANEOUS
  Filled 2023-03-25 (×3): qty 1

## 2023-03-25 MED ORDER — SODIUM CHLORIDE 0.9 % WEIGHT BASED INFUSION
1.0000 mL/kg/h | INTRAVENOUS | Status: DC
  Administered 2023-03-26 (×2): 1 mL/kg/h via INTRAVENOUS

## 2023-03-25 MED ORDER — INSULIN ASPART 100 UNIT/ML IJ SOLN
0.0000 [IU] | Freq: Every day | INTRAMUSCULAR | Status: DC
  Administered 2023-03-25: 3 [IU] via SUBCUTANEOUS
  Administered 2023-03-26: 2 [IU] via SUBCUTANEOUS
  Filled 2023-03-25 (×2): qty 1

## 2023-03-25 MED ORDER — MIDAZOLAM HCL 2 MG/2ML IJ SOLN
INTRAMUSCULAR | Status: DC | PRN
  Administered 2023-03-25: 1 mg via INTRAVENOUS

## 2023-03-25 NOTE — Progress Notes (Signed)
*  PRELIMINARY RESULTS* Echocardiogram Echocardiogram Transesophageal has been performed.  Cristela Blue 03/25/2023, 12:50 PM

## 2023-03-25 NOTE — Progress Notes (Signed)
[facility] HF Stewardship  PCP: Patrice Paradise, MD  PCP-Cardiologist: None  HPI: Julia Simmons is a 62 y.o. female with hypertension, hyperlipidemia, type 2 diabetes, OSA on CPAP, morbid obesity, CAD s/p DES to LAD 11/2021, ischemic cardiomyopathy, chronic HFrEF (EF 35-40% 11/2021), R MCA CVA 11/2021, who presented with general malaise, dysuria, and chills.  Troponin was 507 on admission, trending up to a peak of 10,699 before trending back down. Suspected type I MI, however may also be type II MI. BNP normal on admission. Lactate of 2.4 on admission. PCT markedly elevated at 24. CTA negative for PE but suggestive of multifocal pneumonia. Bcx showed E. coli bacteremia.   Pertinent cardiac history: TTE in 05/2020 with LVEF of 60-65%, G2DD, normal RV. Cardiac cath at [facility] in 11/2021 showed LAD stenosis treated with DES to the proximal and mid segments requiring Shockwave lithotripsy. Noted ot have 75% stenosis of the RCA at the time. TTE 03/20/23 with LVEF down to 25-30%, G1DD, mild MR, trivial Tr, and small mobile echogenicity noted on TTE. Cardiology ordered TEE along with LHC.  Pertinent Lab Values: Creatinine, Ser  Date Value Ref Range Status  03/24/2023 1.30 (H) 0.44 - 1.00 mg/dL Final   BUN  Date Value Ref Range Status  03/24/2023 31 (H) 6 - 20 mg/dL Final   Potassium  Date Value Ref Range Status  03/24/2023 4.4 3.5 - 5.1 mmol/L Final   Sodium  Date Value Ref Range Status  03/24/2023 135 135 - 145 mmol/L Final   B Natriuretic Peptide  Date Value Ref Range Status  03/20/2023 59.0 0.0 - 100.0 pg/mL Final    Comment:    Performed at [facility], [address] [place], [place] [address]   Magnesium  Date Value Ref Range Status  06/15/2020 1.8 1.7 - 2.4 mg/dL Final    Comment:    Performed at [facility], [address] [place], [place] [address]   Hgb A1c MFr Bld  Date Value Ref Range Status  03/20/2023 8.3 (H) 4.8 - 5.6 % Final    Comment:     (NOTE) Pre diabetes:          5.7%-6.4%  Diabetes:              >6.4%  Glycemic control for   <7.0% adults with diabetes     Vital Signs: Temp:  [97.5 F (36.4 C)-99 F (37.2 C)] 99 F (37.2 C) (10/07 0839) Pulse Rate:  [67-72] 71 (10/07 0839) Cardiac Rhythm: Normal sinus rhythm (10/07 0700) Resp:  [16-20] 18 (10/07 0839) BP: (115-156)/(54-99) 131/69 (10/07 0839) SpO2:  [85 %-98 %] 92 % (10/07 0839) FiO2 (%):  [28 %] 28 % (10/07 0000)  Intake/Output Summary (Last 24 hours) at 03/25/2023 0847 Last data filed at 03/25/2023 0441 Gross per 24 hour  Intake 240 ml  Output --  Net 240 ml   Current Inpatient Medications:  -Metoprolol succinate 25 mg BID -Entresto 24-26 mg BID -Furosemide 40 mg BID  Prior to admission HF Medications:  -Metoprolol succinate 50 mg daily -Entresto 24-26 mg BID -Spironolactone 25 mg daily -Jardiance 25 mg daily -Furosemide 40 mg BID  Assessment: 1. 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 NYHA class II-III symptoms.  -BNP on admission was normal, creatinine/BUN improved after holding diuretics (was dry prior to admission on furosemide 40 mg BID in the setting of sepsis). With multiple IV and PO fluids and creatinine  improving, oral diuretics were resumed. Weight documented up 9 lbs from admission, however patient denies shortness of breath or swelling. Difficult to assess LEE given body habitus. -BP soft on admission, but now elevated in the 130-140s/70s. If BP remains elevated, would recommend restarting spironolactone pending today's creatinine/potassium. -Not a good candidate for resuming Jardiance at discharge given multiple UTIs including pyelonephritis. -Will follow plans for cardiac cath/TEE this week.  Plan: 1) Medication changes recommended at this time: -Consider adding a renal function panel today. If creatinine and potassium are stable, can consider resuming home  spironolactone.  2) Patient assistance: -None required  3) Education: - Patient has been educated on current HF medications and potential additions to HF medication regimen - Patient verbalizes understanding that over the next few months, these medication doses may change and more medications may be added to optimize HF regimen - Patient has been educated on basic disease state pathophysiology and goals of therapy  Medication Assistance / Insurance Benefits Check:  Does the patient have prescription insurance? Prescription Insurance: [employer] ([employer])  Type of insurance plan:   Does the patient qualify for medication assistance through manufacturers or grants? No  Outpatient Pharmacy:  Prior to admission [facility] Is the patient agreeable to switch to [facility]?: Yes   Please do not hesitate to reach out with questions or concerns,  Enos Fling, PharmD, BCPS Heart Failure Pharmacist Phone - [phone] 03/25/2023 8:47 AM

## 2023-03-25 NOTE — Anesthesia Procedure Notes (Signed)
Date/Time: 03/25/2023 12:41 PM  Performed by: Corinda Gubler, MDPre-anesthesia Checklist: Patient being monitored, Timeout performed, Emergency Drugs available, Suction available and Patient identified Patient Re-evaluated:Patient Re-evaluated prior to induction Oxygen Delivery Method: Supernova nasal CPAP Preoxygenation: Pre-oxygenation with 100% oxygen Induction Type: IV induction Number of attempts: 1 Placement Confirmation: positive ETCO2 and breath sounds checked- equal and bilateral Dental Injury: Teeth and Oropharynx as per pre-operative assessment

## 2023-03-25 NOTE — Transfer of Care (Signed)
Immediate Anesthesia Transfer of Care Note  Patient: Julia Simmons  Procedure(s) Performed: TRANSESOPHAGEAL ECHOCARDIOGRAM  Patient Location: Cath Lab  Anesthesia Type:General  Level of Consciousness: drowsy  Airway & Oxygen Therapy: Patient Spontanous Breathing and Patient connected to face mask oxygen  Post-op Assessment: Report given to RN and Post -op Vital signs reviewed and stable  Post vital signs: Reviewed and stable  Last Vitals:  Vitals Value Taken Time  BP    Temp    Pulse    Resp    SpO2      Last Pain:  Vitals:   03/25/23 1112  TempSrc: Oral  PainSc: 0-No pain         Complications: No notable events documented.

## 2023-03-25 NOTE — Care Management Important Message (Signed)
Important Message  Patient Details  Name: Julia Simmons MRN: [id number] Date of Birth: [birth year]   Important Message Given:  Yes - Medicare IM     Tramon Crescenzo, Nyajah Hyson 03/25/2023, 3:39 PM

## 2023-03-25 NOTE — Progress Notes (Signed)
Transition of Care [facility]) - Inpatient Brief Assessment   Patient Details  Name: GIZEL RIEDLINGER MRN: [id number] Date of Birth: 11/17/1960  Transition of Care [facility]) CM/SW Contact:    Truddie Hidden, RN Phone Number: 03/25/2023, 10:11 AM   Clinical Narrative: TOC continuing to follow patient's progress throughout discharge planning.   Transition of Care Asessment: Insurance and Status: Insurance coverage has been reviewed Patient has primary care physician: Yes Home environment has been reviewed: home Prior level of function:: independent Prior/Current Home Services: No current home services Social Determinants of Health Reivew: SDOH reviewed no interventions necessary Readmission risk has been reviewed: Yes Transition of care needs: no transition of care needs at this time

## 2023-03-25 NOTE — TOC Benefit Eligibility Note (Signed)
Patient Product/process development scientist completed.    The patient is insured through [employer]/ [employer]. Patient has Medicare and is not eligible for a copay card, but may be able to apply for patient assistance, if available.    Ran test claim for Mounjaro 2.5/0.5 ml and Requires Prior Authorization  Ran test claim for Ozempic  2 mg/3 mleand Requires Prior Authorization  This test claim was processed through [employer]- copay amounts may vary at other pharmacies due to [employer], or as the patient moves through the different stages of their insurance plan.     Roland Earl, CPHT Pharmacy Technician III Certified Patient Advocate [facility] Patient Advocate Team Direct Number: [phone]  Fax: [phone]

## 2023-03-25 NOTE — Anesthesia Preprocedure Evaluation (Signed)
Anesthesia Evaluation  Patient identified by MRN, date of birth, ID band Patient awake    Reviewed: Allergy & Precautions, NPO status , Patient's Chart, lab work & pertinent test results  History of Anesthesia Complications Negative for: history of anesthetic complications  Airway Mallampati: II  TM Distance: >3 FB Neck ROM: Full    Dental no notable dental hx. (+) Teeth Intact   Pulmonary sleep apnea and Continuous Positive Airway Pressure Ventilation , pneumonia, resolved, neg COPD, Patient abstained from smoking.Not current smoker On room air as of today    + decreased breath sounds      Cardiovascular Exercise Tolerance: Poor METShypertension, + CAD, + Past MI, + Cardiac Stents and +CHF  (-) dysrhythmias  Rhythm:Regular Rate:Normal - Systolic murmurs Julia Simmons is a 62 y.o. female  with a past medical history of coronary artery disease s/p DES to LAD 11/2021, ischemic cardiomyopathy, chronic HFrEF (EF 35-40% 11/2021), R MCA CVA 11/2021, hypertension, hyperlipidemia, type 2 diabetes, OSA on CPAP, morbid obesity who presented to the ED on 03/20/2023 for malaise, dysuria.   Patient with troponin elevation from 507 > 2906 > 10699 > 7673. She is without chest pain. Echo with EF 25-30% (down from 35-40% 11/2021) with WMAs.     Neuro/Psych negative neurological ROS  negative psych ROS   GI/Hepatic ,neg GERD  ,,(+)     (-) substance abuse    Endo/Other  diabetes, Insulin Dependent  Morbid obesity  Renal/GU negative Renal ROS     Musculoskeletal   Abdominal  (+) + obese  Peds  Hematology   Anesthesia Other Findings Past Medical History: No date: Charcot's joint of foot No date: Coronary artery disease No date: Diabetes mellitus with neuropathy (HCC) No date: Diabetic retinopathy (HCC) No date: Hypertension No date: Morbid obesity with BMI of 50.0-59.9, adult (HCC) No date: Sleep apnea No date: Stroke [facility])   Reproductive/Obstetrics                             Anesthesia Physical Anesthesia Plan  ASA: 4  Anesthesia Plan: General   Post-op Pain Management: Minimal or no pain anticipated   Induction: Intravenous  PONV Risk Score and Plan: 3 and Propofol infusion, TIVA, Ondansetron and Midazolam  Airway Management Planned: Natural Airway and Nasal CPAP  Additional Equipment: None  Intra-op Plan:   Post-operative Plan:   Informed Consent: I have reviewed the patients History and Physical, chart, labs and discussed the procedure including the risks, benefits and alternatives for the proposed anesthesia with the patient or authorized representative who has indicated his/her understanding and acceptance.     Dental advisory given  Plan Discussed with: CRNA and Surgeon  Anesthesia Plan Comments: (Discussed risks of anesthesia with patient, including possibility of difficulty with spontaneous ventilation under anesthesia necessitating airway intervention, PONV, and rare risks such as cardiac or respiratory or neurological events, and allergic reactions. Discussed the role of CRNA in patient's perioperative care. Patient understands. Patient counseled on being higher risk for anesthesia due to comorbidities: HFrEF, morbid obesity. Patient was told about increased risk of cardiac and respiratory events, including death. )       Anesthesia Quick Evaluation

## 2023-03-25 NOTE — Progress Notes (Signed)
Date of Admission:  03/20/2023     ID: Julia Simmons is a 62 y.o. female Principal Problem:   Severe sepsis (HCC) Active Problems:   PNA (pneumonia)   Diabetes mellitus with neuropathy (HCC)   Hypertension   Morbid obesity with BMI of 50.0-59.9, adult (HCC)   Sepsis secondary to UTI [facility])   CAD S/P percutaneous coronary angioplasty   NSTEMI (non-ST elevated myocardial infarction) (HCC)   E. coli septicemia (HCC)   Chronic systolic CHF (congestive heart failure) (HCC)   Acute hypoxemic respiratory failure (HCC)   E coli bacteremia    Subjective: Pt feeling better Had TEE today and no vegetation   Medications:   [START ON 03/26/2023] aspirin  81 mg Oral Pre-Cath   [START ON 03/27/2023] aspirin EC  81 mg Oral Daily   atorvastatin  40 mg Oral Daily   bacitracin   Topical BID   budesonide (PULMICORT) nebulizer solution  0.5 mg Nebulization Q12H   clopidogrel  75 mg Oral Daily   furosemide  40 mg Oral BID   insulin aspart  0-15 Units Subcutaneous TID WC   insulin aspart  0-5 Units Subcutaneous QHS   insulin aspart  4 Units Subcutaneous TID WC   [START ON 03/26/2023] insulin glargine-yfgn  100 Units Subcutaneous Daily   ipratropium-albuterol  3 mL Nebulization BID   linagliptin  5 mg Oral Daily   metoprolol succinate  25 mg Oral BID   oxybutynin  10 mg Oral Daily   sacubitril-valsartan  1 tablet Oral BID   sodium chloride flush  3 mL Intravenous Q12H   spironolactone  25 mg Oral Daily    Objective: Vital signs in last 24 hours: Patient Vitals for the past 24 hrs:  BP Temp Temp src Pulse Resp SpO2 Height Weight  03/25/23 1405 -- -- -- -- -- -- -- (!) 141.5 kg  03/25/23 1350 (!) 141/71 98.8 F (37.1 C) Oral 67 18 (!) 89 % -- --  03/25/23 1330 135/70 -- -- 66 (!) 22 90 % -- --  03/25/23 1315 137/70 -- -- 66 (!) 30 90 % -- --  03/25/23 1300 133/70 -- -- 68 (!) 27 92 % -- --  03/25/23 1251 136/70 -- -- 65 (!) 29 100 % -- --  03/25/23 1248 129/75 -- -- 64 (!) 25 97 % --  --  03/25/23 1112 (!) 156/85 98.3 F (36.8 C) Oral 69 13 97 % 5\' 5"  (1.651 m) (!) 146.7 kg  03/25/23 0839 131/69 99 F (37.2 C) Oral 71 18 92 % -- --  03/25/23 0428 (!) 126/58 (!) 97.5 F (36.4 C) Axillary 67 20 96 % -- --  03/25/23 0026 (!) 120/54 (!) 97.5 F (36.4 C) Axillary -- 18 92 % -- --  03/24/23 2235 -- -- -- -- -- 96 % -- --  03/24/23 2057 -- -- -- -- -- 94 % -- --  03/24/23 2038 (!) 156/76 98.1 F (36.7 C) Oral 71 18 94 % -- --     PHYSICAL EXAM:  General: Alert, cooperative, no distress, appears stated age.  Lungs: b/l air entry- decreased left side Heart: Regular rate and rhythm, no murmur, rub or gallop. Abdomen: Soft, non-tender,not distended. Bowel sounds normal. No masses Extremities: atraumatic, no cyanosis. No edema. No clubbing Skin: No rashes or lesions. Or bruising Lymph: Cervical, supraclavicular normal. Neurologic: Grossly non-focal  Lab Results    Latest Ref Rng & Units 03/24/2023    4:01 AM 03/23/2023  6:27 AM 03/22/2023    3:05 AM  CBC  WBC 4.0 - 10.5 K/uL 6.3  6.7  8.3   Hemoglobin 12.0 - 15.0 g/dL 16.1  09.6  04.5   Hematocrit 36.0 - 46.0 % 31.5  31.0  32.5   Platelets 150 - 400 K/uL 270  253  257        Latest Ref Rng & Units 03/25/2023    9:15 AM 03/24/2023    9:17 AM 03/22/2023    3:05 AM  CMP  Glucose 70 - 99 mg/dL 409  811  914   BUN 6 - 20 mg/dL 33  31  29   Creatinine 0.44 - 1.00 mg/dL 7.82  9.56  2.13   Sodium 135 - 145 mmol/L 139  135  139   Potassium 3.5 - 5.1 mmol/L 4.1  4.4  3.9   Chloride 98 - 111 mmol/L 102  102  106   CO2 22 - 32 mmol/L 25  19  24    Calcium 8.9 - 10.3 mg/dL 9.0  8.5  8.4       Microbiology: [facility]- Ecoli  Studies/Results:  Cxr   Infiltrate seen on 03/20/23 is much improved on 03/22/23  ECHO TEE  Result Date: 03/25/2023    TRANSESOPHOGEAL ECHO REPORT   Patient Name:   Julia Simmons Date of Exam: 03/25/2023 Medical Rec #:  [id number]       Height:       65.0 in Accession #:    [id number]      Weight:        323.4 lb Date of Birth:  07-Aug-1960        BSA:          2.426 m Patient Age:    62 years        BP:           129/75 mmHg Patient Gender: F               HR:           64 bpm. Exam Location:  [facility] Procedure: Transesophageal Echo, Cardiac Doppler and Color Doppler Indications:     SBE (subacute bacterial endocarditis) I33.0  History:         Patient has prior history of Echocardiogram examinations, most                  recent 03/20/2023. Risk Factors:Hypertension and Diabetes.  Sonographer:     Cristela Blue Referring Phys:  [id number] DWAYNE D CALLWOOD Diagnosing Phys: Windell Norfolk PROCEDURE: The transesophogeal probe was passed without difficulty through the esophogus of the patient. Local oropharyngeal anesthetic was provided with Cetacaine. Sedation performed by different physician. Image quality was excellent. The patient developed no complications during the procedure. Sedation provided by anesthesiologist.  IMPRESSIONS  1. Left ventricular ejection fraction, by estimation, is 25 to 30%. The left ventricle has severely decreased function.  2. Right ventricular systolic function is normal. The right ventricular size is normal.  3. No left atrial/left atrial appendage thrombus was detected.  4. The mitral valve is normal in structure. Mild mitral valve regurgitation.  5. No valvular vegetation. The aortic valve is tricuspid. There is mild calcification of the aortic valve. There is mild thickening of the aortic valve. Aortic valve regurgitation is not visualized. FINDINGS  Left Ventricle: Left ventricular ejection fraction, by estimation, is 25 to 30%. The left ventricle has severely decreased function. The left ventricular internal cavity size was normal in  size. Right Ventricle: The right ventricular size is normal. No increase in right ventricular wall thickness. Right ventricular systolic function is normal. Left Atrium: Left atrial size was normal in size. No left atrial/left atrial appendage thrombus was  detected. Right Atrium: Right atrial size was normal in size. Pericardium: There is no evidence of pericardial effusion. Mitral Valve: The mitral valve is normal in structure. Mild mitral valve regurgitation. Tricuspid Valve: The tricuspid valve is normal in structure. Tricuspid valve regurgitation is trivial. Aortic Valve: No valvular vegetation. The aortic valve is tricuspid. There is mild calcification of the aortic valve. There is mild thickening of the aortic valve. Aortic valve regurgitation is not visualized. Pulmonic Valve: The pulmonic valve was normal in structure. Pulmonic valve regurgitation is trivial. Aorta: The aortic root is normal in size and structure. IAS/Shunts: The interatrial septum was not well visualized. Mellody Drown Alluri Electronically signed by Windell Norfolk Signature Date/Time: 03/25/2023/3:52:05 PM    Final      Assessment/Plan:   Sepsis  with E coli bacteremia source due to UTI TEE no vegetation    questionable rt ureteric enhancement ? Neoplasm as OP On ceftriaxone- day 6 - would need a total of 10-14 days of antibiotic    Severe left lung infiltrate on imaging- ? CAP ? Due to [facility] or viral or aspiraiton or ARDS or CHF Much improved on 03/22/23  resp viral pcr N Procal very high Completed azithromycin   Morbid obesity   OSA-on CPAP   DM on insulin  Charcot foot- right with trophic ulcer- very superficial and does not look infected   CAD s/p stent on metoprolol, atorvastatin CHF- EF 25% on frusemide, entresto, aldactone Awaiting Cardiac cath   Discussed with patient, and Dr.Zhang

## 2023-03-25 NOTE — Progress Notes (Signed)
without gallops or murmurs.  Abdomen: Non-distended appearing.  Msk: Normal strength and tone for age. Extremities: Warm and well perfused. No clubbing, cyanosis.  Trace edema.  Neuro: Alert and oriented X 3. Psych: Answers questions appropriately.   Labs: Basic Metabolic Panel: Recent Labs    03/24/23 0917 03/25/23 0915  NA 135 139  K 4.4 4.1  CL 102 102  CO2 19* 25  GLUCOSE 262* 296*  BUN 31* 33*  CREATININE 1.30* 1.34*  CALCIUM 8.5* 9.0   Liver Function Tests: No results for input(s): "AST", "ALT", "ALKPHOS", "BILITOT", "PROT", "ALBUMIN" in the last 72 hours.  No results for input(s): "LIPASE", "AMYLASE" in the last 72 hours. CBC: Recent Labs    03/23/23 0627 03/24/23 0401  WBC 6.7 6.3  HGB 10.1* 10.4*  HCT 31.0* 31.5*  MCV 93.7 93.2  PLT 253 270   Cardiac Enzymes: No results for input(s): "CKTOTAL", "CKMB", "CKMBINDEX", "TROPONINIHS" in the last 72 hours.  BNP: No results for input(s): "BNP" in the last 72 hours.  D-Dimer: No results for input(s): "DDIMER" in the last 72 hours. Hemoglobin A1C: No results for input(s): "HGBA1C" in the last 72 hours.  Fasting Lipid Panel: No results for input(s): "CHOL", "HDL", "LDLCALC", "TRIG", "CHOLHDL", "LDLDIRECT" in the last 72 hours. Thyroid Function Tests: No results for input(s): "TSH", "T4TOTAL", "T3FREE", "THYROIDAB" in  the last 72 hours.  Invalid input(s): "FREET3" Anemia Panel: No results for input(s): "VITAMINB12", "FOLATE", "FERRITIN", "TIBC", "IRON", "RETICCTPCT" in the last 72 hours.   Radiology: DG Chest 2 View  Result Date: 03/22/2023 CLINICAL DATA:  Pneumonia EXAM: CHEST - 2 VIEW COMPARISON:  03/21/2023 FINDINGS: Cardiomegaly. Mild, diffuse interstitial opacity similar to prior examination. No focal airspace opacity. The visualized skeletal structures are unremarkable. IMPRESSION: Cardiomegaly with mild, diffuse interstitial opacity similar to prior examination, consistent with edema or atypical/viral infection. No focal airspace opacity. Electronically Signed   By: Jearld Lesch M.D.   On: 03/22/2023 12:47   DG Chest 1 View  Result Date: 03/21/2023 CLINICAL DATA:  62 year old female with history of congestive heart failure. EXAM: CHEST  1 VIEW COMPARISON:  Chest x-ray 03/20/2023. FINDINGS: Vague opacity projecting over the left hemithorax. Right lung appears clear. No right pleural effusion. No pneumothorax. No evidence of pulmonary edema. Heart size is normal. The patient is rotated to the left on today's exam, resulting in distortion of the mediastinal contours and reduced diagnostic sensitivity and specificity for mediastinal pathology. IMPRESSION: 1. Extensive airspace consolidation in the left lung compatible with pneumonia better demonstrated on prior chest CT. Overall, aeration has worsened in the left lung compared to the recent chest x-ray. Electronically Signed   By: Trudie Reed M.D.   On: 03/21/2023 07:22   ECHOCARDIOGRAM COMPLETE  Result Date: 03/20/2023    ECHOCARDIOGRAM REPORT   Patient Name:   Julia Simmons Date of Exam: 03/20/2023 Medical Rec #:  [id number]       Height:       65.0 in Accession #:    [id number]      Weight:       318.0 lb Date of Birth:  [birth year]        BSA:          2.409 m Patient Age:    62 years        BP:           103/78 mmHg Patient Gender: F               HR:  [facility] CLINIC CARDIOLOGY CONSULT NOTE       Patient ID: Julia Simmons MRN: [id number] DOB/AGE: 1960-12-09 62 y.o.  Admit date: 03/20/2023 Referring Physician Dr. Mikey College Primary Physician Dr. Maurine Minister Primary Cardiologist Dr. Darrold Junker Reason for Consultation NSTEMI  HPI: Julia Simmons is a 62 y.o. female  with a past medical history of coronary artery disease s/p DES to LAD 11/2021, ischemic cardiomyopathy, chronic HFrEF (EF 35-40% 11/2021), R MCA CVA 11/2021, hypertension, hyperlipidemia, type 2 diabetes, OSA on CPAP, morbid obesity who presented to the ED on 03/20/2023 for malaise, dysuria. Cardiology was consulted for further evaluation.   Interval history: -Patient feeling ok this AM, denies any palpitations, chest pain.  -Weaned to room air, denies any shortness of breath. Feels her breathing is much improved. -BP and HR remain stable.  -Plan for TEE today.   Review of systems complete and found to be negative unless listed above    Past Medical History:  Diagnosis Date   Charcot's joint of foot    Coronary artery disease    Diabetes mellitus with neuropathy (HCC)    Diabetic retinopathy (HCC)    Hypertension    Morbid obesity with BMI of 50.0-59.9, adult (HCC)    Sleep apnea    Stroke [facility])     Past Surgical History:  Procedure Laterality Date   CATARACT EXTRACTION W/PHACO Right 07/18/2022   Procedure: CATARACT EXTRACTION PHACO AND INTRAOCULAR LENS PLACEMENT (IOC) RIGHT DIABETIC HEALON 5 VISION BLUE 33.79 2:31.3;  Surgeon: Lockie Mola, MD;  Location: [facility];  Service: Ophthalmology;  Laterality: Right;   CESAREAN SECTION      Medications Prior to Admission  Medication Sig Dispense Refill Last Dose   albuterol (VENTOLIN HFA) 108 (90 Base) MCG/ACT inhaler Inhale into the lungs.   03/20/2023   aspirin EC 81 MG tablet Take 81 mg by mouth daily.   03/19/2023   atorvastatin (LIPITOR) 40 MG tablet Take by mouth.   03/19/2023   clopidogrel  (PLAVIX) 75 MG tablet Take 75 mg by mouth daily.   03/19/2023   furosemide (LASIX) 40 MG tablet Take 1.5 tablets (60 mg total) by mouth daily. (Patient taking differently: Take 40 mg by mouth 2 (two) times daily.) 30 tablet 1 03/19/2023   guaiFENesin-dextromethorphan (ROBITUSSIN DM) 100-10 MG/5ML syrup Take 10 mLs by mouth every 6 (six) hours as needed for cough. 118 mL 1 prn at unk   Insulin Aspart FlexPen (NOVOLOG) 100 UNIT/ML Inject 64 Units into the skin in the morning and at bedtime. Before lunch and dinner   03/19/2023   insulin glargine (LANTUS) 100 UNIT/ML injection Inject 100 Units into the skin at bedtime.   03/19/2023   metFORMIN (GLUCOPHAGE) 500 MG tablet Take 1,000 mg by mouth daily with breakfast.   03/19/2023   metFORMIN (GLUCOPHAGE) 500 MG tablet Take 1,000 mg by mouth daily with supper.   03/19/2023   metoprolol succinate (TOPROL-XL) 50 MG 24 hr tablet Take 1 tablet by mouth daily.   03/19/2023   oxybutynin (DITROPAN-XL) 10 MG 24 hr tablet Take 1 tablet by mouth daily.   03/19/2023   sacubitril-valsartan (ENTRESTO) 24-26 MG Take 1 tablet by mouth every 12 (twelve) hours.   03/19/2023   spironolactone (ALDACTONE) 25 MG tablet Take by mouth.   03/19/2023   BD INSULIN SYRINGE U/F 31G X 5/16" 1 ML MISC USE AS DIRECTED ONCE DAILY WITH LEVEMIR (VIAL). (Patient not taking: Reported on 07/09/2022)      Continuous Blood Gluc Sensor (  without gallops or murmurs.  Abdomen: Non-distended appearing.  Msk: Normal strength and tone for age. Extremities: Warm and well perfused. No clubbing, cyanosis.  Trace edema.  Neuro: Alert and oriented X 3. Psych: Answers questions appropriately.   Labs: Basic Metabolic Panel: Recent Labs    03/24/23 0917 03/25/23 0915  NA 135 139  K 4.4 4.1  CL 102 102  CO2 19* 25  GLUCOSE 262* 296*  BUN 31* 33*  CREATININE 1.30* 1.34*  CALCIUM 8.5* 9.0   Liver Function Tests: No results for input(s): "AST", "ALT", "ALKPHOS", "BILITOT", "PROT", "ALBUMIN" in the last 72 hours.  No results for input(s): "LIPASE", "AMYLASE" in the last 72 hours. CBC: Recent Labs    03/23/23 0627 03/24/23 0401  WBC 6.7 6.3  HGB 10.1* 10.4*  HCT 31.0* 31.5*  MCV 93.7 93.2  PLT 253 270   Cardiac Enzymes: No results for input(s): "CKTOTAL", "CKMB", "CKMBINDEX", "TROPONINIHS" in the last 72 hours.  BNP: No results for input(s): "BNP" in the last 72 hours.  D-Dimer: No results for input(s): "DDIMER" in the last 72 hours. Hemoglobin A1C: No results for input(s): "HGBA1C" in the last 72 hours.  Fasting Lipid Panel: No results for input(s): "CHOL", "HDL", "LDLCALC", "TRIG", "CHOLHDL", "LDLDIRECT" in the last 72 hours. Thyroid Function Tests: No results for input(s): "TSH", "T4TOTAL", "T3FREE", "THYROIDAB" in  the last 72 hours.  Invalid input(s): "FREET3" Anemia Panel: No results for input(s): "VITAMINB12", "FOLATE", "FERRITIN", "TIBC", "IRON", "RETICCTPCT" in the last 72 hours.   Radiology: DG Chest 2 View  Result Date: 03/22/2023 CLINICAL DATA:  Pneumonia EXAM: CHEST - 2 VIEW COMPARISON:  03/21/2023 FINDINGS: Cardiomegaly. Mild, diffuse interstitial opacity similar to prior examination. No focal airspace opacity. The visualized skeletal structures are unremarkable. IMPRESSION: Cardiomegaly with mild, diffuse interstitial opacity similar to prior examination, consistent with edema or atypical/viral infection. No focal airspace opacity. Electronically Signed   By: Jearld Lesch M.D.   On: 03/22/2023 12:47   DG Chest 1 View  Result Date: 03/21/2023 CLINICAL DATA:  62 year old female with history of congestive heart failure. EXAM: CHEST  1 VIEW COMPARISON:  Chest x-ray 03/20/2023. FINDINGS: Vague opacity projecting over the left hemithorax. Right lung appears clear. No right pleural effusion. No pneumothorax. No evidence of pulmonary edema. Heart size is normal. The patient is rotated to the left on today's exam, resulting in distortion of the mediastinal contours and reduced diagnostic sensitivity and specificity for mediastinal pathology. IMPRESSION: 1. Extensive airspace consolidation in the left lung compatible with pneumonia better demonstrated on prior chest CT. Overall, aeration has worsened in the left lung compared to the recent chest x-ray. Electronically Signed   By: Trudie Reed M.D.   On: 03/21/2023 07:22   ECHOCARDIOGRAM COMPLETE  Result Date: 03/20/2023    ECHOCARDIOGRAM REPORT   Patient Name:   Julia Simmons Date of Exam: 03/20/2023 Medical Rec #:  [id number]       Height:       65.0 in Accession #:    [id number]      Weight:       318.0 lb Date of Birth:  [birth year]        BSA:          2.409 m Patient Age:    62 years        BP:           103/78 mmHg Patient Gender: F               HR:  [facility] CLINIC CARDIOLOGY CONSULT NOTE       Patient ID: Julia Simmons MRN: [id number] DOB/AGE: 1960-12-09 62 y.o.  Admit date: 03/20/2023 Referring Physician Dr. Mikey College Primary Physician Dr. Maurine Minister Primary Cardiologist Dr. Darrold Junker Reason for Consultation NSTEMI  HPI: Julia Simmons is a 62 y.o. female  with a past medical history of coronary artery disease s/p DES to LAD 11/2021, ischemic cardiomyopathy, chronic HFrEF (EF 35-40% 11/2021), R MCA CVA 11/2021, hypertension, hyperlipidemia, type 2 diabetes, OSA on CPAP, morbid obesity who presented to the ED on 03/20/2023 for malaise, dysuria. Cardiology was consulted for further evaluation.   Interval history: -Patient feeling ok this AM, denies any palpitations, chest pain.  -Weaned to room air, denies any shortness of breath. Feels her breathing is much improved. -BP and HR remain stable.  -Plan for TEE today.   Review of systems complete and found to be negative unless listed above    Past Medical History:  Diagnosis Date   Charcot's joint of foot    Coronary artery disease    Diabetes mellitus with neuropathy (HCC)    Diabetic retinopathy (HCC)    Hypertension    Morbid obesity with BMI of 50.0-59.9, adult (HCC)    Sleep apnea    Stroke [facility])     Past Surgical History:  Procedure Laterality Date   CATARACT EXTRACTION W/PHACO Right 07/18/2022   Procedure: CATARACT EXTRACTION PHACO AND INTRAOCULAR LENS PLACEMENT (IOC) RIGHT DIABETIC HEALON 5 VISION BLUE 33.79 2:31.3;  Surgeon: Lockie Mola, MD;  Location: [facility];  Service: Ophthalmology;  Laterality: Right;   CESAREAN SECTION      Medications Prior to Admission  Medication Sig Dispense Refill Last Dose   albuterol (VENTOLIN HFA) 108 (90 Base) MCG/ACT inhaler Inhale into the lungs.   03/20/2023   aspirin EC 81 MG tablet Take 81 mg by mouth daily.   03/19/2023   atorvastatin (LIPITOR) 40 MG tablet Take by mouth.   03/19/2023   clopidogrel  (PLAVIX) 75 MG tablet Take 75 mg by mouth daily.   03/19/2023   furosemide (LASIX) 40 MG tablet Take 1.5 tablets (60 mg total) by mouth daily. (Patient taking differently: Take 40 mg by mouth 2 (two) times daily.) 30 tablet 1 03/19/2023   guaiFENesin-dextromethorphan (ROBITUSSIN DM) 100-10 MG/5ML syrup Take 10 mLs by mouth every 6 (six) hours as needed for cough. 118 mL 1 prn at unk   Insulin Aspart FlexPen (NOVOLOG) 100 UNIT/ML Inject 64 Units into the skin in the morning and at bedtime. Before lunch and dinner   03/19/2023   insulin glargine (LANTUS) 100 UNIT/ML injection Inject 100 Units into the skin at bedtime.   03/19/2023   metFORMIN (GLUCOPHAGE) 500 MG tablet Take 1,000 mg by mouth daily with breakfast.   03/19/2023   metFORMIN (GLUCOPHAGE) 500 MG tablet Take 1,000 mg by mouth daily with supper.   03/19/2023   metoprolol succinate (TOPROL-XL) 50 MG 24 hr tablet Take 1 tablet by mouth daily.   03/19/2023   oxybutynin (DITROPAN-XL) 10 MG 24 hr tablet Take 1 tablet by mouth daily.   03/19/2023   sacubitril-valsartan (ENTRESTO) 24-26 MG Take 1 tablet by mouth every 12 (twelve) hours.   03/19/2023   spironolactone (ALDACTONE) 25 MG tablet Take by mouth.   03/19/2023   BD INSULIN SYRINGE U/F 31G X 5/16" 1 ML MISC USE AS DIRECTED ONCE DAILY WITH LEVEMIR (VIAL). (Patient not taking: Reported on 07/09/2022)      Continuous Blood Gluc Sensor (  [facility] CLINIC CARDIOLOGY CONSULT NOTE       Patient ID: Julia Simmons MRN: [id number] DOB/AGE: 1960-12-09 62 y.o.  Admit date: 03/20/2023 Referring Physician Dr. Mikey College Primary Physician Dr. Maurine Minister Primary Cardiologist Dr. Darrold Junker Reason for Consultation NSTEMI  HPI: Julia Simmons is a 62 y.o. female  with a past medical history of coronary artery disease s/p DES to LAD 11/2021, ischemic cardiomyopathy, chronic HFrEF (EF 35-40% 11/2021), R MCA CVA 11/2021, hypertension, hyperlipidemia, type 2 diabetes, OSA on CPAP, morbid obesity who presented to the ED on 03/20/2023 for malaise, dysuria. Cardiology was consulted for further evaluation.   Interval history: -Patient feeling ok this AM, denies any palpitations, chest pain.  -Weaned to room air, denies any shortness of breath. Feels her breathing is much improved. -BP and HR remain stable.  -Plan for TEE today.   Review of systems complete and found to be negative unless listed above    Past Medical History:  Diagnosis Date   Charcot's joint of foot    Coronary artery disease    Diabetes mellitus with neuropathy (HCC)    Diabetic retinopathy (HCC)    Hypertension    Morbid obesity with BMI of 50.0-59.9, adult (HCC)    Sleep apnea    Stroke [facility])     Past Surgical History:  Procedure Laterality Date   CATARACT EXTRACTION W/PHACO Right 07/18/2022   Procedure: CATARACT EXTRACTION PHACO AND INTRAOCULAR LENS PLACEMENT (IOC) RIGHT DIABETIC HEALON 5 VISION BLUE 33.79 2:31.3;  Surgeon: Lockie Mola, MD;  Location: [facility];  Service: Ophthalmology;  Laterality: Right;   CESAREAN SECTION      Medications Prior to Admission  Medication Sig Dispense Refill Last Dose   albuterol (VENTOLIN HFA) 108 (90 Base) MCG/ACT inhaler Inhale into the lungs.   03/20/2023   aspirin EC 81 MG tablet Take 81 mg by mouth daily.   03/19/2023   atorvastatin (LIPITOR) 40 MG tablet Take by mouth.   03/19/2023   clopidogrel  (PLAVIX) 75 MG tablet Take 75 mg by mouth daily.   03/19/2023   furosemide (LASIX) 40 MG tablet Take 1.5 tablets (60 mg total) by mouth daily. (Patient taking differently: Take 40 mg by mouth 2 (two) times daily.) 30 tablet 1 03/19/2023   guaiFENesin-dextromethorphan (ROBITUSSIN DM) 100-10 MG/5ML syrup Take 10 mLs by mouth every 6 (six) hours as needed for cough. 118 mL 1 prn at unk   Insulin Aspart FlexPen (NOVOLOG) 100 UNIT/ML Inject 64 Units into the skin in the morning and at bedtime. Before lunch and dinner   03/19/2023   insulin glargine (LANTUS) 100 UNIT/ML injection Inject 100 Units into the skin at bedtime.   03/19/2023   metFORMIN (GLUCOPHAGE) 500 MG tablet Take 1,000 mg by mouth daily with breakfast.   03/19/2023   metFORMIN (GLUCOPHAGE) 500 MG tablet Take 1,000 mg by mouth daily with supper.   03/19/2023   metoprolol succinate (TOPROL-XL) 50 MG 24 hr tablet Take 1 tablet by mouth daily.   03/19/2023   oxybutynin (DITROPAN-XL) 10 MG 24 hr tablet Take 1 tablet by mouth daily.   03/19/2023   sacubitril-valsartan (ENTRESTO) 24-26 MG Take 1 tablet by mouth every 12 (twelve) hours.   03/19/2023   spironolactone (ALDACTONE) 25 MG tablet Take by mouth.   03/19/2023   BD INSULIN SYRINGE U/F 31G X 5/16" 1 ML MISC USE AS DIRECTED ONCE DAILY WITH LEVEMIR (VIAL). (Patient not taking: Reported on 07/09/2022)      Continuous Blood Gluc Sensor (  without gallops or murmurs.  Abdomen: Non-distended appearing.  Msk: Normal strength and tone for age. Extremities: Warm and well perfused. No clubbing, cyanosis.  Trace edema.  Neuro: Alert and oriented X 3. Psych: Answers questions appropriately.   Labs: Basic Metabolic Panel: Recent Labs    03/24/23 0917 03/25/23 0915  NA 135 139  K 4.4 4.1  CL 102 102  CO2 19* 25  GLUCOSE 262* 296*  BUN 31* 33*  CREATININE 1.30* 1.34*  CALCIUM 8.5* 9.0   Liver Function Tests: No results for input(s): "AST", "ALT", "ALKPHOS", "BILITOT", "PROT", "ALBUMIN" in the last 72 hours.  No results for input(s): "LIPASE", "AMYLASE" in the last 72 hours. CBC: Recent Labs    03/23/23 0627 03/24/23 0401  WBC 6.7 6.3  HGB 10.1* 10.4*  HCT 31.0* 31.5*  MCV 93.7 93.2  PLT 253 270   Cardiac Enzymes: No results for input(s): "CKTOTAL", "CKMB", "CKMBINDEX", "TROPONINIHS" in the last 72 hours.  BNP: No results for input(s): "BNP" in the last 72 hours.  D-Dimer: No results for input(s): "DDIMER" in the last 72 hours. Hemoglobin A1C: No results for input(s): "HGBA1C" in the last 72 hours.  Fasting Lipid Panel: No results for input(s): "CHOL", "HDL", "LDLCALC", "TRIG", "CHOLHDL", "LDLDIRECT" in the last 72 hours. Thyroid Function Tests: No results for input(s): "TSH", "T4TOTAL", "T3FREE", "THYROIDAB" in  the last 72 hours.  Invalid input(s): "FREET3" Anemia Panel: No results for input(s): "VITAMINB12", "FOLATE", "FERRITIN", "TIBC", "IRON", "RETICCTPCT" in the last 72 hours.   Radiology: DG Chest 2 View  Result Date: 03/22/2023 CLINICAL DATA:  Pneumonia EXAM: CHEST - 2 VIEW COMPARISON:  03/21/2023 FINDINGS: Cardiomegaly. Mild, diffuse interstitial opacity similar to prior examination. No focal airspace opacity. The visualized skeletal structures are unremarkable. IMPRESSION: Cardiomegaly with mild, diffuse interstitial opacity similar to prior examination, consistent with edema or atypical/viral infection. No focal airspace opacity. Electronically Signed   By: Jearld Lesch M.D.   On: 03/22/2023 12:47   DG Chest 1 View  Result Date: 03/21/2023 CLINICAL DATA:  62 year old female with history of congestive heart failure. EXAM: CHEST  1 VIEW COMPARISON:  Chest x-ray 03/20/2023. FINDINGS: Vague opacity projecting over the left hemithorax. Right lung appears clear. No right pleural effusion. No pneumothorax. No evidence of pulmonary edema. Heart size is normal. The patient is rotated to the left on today's exam, resulting in distortion of the mediastinal contours and reduced diagnostic sensitivity and specificity for mediastinal pathology. IMPRESSION: 1. Extensive airspace consolidation in the left lung compatible with pneumonia better demonstrated on prior chest CT. Overall, aeration has worsened in the left lung compared to the recent chest x-ray. Electronically Signed   By: Trudie Reed M.D.   On: 03/21/2023 07:22   ECHOCARDIOGRAM COMPLETE  Result Date: 03/20/2023    ECHOCARDIOGRAM REPORT   Patient Name:   Julia Simmons Date of Exam: 03/20/2023 Medical Rec #:  [id number]       Height:       65.0 in Accession #:    [id number]      Weight:       318.0 lb Date of Birth:  [birth year]        BSA:          2.409 m Patient Age:    62 years        BP:           103/78 mmHg Patient Gender: F               HR:  without gallops or murmurs.  Abdomen: Non-distended appearing.  Msk: Normal strength and tone for age. Extremities: Warm and well perfused. No clubbing, cyanosis.  Trace edema.  Neuro: Alert and oriented X 3. Psych: Answers questions appropriately.   Labs: Basic Metabolic Panel: Recent Labs    03/24/23 0917 03/25/23 0915  NA 135 139  K 4.4 4.1  CL 102 102  CO2 19* 25  GLUCOSE 262* 296*  BUN 31* 33*  CREATININE 1.30* 1.34*  CALCIUM 8.5* 9.0   Liver Function Tests: No results for input(s): "AST", "ALT", "ALKPHOS", "BILITOT", "PROT", "ALBUMIN" in the last 72 hours.  No results for input(s): "LIPASE", "AMYLASE" in the last 72 hours. CBC: Recent Labs    03/23/23 0627 03/24/23 0401  WBC 6.7 6.3  HGB 10.1* 10.4*  HCT 31.0* 31.5*  MCV 93.7 93.2  PLT 253 270   Cardiac Enzymes: No results for input(s): "CKTOTAL", "CKMB", "CKMBINDEX", "TROPONINIHS" in the last 72 hours.  BNP: No results for input(s): "BNP" in the last 72 hours.  D-Dimer: No results for input(s): "DDIMER" in the last 72 hours. Hemoglobin A1C: No results for input(s): "HGBA1C" in the last 72 hours.  Fasting Lipid Panel: No results for input(s): "CHOL", "HDL", "LDLCALC", "TRIG", "CHOLHDL", "LDLDIRECT" in the last 72 hours. Thyroid Function Tests: No results for input(s): "TSH", "T4TOTAL", "T3FREE", "THYROIDAB" in  the last 72 hours.  Invalid input(s): "FREET3" Anemia Panel: No results for input(s): "VITAMINB12", "FOLATE", "FERRITIN", "TIBC", "IRON", "RETICCTPCT" in the last 72 hours.   Radiology: DG Chest 2 View  Result Date: 03/22/2023 CLINICAL DATA:  Pneumonia EXAM: CHEST - 2 VIEW COMPARISON:  03/21/2023 FINDINGS: Cardiomegaly. Mild, diffuse interstitial opacity similar to prior examination. No focal airspace opacity. The visualized skeletal structures are unremarkable. IMPRESSION: Cardiomegaly with mild, diffuse interstitial opacity similar to prior examination, consistent with edema or atypical/viral infection. No focal airspace opacity. Electronically Signed   By: Jearld Lesch M.D.   On: 03/22/2023 12:47   DG Chest 1 View  Result Date: 03/21/2023 CLINICAL DATA:  62 year old female with history of congestive heart failure. EXAM: CHEST  1 VIEW COMPARISON:  Chest x-ray 03/20/2023. FINDINGS: Vague opacity projecting over the left hemithorax. Right lung appears clear. No right pleural effusion. No pneumothorax. No evidence of pulmonary edema. Heart size is normal. The patient is rotated to the left on today's exam, resulting in distortion of the mediastinal contours and reduced diagnostic sensitivity and specificity for mediastinal pathology. IMPRESSION: 1. Extensive airspace consolidation in the left lung compatible with pneumonia better demonstrated on prior chest CT. Overall, aeration has worsened in the left lung compared to the recent chest x-ray. Electronically Signed   By: Trudie Reed M.D.   On: 03/21/2023 07:22   ECHOCARDIOGRAM COMPLETE  Result Date: 03/20/2023    ECHOCARDIOGRAM REPORT   Patient Name:   Julia Simmons Date of Exam: 03/20/2023 Medical Rec #:  [id number]       Height:       65.0 in Accession #:    [id number]      Weight:       318.0 lb Date of Birth:  [birth year]        BSA:          2.409 m Patient Age:    62 years        BP:           103/78 mmHg Patient Gender: F               HR:  without gallops or murmurs.  Abdomen: Non-distended appearing.  Msk: Normal strength and tone for age. Extremities: Warm and well perfused. No clubbing, cyanosis.  Trace edema.  Neuro: Alert and oriented X 3. Psych: Answers questions appropriately.   Labs: Basic Metabolic Panel: Recent Labs    03/24/23 0917 03/25/23 0915  NA 135 139  K 4.4 4.1  CL 102 102  CO2 19* 25  GLUCOSE 262* 296*  BUN 31* 33*  CREATININE 1.30* 1.34*  CALCIUM 8.5* 9.0   Liver Function Tests: No results for input(s): "AST", "ALT", "ALKPHOS", "BILITOT", "PROT", "ALBUMIN" in the last 72 hours.  No results for input(s): "LIPASE", "AMYLASE" in the last 72 hours. CBC: Recent Labs    03/23/23 0627 03/24/23 0401  WBC 6.7 6.3  HGB 10.1* 10.4*  HCT 31.0* 31.5*  MCV 93.7 93.2  PLT 253 270   Cardiac Enzymes: No results for input(s): "CKTOTAL", "CKMB", "CKMBINDEX", "TROPONINIHS" in the last 72 hours.  BNP: No results for input(s): "BNP" in the last 72 hours.  D-Dimer: No results for input(s): "DDIMER" in the last 72 hours. Hemoglobin A1C: No results for input(s): "HGBA1C" in the last 72 hours.  Fasting Lipid Panel: No results for input(s): "CHOL", "HDL", "LDLCALC", "TRIG", "CHOLHDL", "LDLDIRECT" in the last 72 hours. Thyroid Function Tests: No results for input(s): "TSH", "T4TOTAL", "T3FREE", "THYROIDAB" in  the last 72 hours.  Invalid input(s): "FREET3" Anemia Panel: No results for input(s): "VITAMINB12", "FOLATE", "FERRITIN", "TIBC", "IRON", "RETICCTPCT" in the last 72 hours.   Radiology: DG Chest 2 View  Result Date: 03/22/2023 CLINICAL DATA:  Pneumonia EXAM: CHEST - 2 VIEW COMPARISON:  03/21/2023 FINDINGS: Cardiomegaly. Mild, diffuse interstitial opacity similar to prior examination. No focal airspace opacity. The visualized skeletal structures are unremarkable. IMPRESSION: Cardiomegaly with mild, diffuse interstitial opacity similar to prior examination, consistent with edema or atypical/viral infection. No focal airspace opacity. Electronically Signed   By: Jearld Lesch M.D.   On: 03/22/2023 12:47   DG Chest 1 View  Result Date: 03/21/2023 CLINICAL DATA:  62 year old female with history of congestive heart failure. EXAM: CHEST  1 VIEW COMPARISON:  Chest x-ray 03/20/2023. FINDINGS: Vague opacity projecting over the left hemithorax. Right lung appears clear. No right pleural effusion. No pneumothorax. No evidence of pulmonary edema. Heart size is normal. The patient is rotated to the left on today's exam, resulting in distortion of the mediastinal contours and reduced diagnostic sensitivity and specificity for mediastinal pathology. IMPRESSION: 1. Extensive airspace consolidation in the left lung compatible with pneumonia better demonstrated on prior chest CT. Overall, aeration has worsened in the left lung compared to the recent chest x-ray. Electronically Signed   By: Trudie Reed M.D.   On: 03/21/2023 07:22   ECHOCARDIOGRAM COMPLETE  Result Date: 03/20/2023    ECHOCARDIOGRAM REPORT   Patient Name:   Julia Simmons Date of Exam: 03/20/2023 Medical Rec #:  [id number]       Height:       65.0 in Accession #:    [id number]      Weight:       318.0 lb Date of Birth:  [birth year]        BSA:          2.409 m Patient Age:    62 years        BP:           103/78 mmHg Patient Gender: F               HR:  without gallops or murmurs.  Abdomen: Non-distended appearing.  Msk: Normal strength and tone for age. Extremities: Warm and well perfused. No clubbing, cyanosis.  Trace edema.  Neuro: Alert and oriented X 3. Psych: Answers questions appropriately.   Labs: Basic Metabolic Panel: Recent Labs    03/24/23 0917 03/25/23 0915  NA 135 139  K 4.4 4.1  CL 102 102  CO2 19* 25  GLUCOSE 262* 296*  BUN 31* 33*  CREATININE 1.30* 1.34*  CALCIUM 8.5* 9.0   Liver Function Tests: No results for input(s): "AST", "ALT", "ALKPHOS", "BILITOT", "PROT", "ALBUMIN" in the last 72 hours.  No results for input(s): "LIPASE", "AMYLASE" in the last 72 hours. CBC: Recent Labs    03/23/23 0627 03/24/23 0401  WBC 6.7 6.3  HGB 10.1* 10.4*  HCT 31.0* 31.5*  MCV 93.7 93.2  PLT 253 270   Cardiac Enzymes: No results for input(s): "CKTOTAL", "CKMB", "CKMBINDEX", "TROPONINIHS" in the last 72 hours.  BNP: No results for input(s): "BNP" in the last 72 hours.  D-Dimer: No results for input(s): "DDIMER" in the last 72 hours. Hemoglobin A1C: No results for input(s): "HGBA1C" in the last 72 hours.  Fasting Lipid Panel: No results for input(s): "CHOL", "HDL", "LDLCALC", "TRIG", "CHOLHDL", "LDLDIRECT" in the last 72 hours. Thyroid Function Tests: No results for input(s): "TSH", "T4TOTAL", "T3FREE", "THYROIDAB" in  the last 72 hours.  Invalid input(s): "FREET3" Anemia Panel: No results for input(s): "VITAMINB12", "FOLATE", "FERRITIN", "TIBC", "IRON", "RETICCTPCT" in the last 72 hours.   Radiology: DG Chest 2 View  Result Date: 03/22/2023 CLINICAL DATA:  Pneumonia EXAM: CHEST - 2 VIEW COMPARISON:  03/21/2023 FINDINGS: Cardiomegaly. Mild, diffuse interstitial opacity similar to prior examination. No focal airspace opacity. The visualized skeletal structures are unremarkable. IMPRESSION: Cardiomegaly with mild, diffuse interstitial opacity similar to prior examination, consistent with edema or atypical/viral infection. No focal airspace opacity. Electronically Signed   By: Jearld Lesch M.D.   On: 03/22/2023 12:47   DG Chest 1 View  Result Date: 03/21/2023 CLINICAL DATA:  62 year old female with history of congestive heart failure. EXAM: CHEST  1 VIEW COMPARISON:  Chest x-ray 03/20/2023. FINDINGS: Vague opacity projecting over the left hemithorax. Right lung appears clear. No right pleural effusion. No pneumothorax. No evidence of pulmonary edema. Heart size is normal. The patient is rotated to the left on today's exam, resulting in distortion of the mediastinal contours and reduced diagnostic sensitivity and specificity for mediastinal pathology. IMPRESSION: 1. Extensive airspace consolidation in the left lung compatible with pneumonia better demonstrated on prior chest CT. Overall, aeration has worsened in the left lung compared to the recent chest x-ray. Electronically Signed   By: Trudie Reed M.D.   On: 03/21/2023 07:22   ECHOCARDIOGRAM COMPLETE  Result Date: 03/20/2023    ECHOCARDIOGRAM REPORT   Patient Name:   Julia Simmons Date of Exam: 03/20/2023 Medical Rec #:  [id number]       Height:       65.0 in Accession #:    [id number]      Weight:       318.0 lb Date of Birth:  [birth year]        BSA:          2.409 m Patient Age:    62 years        BP:           103/78 mmHg Patient Gender: F               HR:

## 2023-03-25 NOTE — Progress Notes (Signed)
Progress Note   Patient: Julia Simmons:[id number] DOB: 1961-02-09 DOA: 03/20/2023     5 DOS: the patient was seen and examined on 03/25/2023   Brief hospital course: Julia Simmons is a 62 y.o. female with medical history significant of CAD with LAD stenting x 2 in 2023, chronic HFrEF and HFpEF with LVEF 35-40% in 2023, HTN, IDDM with insulin resistance, HLD, morbid obesity, OSA on CPAP at bedtime, CKD stage II, frequent UTIs, presented with malaise, dysuria, chills.  Patient also has significant short of breath, hypoxemia of 85%, she was hypotensive at time of admission, blood pressure 76/37, oxygen saturation 85% on room air.  Patient received IV fluid being bolus in the emergency room.   Chest CT scan showed a large pneumonia involving the entire left lung. Blood culture also positive for E. coli. Patient is treated with Rocephin and Zithromax.    Principal Problem:   Severe sepsis (HCC) Active Problems:   PNA (pneumonia)   Diabetes mellitus with neuropathy (HCC)   Hypertension   Morbid obesity with BMI of 50.0-59.9, adult (HCC)   Sepsis secondary to UTI [facility])   CAD S/P percutaneous coronary angioplasty   NSTEMI (non-ST elevated myocardial infarction) (HCC)   E. coli septicemia (HCC)   Chronic systolic CHF (congestive heart failure) (HCC)   Acute hypoxemic respiratory failure (HCC)   E coli bacteremia   Assessment and Plan: Acute respiratory failure with hypoxemia. Left lung pneumonia. Personally reviewed patient chest CT head scan images, patient has extensive pneumonia involving the entire left lung.  This appears to be bacterial, procalcitonin level significantly increased.  Patient developed significant respiratory distress and hypoxemia. Patient does not have significant dysphagia, she vomited the day prior. Repeated chest x-ray 10/4 showed significant improvement in the left lung pneumonia.  Continue Rocephin, completed Zithromax. Patient had improved, off oxygen.   On Rocephin for septicemia.   Severe sepsis secondary to pneumonia. E. coli septicemia. Possible vegetation in aortic valve. E. coli UTI and pyelonephritis. Patient met sepsis criteria with significant tachycardia and tachypnea, additionally, patient has elevated lactic acid, elevated troponin, significant hypotension. Patient blood culture was positive for E. coli, echocardiogram showed possible small vegetation in the aortic valve.  Cardiology is seeing the patient. Source of bacteremia could be from urinary, CT abdomen/pelvis showed evidence of pyelonephritis.  Urine culture also showed E. coli. Continue high-dose Rocephin. CT scan also suspect ureter abnormality with possibility of malignancy.  Outpatient follow-up with urology. TEE will be performed today, will determine the length of antibiotics after TEE.   Non-STEMI. Chronic systolic congestive heart failure. Patient troponin is more than 10,000. Discussed with cardiology, patient will be treated with aspirin and Plavix.  Planning heart cath on Tuesday.  Repeat blood cultures so far negative.    Uncontrolled type 2 diabetes with hyperglycemia. Patient received 60 units of insulin glargine this morning, glucose still running high even with NPO. Medication adjusted to increase to home dose insulin glargine to 100 units, moderate sliding scale insulin and scheduled 5 units of NovoLog.  Continue to follow.   Chronic kidney disease stage IIIb. Metabolic acidosis. Patient renal function appears to be stable at this time. Condition had improved.   Morbid obesity. Obstructive sleep apnea. CPAP while asleep.         Subjective:  Patient doing well today, off oxygen.  Not short of breath.  Physical Exam: Vitals:   03/25/23 0839 03/25/23 1112 03/25/23 1248 03/25/23 1251  BP: 131/69 (!) 156/85 129/75 136/70  Pulse: 71 69 64 65  Resp: 18 13 (!) 25 (!) 29  Temp: 99 F (37.2 C) 98.3 F (36.8 C)    TempSrc: Oral Oral     SpO2: 92% 97% 97% 100%  Weight:  (!) 146.7 kg    Height:  5\' 5"  (1.651 m)     General exam: Appears calm and comfortable  Respiratory system: Clear to auscultation. Respiratory effort normal. Cardiovascular system: S1 & S2 heard, RRR. No JVD, murmurs, rubs, gallops or clicks. No pedal edema. Gastrointestinal system: Abdomen is nondistended, soft and nontender. No organomegaly or masses felt. Normal bowel sounds heard. Central nervous system: Alert and oriented. No focal neurological deficits. Extremities: Symmetric 5 x 5 power. Skin: No rashes, lesions or ulcers Psychiatry: Judgement and insight appear normal. Mood & affect appropriate.    Data Reviewed:  Lab results reviewed.  Family Communication: Son at bedside, updated.  Disposition: Status is: Inpatient Remains inpatient appropriate because: Severity of disease, IV treatment.     Time spent: 35 minutes  Author: Marrion Coy, MD 03/25/2023 1:01 PM  For on call review [email or web address].

## 2023-03-25 NOTE — Progress Notes (Signed)
PULMONOLOGY         Date: 03/25/2023,   MRN# [id number] Julia Simmons [birth year]     AdmissionWeight: (!) 144.2 kg                 CurrentWeight: (!) 146.7 kg  Referring provider: Dr Chipper Herb   CHIEF COMPLAINT:   Multifocal pneumonia with E.coli bacteremia   HISTORY OF PRESENT ILLNESS   This is 62 year old morbidly obese female with a BMI of over 50 who has diabetes with chronic neuropathy, retinopathy and Charcot's foot, severe coronary artery disease status post 2 stents and reports having previous CVA and congestive heart failure, also background history of sleep apnea who came in with shortness of breath cough and chills x 2 days.  She was found to have multifocal pneumonia worse on the left on CT chest imaging with PE protocol.  She did have microbiology including blood culture showing E. coli bacteremia.  Patient is on room air during my evaluation.  She does report acid reflux but denies having aspiration events and denies vomiting.  03/22/23- patient with no acute events overnight.  Net negative 1.5 L and with 2L/min [place]. BMP with improved renal function.   03/23/23- patient feeling better this am, husband at bedside.  She's on 1.5-2L/min in no distress.  She's ambulatory in room going to bathroom.  She is net 1.6L negative.  Has cardiac workup in progess.  Remains on zithromax rocephin.  03/25/23- patient had TEE today, she's a bit sedated still during my evaluation.  Repeating CXR today for interval changes. On rocephin for ecoli pneumonia.  LE edema is worse this am then 2 days ago.   PAST MEDICAL HISTORY   Past Medical History:  Diagnosis Date   Charcot's joint of foot    Coronary artery disease    Diabetes mellitus with neuropathy (HCC)    Diabetic retinopathy (HCC)    Hypertension    Morbid obesity with BMI of 50.0-59.9, adult (HCC)    Sleep apnea    Stroke [facility])      SURGICAL HISTORY   Past Surgical History:  Procedure Laterality Date   CATARACT  EXTRACTION W/PHACO Right 07/18/2022   Procedure: CATARACT EXTRACTION PHACO AND INTRAOCULAR LENS PLACEMENT (IOC) RIGHT DIABETIC HEALON 5 VISION BLUE 33.79 2:31.3;  Surgeon: Lockie Mola, MD;  Location: [facility];  Service: Ophthalmology;  Laterality: Right;   CESAREAN SECTION       FAMILY HISTORY   Family History  Problem Relation Age of Onset   Breast cancer Neg Hx      SOCIAL HISTORY   Social History   Tobacco Use   Smoking status: Never   Smokeless tobacco: Never  Vaping Use   Vaping status: Never Used  Substance Use Topics   Alcohol use: Not Currently   Drug use: Never     MEDICATIONS    Home Medication:    Current Medication:  Current Facility-Administered Medications:    acetaminophen (TYLENOL) tablet 650 mg, 650 mg, Oral, Q6H PRN, Mikey College T, MD, 650 mg at 03/21/23 0039   albuterol (PROVENTIL) (2.5 MG/3ML) 0.083% nebulizer solution 3 mL, 3 mL, Nebulization, Q4H PRN, Mikey College T, MD   aspirin EC tablet 81 mg, 81 mg, Oral, Daily, Mikey College T, MD, 81 mg at 03/24/23 0851   atorvastatin (LIPITOR) tablet 40 mg, 40 mg, Oral, Daily, Mikey College T, MD, 40 mg at 03/24/23 0851   bacitracin ointment, , Topical, BID, Marrion Coy, MD, Given  at 03/24/23 1800   budesonide (PULMICORT) nebulizer solution 0.5 mg, 0.5 mg, Nebulization, Q12H, Marrion Coy, MD, 0.5 mg at 03/25/23 0738   cefTRIAXone (ROCEPHIN) 2 g in sodium chloride 0.9 % 100 mL IVPB, 2 g, Intravenous, Q24H, Marrion Coy, MD, Last Rate: 200 mL/hr at 03/24/23 0906, 2 g at 03/24/23 0906   clopidogrel (PLAVIX) tablet 75 mg, 75 mg, Oral, Daily, Mikey College T, MD, 75 mg at 03/24/23 0851   furosemide (LASIX) tablet 40 mg, 40 mg, Oral, BID, Hudson, Caralyn, PA-C, 40 mg at 03/24/23 1735   guaiFENesin-dextromethorphan (ROBITUSSIN DM) 100-10 MG/5ML syrup 10 mL, 10 mL, Oral, Q6H PRN, Mikey College T, MD   insulin aspart (novoLOG) injection 0-20 Units, 0-20 Units, Subcutaneous, TID WC, Emeline General, MD, 20  Units at 03/24/23 1735   insulin aspart (novoLOG) injection 0-5 Units, 0-5 Units, Subcutaneous, QHS, Mikey College T, MD, 3 Units at 03/24/23 2053   insulin glargine-yfgn (SEMGLEE) injection 60 Units, 60 Units, Subcutaneous, Daily, Marrion Coy, MD   ipratropium-albuterol (DUONEB) 0.5-2.5 (3) MG/3ML nebulizer solution 3 mL, 3 mL, Nebulization, BID, Marrion Coy, MD, 3 mL at 03/25/23 0738   linagliptin (TRADJENTA) tablet 5 mg, 5 mg, Oral, Daily, Mikey College T, MD, 5 mg at 03/24/23 0851   metoprolol succinate (TOPROL-XL) 24 hr tablet 25 mg, 25 mg, Oral, BID, Hudson, Caralyn, PA-C, 25 mg at 03/24/23 2053   ondansetron (ZOFRAN) injection 4 mg, 4 mg, Intravenous, Q6H PRN, Mikey College T, MD, 4 mg at 03/21/23 1737   oxybutynin (DITROPAN-XL) 24 hr tablet 10 mg, 10 mg, Oral, Daily, Mikey College T, MD, 10 mg at 03/24/23 8469   sacubitril-valsartan (ENTRESTO) 24-26 mg per tablet, 1 tablet, Oral, BID, Alluri, Meryl Dare, MD, 1 tablet at 03/24/23 2052   sodium bicarbonate tablet 650 mg, 650 mg, Oral, BID, Marrion Coy, MD, 650 mg at 03/24/23 2052   sodium chloride flush (NS) 0.9 % injection 3 mL, 3 mL, Intravenous, Q12H, Callwood, Dwayne D, MD, 3 mL at 03/24/23 2233    ALLERGIES   Hydrochlorothiazide and Sulfa antibiotics     REVIEW OF SYSTEMS    Review of Systems:  Gen:  Denies  fever, sweats, chills weigh loss  HEENT: Denies blurred vision, double vision, ear pain, eye pain, hearing loss, nose bleeds, sore throat Cardiac:  No dizziness, chest pain or heaviness, chest tightness,edema Resp:   reports dyspnea chronically  Gi: Denies swallowing difficulty, stomach pain, nausea or vomiting, diarrhea, constipation, bowel incontinence Gu:  Denies bladder incontinence, burning urine Ext:   Denies Joint pain, stiffness or swelling Skin: Denies  skin rash, easy bruising or bleeding or hives Endoc:  Denies polyuria, polydipsia , polyphagia or weight change Psych:   Denies depression, insomnia or  hallucinations   Other:  All other systems negative   VS: BP (!) 126/58 (BP Location: Right Wrist)   Pulse 67   Temp (!) 97.5 F (36.4 C) (Axillary)   Resp 20   Ht 5\' 5"  (1.651 m)   Wt (!) 146.7 kg   SpO2 96%   BMI 53.82 kg/m      PHYSICAL EXAM    GENERAL:NAD, no fevers, chills, no weakness no fatigue HEAD: Normocephalic, atraumatic.  EYES: Pupils equal, round, reactive to light. Extraocular muscles intact. No scleral icterus.  MOUTH: Moist mucosal membrane. Dentition intact. No abscess noted.  EAR, NOSE, THROAT: Clear without exudates. No external lesions.  NECK: Supple. No thyromegaly. No nodules. No JVD.  PULMONARY: decreased breath sounds with mild rhonchi  worse at bases bilaterally.  CARDIOVASCULAR: S1 and S2. Regular rate and rhythm. No murmurs, rubs, or gallops. No edema. Pedal pulses 2+ bilaterally.  GASTROINTESTINAL: Soft, nontender, nondistended. No masses. Positive bowel sounds. No hepatosplenomegaly.  MUSCULOSKELETAL: No swelling, clubbing, or edema. Range of motion full in all extremities.  NEUROLOGIC: Cranial nerves II through XII are intact. No gross focal neurological deficits. Sensation intact. Reflexes intact.  SKIN: No ulceration, lesions, rashes, or cyanosis. Skin warm and dry. Turgor intact.  PSYCHIATRIC: Mood, affect within normal limits. The patient is awake, alert and oriented x 3. Insight, judgment intact.       IMAGING   \  ASSESSMENT/PLAN   Multifocal pneumonia -suspecti due to e.coli - present on admission  - COVID19 negative  - supplemental O2 during my evaluation room air -reviewed pertinent imaging with patient today -PT/OT for d/c planning  -please encourage patient to use incentive spirometer few times each hour while hospitalized.       Ecoli bacteremia and pneumonia   - patient without shock physiology     - ID consultation - appreciate input    - continue antibiotics , currently rocephin     OSA    Continue home CPAP  with settings as per RT   - orders placed    Thank you for allowing me to participate in the care of this patient.   Patient/Family are satisfied with care plan and all questions have been answered.    Provider disclosure: Patient with at least one acute or chronic illness or injury that poses a threat to life or bodily function and is being managed actively during this encounter.  All of the below services have been performed independently by signing provider:  review of prior documentation from internal and or external health records.  Review of previous and current lab results.  Interview and comprehensive assessment during patient visit today. Review of current and previous chest radiographs/CT scans. Discussion of management and test interpretation with health care team and patient/family.   This document was prepared using Dragon voice recognition software and may include unintentional dictation errors.     Vida Rigger, M.D.  Division of Pulmonary & Critical Care Medicine

## 2023-03-26 ENCOUNTER — Encounter: Payer: Self-pay | Admitting: Cardiology

## 2023-03-26 ENCOUNTER — Encounter
Admission: EM | Disposition: A | Discharge status: Home Health | Payer: Self-pay | Source: Home / Self Care | Attending: Internal Medicine

## 2023-03-26 DIAGNOSIS — A419 Sepsis, unspecified organism: Secondary | ICD-10-CM | POA: Diagnosis not present

## 2023-03-26 DIAGNOSIS — I214 Non-ST elevation (NSTEMI) myocardial infarction: Secondary | ICD-10-CM | POA: Diagnosis not present

## 2023-03-26 DIAGNOSIS — A4151 Sepsis due to Escherichia coli [E. coli]: Secondary | ICD-10-CM | POA: Diagnosis not present

## 2023-03-26 DIAGNOSIS — I10 Essential (primary) hypertension: Secondary | ICD-10-CM

## 2023-03-26 DIAGNOSIS — J9601 Acute respiratory failure with hypoxia: Secondary | ICD-10-CM | POA: Diagnosis not present

## 2023-03-26 HISTORY — PX: LEFT HEART CATH AND CORONARY ANGIOGRAPHY: CATH118249

## 2023-03-26 HISTORY — PX: CORONARY BALLOON ANGIOPLASTY: CATH118233

## 2023-03-26 LAB — CBC
HCT: 35.3 % — ABNORMAL LOW (ref 36.0–46.0)
Hemoglobin: 11.6 g/dL — ABNORMAL LOW (ref 12.0–15.0)
MCH: 30.4 pg (ref 26.0–34.0)
MCHC: 32.9 g/dL (ref 30.0–36.0)
MCV: 92.7 fL (ref 80.0–100.0)
Platelets: 343 10*3/uL (ref 150–400)
RBC: 3.81 MIL/uL — ABNORMAL LOW (ref 3.87–5.11)
RDW: 13.6 % (ref 11.5–15.5)
WBC: 7 10*3/uL (ref 4.0–10.5)
nRBC: 0 % (ref 0.0–0.2)

## 2023-03-26 LAB — GLUCOSE, CAPILLARY
Glucose-Capillary: 214 mg/dL — ABNORMAL HIGH (ref 70–99)
Glucose-Capillary: 228 mg/dL — ABNORMAL HIGH (ref 70–99)
Glucose-Capillary: 215 mg/dL — ABNORMAL HIGH (ref 70–99)
Glucose-Capillary: 183 mg/dL — ABNORMAL HIGH (ref 70–99)
Glucose-Capillary: 245 mg/dL — ABNORMAL HIGH (ref 70–99)
Glucose-Capillary: 250 mg/dL — ABNORMAL HIGH (ref 70–99)

## 2023-03-26 LAB — BASIC METABOLIC PANEL
Anion gap: 10 (ref 5–15)
BUN: 35 mg/dL — ABNORMAL HIGH (ref 6–20)
CO2: 26 mmol/L (ref 22–32)
Calcium: 8.8 mg/dL — ABNORMAL LOW (ref 8.9–10.3)
Chloride: 103 mmol/L (ref 98–111)
Creatinine, Ser: 1.27 mg/dL — ABNORMAL HIGH (ref 0.44–1.00)
GFR, Estimated: 48 mL/min — ABNORMAL LOW (ref >60)
Glucose, Bld: 237 mg/dL — ABNORMAL HIGH (ref 70–99)
Potassium: 3.7 mmol/L (ref 3.5–5.1)
Sodium: 139 mmol/L (ref 135–145)

## 2023-03-26 SURGERY — LEFT HEART CATH AND CORONARY ANGIOGRAPHY
Anesthesia: Moderate Sedation

## 2023-03-26 MED ORDER — NITROGLYCERIN 1 MG/10 ML FOR IR/CATH LAB
INTRA_ARTERIAL | Status: AC
  Filled 2023-03-26: qty 10

## 2023-03-26 MED ORDER — FENTANYL CITRATE (PF) 100 MCG/2ML IJ SOLN
INTRAMUSCULAR | Status: DC | PRN
  Administered 2023-03-26: 25 ug via INTRAVENOUS

## 2023-03-26 MED ORDER — SODIUM CHLORIDE 0.9% FLUSH: 3.0000 mL | INTRAVENOUS | Status: DC | PRN

## 2023-03-26 MED ORDER — CLOPIDOGREL BISULFATE 75 MG PO TABS
ORAL_TABLET | ORAL | Status: AC
  Filled 2023-03-26: qty 3

## 2023-03-26 MED ORDER — LABETALOL HCL 5 MG/ML IV SOLN: 10.0000 mg | INTRAVENOUS | Status: AC | PRN

## 2023-03-26 MED ORDER — FUROSEMIDE 10 MG/ML IJ SOLN
INTRAMUSCULAR | Status: AC
  Filled 2023-03-26: qty 4

## 2023-03-26 MED ORDER — SODIUM CHLORIDE 0.9 % IV SOLN: 250.0000 mL | INTRAVENOUS | Status: DC | PRN

## 2023-03-26 MED ORDER — VERAPAMIL HCL 2.5 MG/ML IV SOLN
INTRAVENOUS | Status: DC | PRN
  Administered 2023-03-26: 2.5 mg via INTRAVENOUS

## 2023-03-26 MED ORDER — VERAPAMIL HCL 2.5 MG/ML IV SOLN
INTRAVENOUS | Status: AC
  Filled 2023-03-26: qty 2

## 2023-03-26 MED ORDER — FENTANYL CITRATE (PF) 100 MCG/2ML IJ SOLN
INTRAMUSCULAR | Status: AC
  Filled 2023-03-26: qty 2

## 2023-03-26 MED ORDER — ENOXAPARIN SODIUM 80 MG/0.8ML IJ SOSY
0.5000 mg/kg | PREFILLED_SYRINGE | INTRAMUSCULAR | Status: DC
  Administered 2023-03-26: 70 mg via SUBCUTANEOUS
  Filled 2023-03-26: qty 0.8

## 2023-03-26 MED ORDER — ONDANSETRON HCL 4 MG/2ML IJ SOLN: 4.0000 mg | Freq: Four times a day (QID) | INTRAMUSCULAR | Status: DC | PRN

## 2023-03-26 MED ORDER — SODIUM CHLORIDE 0.9 % WEIGHT BASED INFUSION
1.0000 mL/kg/h | INTRAVENOUS | Status: AC
  Administered 2023-03-26: 1 mL/kg/h via INTRAVENOUS

## 2023-03-26 MED ORDER — HEPARIN SODIUM (PORCINE) 1000 UNIT/ML IJ SOLN
INTRAMUSCULAR | Status: AC
  Filled 2023-03-26: qty 10

## 2023-03-26 MED ORDER — ACETAMINOPHEN 325 MG PO TABS: 650.0000 mg | ORAL_TABLET | ORAL | Status: DC | PRN

## 2023-03-26 MED ORDER — INSULIN ASPART 100 UNIT/ML IJ SOLN
10.0000 [IU] | Freq: Three times a day (TID) | INTRAMUSCULAR | Status: DC
  Administered 2023-03-26 – 2023-03-27 (×2): 10 [IU] via SUBCUTANEOUS
  Filled 2023-03-26 (×2): qty 1

## 2023-03-26 MED ORDER — ASPIRIN 81 MG PO CHEW
CHEWABLE_TABLET | ORAL | Status: DC | PRN
  Administered 2023-03-26: 243 mg via ORAL

## 2023-03-26 MED ORDER — SODIUM CHLORIDE 0.9% FLUSH
3.0000 mL | Freq: Two times a day (BID) | INTRAVENOUS | Status: DC
  Administered 2023-03-27 (×2): 3 mL via INTRAVENOUS

## 2023-03-26 MED ORDER — HEPARIN (PORCINE) IN NACL 1000-0.9 UT/500ML-% IV SOLN
INTRAVENOUS | Status: DC | PRN
  Administered 2023-03-26 (×2): 500 mL

## 2023-03-26 MED ORDER — HYDRALAZINE HCL 20 MG/ML IJ SOLN: 10.0000 mg | INTRAMUSCULAR | Status: AC | PRN

## 2023-03-26 MED ORDER — HEPARIN SODIUM (PORCINE) 1000 UNIT/ML IJ SOLN
INTRAMUSCULAR | Status: DC | PRN
  Administered 2023-03-26 (×2): 6000 [IU] via INTRAVENOUS

## 2023-03-26 MED ORDER — CLOPIDOGREL BISULFATE 75 MG PO TABS
ORAL_TABLET | ORAL | Status: DC | PRN
  Administered 2023-03-26: 225 mg via ORAL

## 2023-03-26 MED ORDER — MIDAZOLAM HCL 2 MG/2ML IJ SOLN
INTRAMUSCULAR | Status: AC
  Filled 2023-03-26: qty 2

## 2023-03-26 MED ORDER — IOHEXOL 300 MG/ML  SOLN
INTRAMUSCULAR | Status: DC | PRN
  Administered 2023-03-26: 180 mL

## 2023-03-26 MED ORDER — MIDAZOLAM HCL 2 MG/2ML IJ SOLN
INTRAMUSCULAR | Status: DC | PRN
  Administered 2023-03-26: 1 mg via INTRAVENOUS

## 2023-03-26 MED ORDER — FUROSEMIDE 40 MG PO TABS
40.0000 mg | ORAL_TABLET | Freq: Two times a day (BID) | ORAL | Status: DC
  Administered 2023-03-27: 40 mg via ORAL
  Filled 2023-03-26 (×2): qty 1

## 2023-03-26 MED ORDER — FUROSEMIDE 10 MG/ML IJ SOLN
INTRAMUSCULAR | Status: DC | PRN
  Administered 2023-03-26: 40 mg via INTRAVENOUS

## 2023-03-26 SURGICAL SUPPLY — 20 items
CATH 5FR JL3.5 JR4 ANG PIG MP (CATHETERS) IMPLANT
CATH INFINITI 5 FR 3DRC (CATHETERS) IMPLANT
CATH INFINITI 5FR JL4 (CATHETERS) IMPLANT
CATH VISTA GUIDE 6FR XB3.5 (CATHETERS) IMPLANT
DEVICE RAD TR BAND REGULAR (VASCULAR PRODUCTS) IMPLANT
DRAPE BRACHIAL (DRAPES) IMPLANT
GLIDESHEATH SLEND SS 6F .021 (SHEATH) IMPLANT
GUIDEWIRE INQWIRE 1.5J.035X260 (WIRE) IMPLANT
INQWIRE 1.5J .035X260CM (WIRE) ×1
KIT ENCORE 26 ADVANTAGE (KITS) IMPLANT
PACK CARDIAC CATH (CUSTOM PROCEDURE TRAY) ×1 IMPLANT
PANNUS RETENTION SYSTEM 2 PAD (MISCELLANEOUS) IMPLANT
PROTECTION STATION PRESSURIZED (MISCELLANEOUS) ×1
SET ATX-X65L (MISCELLANEOUS) IMPLANT
STATION PROTECTION PRESSURIZED (MISCELLANEOUS) IMPLANT
TUBING CIL FLEX 10 FLL-RA (TUBING) IMPLANT
WIRE ASAHI PROWATER 180CM (WIRE) IMPLANT
WIRE EMERALD 3MM-J .035X150CM (WIRE) IMPLANT
WIRE HI TORQ WHISPER MS 190CM (WIRE) IMPLANT
WIRE RUNTHROUGH IZANAI 014 180 (WIRE) IMPLANT

## 2023-03-26 NOTE — Progress Notes (Signed)
[facility] HF Stewardship  PCP: Patrice Paradise, MD  PCP-Cardiologist: None  HPI: Julia Simmons is a 62 y.o. female with hypertension, hyperlipidemia, type 2 diabetes, OSA on CPAP, morbid obesity, CAD s/p DES to LAD 11/2021, ischemic cardiomyopathy, chronic HFrEF (EF 35-40% 11/2021), R MCA CVA 11/2021, who presented with general malaise, dysuria, and chills.  Troponin was 507 on admission, trending up to a peak of 10,699 before trending back down. Suspected type I MI, however may also be type II MI. BNP normal on admission. Lactate of 2.4 on admission. PCT markedly elevated at 24. CTA negative for PE but suggestive of multifocal pneumonia. Bcx showed E. coli bacteremia.   Pertinent cardiac history: TTE in 05/2020 with LVEF of 60-65%, G2DD, normal RV. Cardiac cath at [facility] in 11/2021 showed LAD stenosis treated with DES to the proximal and mid segments requiring Shockwave lithotripsy. Noted ot have 75% stenosis of the RCA at the time. TTE 03/20/23 with LVEF down to 25-30%, G1DD, mild MR, trivial Tr, and small mobile echogenicity noted on TTE. Cardiology ordered TEE along with LHC.  Pertinent Lab Values: Creatinine, Ser  Date Value Ref Range Status  03/26/2023 1.27 (H) 0.44 - 1.00 mg/dL Final   BUN  Date Value Ref Range Status  03/26/2023 35 (H) 6 - 20 mg/dL Final   Potassium  Date Value Ref Range Status  03/26/2023 3.7 3.5 - 5.1 mmol/L Final   Sodium  Date Value Ref Range Status  03/26/2023 139 135 - 145 mmol/L Final   B Natriuretic Peptide  Date Value Ref Range Status  03/20/2023 59.0 0.0 - 100.0 pg/mL Final    Comment:    Performed at [facility], [address] [place], [place] [address]   Magnesium  Date Value Ref Range Status  06/15/2020 1.8 1.7 - 2.4 mg/dL Final    Comment:    Performed at [facility], [address] [place], [place] [address]   Hgb A1c MFr Bld  Date Value Ref Range Status  03/20/2023 8.3 (H) 4.8 - 5.6 % Final    Comment:     (NOTE) Pre diabetes:          5.7%-6.4%  Diabetes:              >6.4%  Glycemic control for   <7.0% adults with diabetes     Vital Signs: Temp:  [97.7 F (36.5 C)-99 F (37.2 C)] 97.7 F (36.5 C) (10/08 0742) Pulse Rate:  [59-75] 59 (10/08 0742) Cardiac Rhythm: Normal sinus rhythm (10/08 0700) Resp:  [13-30] 18 (10/08 0336) BP: (118-156)/(63-85) 137/78 (10/08 0742) SpO2:  [89 %-100 %] 97 % (10/08 0742) Weight:  [141.5 kg (311 lb 14.4 oz)-146.7 kg (323 lb 6.6 oz)] 141.8 kg (312 lb 11.2 oz) (10/08 0336)  Intake/Output Summary (Last 24 hours) at 03/26/2023 0817 Last data filed at 03/25/2023 1813 Gross per 24 hour  Intake 183.49 ml  Output --  Net 183.49 ml   Current Inpatient Medications:  -Metoprolol succinate 25 mg BID -Entresto 24-26 mg BID -Furosemide 40 mg BID  Prior to admission HF Medications:  -Metoprolol succinate 50 mg daily -Entresto 24-26 mg BID -Spironolactone 25 mg daily -Jardiance 25 mg daily -Furosemide 40 mg BID  Assessment: 1. 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 NYHA class II-III symptoms.  -BNP on admission was normal, creatinine/BUN improved after holding diuretics (was dry prior to admission on furosemide 40 mg BID in  the setting of sepsis). With multiple IV and PO fluids and creatinine improving, oral diuretics were resumed. Weight documented down 2 lbs from admission. Patient denies shortness of breath or swelling. Difficult to assess LEE given body habitus. -BP soft on admission, but now elevated in the 130-140s/70s. If BP remains elevated, would recommend increasing Entresto after cath. -Not a good candidate for resuming Jardiance at discharge given multiple UTIs including pyelonephritis. -Will follow plans for cardiac cath/TEE this week.  Plan: 1) Medication changes recommended at this time: -Consider increasing Entresto to 49-51 mg BID after cath if creatinine stable.  2)  Patient assistance: -None required  3) Education: - Patient has been educated on current HF medications and potential additions to HF medication regimen - Patient verbalizes understanding that over the next few months, these medication doses may change and more medications may be added to optimize HF regimen - Patient has been educated on basic disease state pathophysiology and goals of therapy  Medication Assistance / Insurance Benefits Check:  Does the patient have prescription insurance? Prescription Insurance: [employer] ([employer])  Type of insurance plan:   Does the patient qualify for medication assistance through manufacturers or grants? No  Outpatient Pharmacy:  Prior to admission [facility] Is the patient agreeable to switch to [facility]?: Yes   Please do not hesitate to reach out with questions or concerns,  Enos Fling, PharmD, BCPS Heart Failure Pharmacist Phone - [phone] 03/26/2023 8:17 AM

## 2023-03-26 NOTE — Plan of Care (Signed)
Problem: Education: Goal: Knowledge of disease or condition will improve Outcome: Progressing Goal: Knowledge of the prescribed therapeutic regimen will improve Outcome: Progressing Goal: Individualized Educational Video(s) Outcome: Progressing   Problem: Activity: Goal: Ability to tolerate increased activity will improve Outcome: Progressing Goal: Will verbalize the importance of balancing activity with adequate rest periods Outcome: Progressing   Problem: Respiratory: Goal: Ability to maintain a clear airway will improve Outcome: Progressing Goal: Levels of oxygenation will improve Outcome: Progressing Goal: Ability to maintain adequate ventilation will improve Outcome: Progressing   Problem: Activity: Goal: Ability to tolerate increased activity will improve Outcome: Progressing   Problem: Clinical Measurements: Goal: Ability to maintain a body temperature in the normal range will improve Outcome: Progressing   Problem: Respiratory: Goal: Ability to maintain adequate ventilation will improve Outcome: Progressing Goal: Ability to maintain a clear airway will improve Outcome: Progressing   Problem: Education: Goal: Ability to describe self-care measures that may prevent or decrease complications (Diabetes Survival Skills Education) will improve Outcome: Progressing Goal: Individualized Educational Video(s) Outcome: Progressing   Problem: Coping: Goal: Ability to adjust to condition or change in health will improve Outcome: Progressing   Problem: Fluid Volume: Goal: Ability to maintain a balanced intake and output will improve Outcome: Progressing   Problem: Health Behavior/Discharge Planning: Goal: Ability to identify and utilize available resources and services will improve Outcome: Progressing Goal: Ability to manage health-related needs will improve Outcome: Progressing   Problem: Metabolic: Goal: Ability to maintain appropriate glucose levels will  improve Outcome: Progressing   Problem: Nutritional: Goal: Maintenance of adequate nutrition will improve Outcome: Progressing Goal: Progress toward achieving an optimal weight will improve Outcome: Progressing   Problem: Skin Integrity: Goal: Risk for impaired skin integrity will decrease Outcome: Progressing   Problem: Tissue Perfusion: Goal: Adequacy of tissue perfusion will improve Outcome: Progressing   Problem: Education: Goal: Knowledge of General Education information will improve Description: Including pain rating scale, medication(s)/side effects and non-pharmacologic comfort measures Outcome: Progressing   Problem: Health Behavior/Discharge Planning: Goal: Ability to manage health-related needs will improve Outcome: Progressing   Problem: Clinical Measurements: Goal: Ability to maintain clinical measurements within normal limits will improve Outcome: Progressing Goal: Will remain free from infection Outcome: Progressing Goal: Diagnostic test results will improve Outcome: Progressing Goal: Respiratory complications will improve Outcome: Progressing Goal: Cardiovascular complication will be avoided Outcome: Progressing   Problem: Activity: Goal: Risk for activity intolerance will decrease Outcome: Progressing   Problem: Nutrition: Goal: Adequate nutrition will be maintained Outcome: Progressing   Problem: Coping: Goal: Level of anxiety will decrease Outcome: Progressing   Problem: Elimination: Goal: Will not experience complications related to bowel motility Outcome: Progressing Goal: Will not experience complications related to urinary retention Outcome: Progressing   Problem: Pain Managment: Goal: General experience of comfort will improve Outcome: Progressing   Problem: Safety: Goal: Ability to remain free from injury will improve Outcome: Progressing   Problem: Skin Integrity: Goal: Risk for impaired skin integrity will decrease Outcome:  Progressing   Problem: Education: Goal: Understanding of CV disease, CV risk reduction, and recovery process will improve Outcome: Progressing Goal: Individualized Educational Video(s) Outcome: Progressing   Problem: Activity: Goal: Ability to return to baseline activity level will improve Outcome: Progressing   Problem: Cardiovascular: Goal: Ability to achieve and maintain adequate cardiovascular perfusion will improve Outcome: Progressing Goal: Vascular access site(s) Level 0-1 will be maintained Outcome: Progressing   Problem: Health Behavior/Discharge Planning: Goal: Ability to safely manage health-related needs after discharge will improve Outcome: Progressing

## 2023-03-26 NOTE — Treatment Plan (Signed)
Diagnosis: E. Coli bacteremia UTI pneumonia Baseline Creatinine 1.30    Allergies  Allergen Reactions   Hydrochlorothiazide Swelling    Facial swelling   Sulfa Antibiotics Swelling    Facial swelling    OPAT Orders Discharge antibiotics: Ceftriaxone 2 grams IV every 24 hours  End Date: 03/30/23   [facility] Care Per Protocol:  Labs weekly while on IV antibiotics: _X_ CBC with differential  _X_ CMP   _X_ Please pull PIC at completion of IV antibiotics   Fax weekly lab results  promptly to [phone]  Clinic Follow Up Appt: None needed with Dr.Euclide Granito  Call [phone] with any concerns or questions

## 2023-03-26 NOTE — Progress Notes (Signed)
and tone for age. Extremities: Warm and well perfused. No clubbing, cyanosis.  Trace edema.  Neuro: Alert and oriented X 3. Psych: Answers questions appropriately.   Labs: Basic Metabolic Panel: Recent Labs    03/25/23 0915 03/26/23 0409  NA 139 139  K 4.1 3.7  CL 102 103  CO2 25 26  GLUCOSE 296* 237*  BUN 33* 35*  CREATININE 1.34* 1.27*  CALCIUM 9.0 8.8*   Liver Function Tests: No results for input(s): "AST", "ALT", "ALKPHOS", "BILITOT", "PROT", "ALBUMIN" in the last 72 hours.  No results for input(s): "LIPASE", "AMYLASE" in the last 72 hours. CBC: Recent Labs    03/24/23 0401 03/26/23 0409  WBC 6.3 7.0  HGB 10.4* 11.6*  HCT 31.5* 35.3*  MCV 93.2 92.7  PLT 270 343   Cardiac Enzymes: No results for input(s): "CKTOTAL", "CKMB", "CKMBINDEX", "TROPONINIHS" in the last 72 hours.  BNP: No results for input(s): "BNP" in the last 72 hours.  D-Dimer: No results for input(s): "DDIMER" in the last 72 hours. Hemoglobin A1C: No results for input(s): "HGBA1C" in the last 72 hours.  Fasting Lipid Panel: No results for input(s): "CHOL", "HDL", "LDLCALC", "TRIG", "CHOLHDL", "LDLDIRECT" in the last 72 hours. Thyroid Function Tests: No results for input(s): "TSH", "T4TOTAL", "T3FREE", "THYROIDAB" in the last 72 hours.  Invalid input(s): "FREET3" Anemia Panel: No results for  input(s): "VITAMINB12", "FOLATE", "FERRITIN", "TIBC", "IRON", "RETICCTPCT" in the last 72 hours.   Radiology: ECHO TEE  Result Date: 03/25/2023    TRANSESOPHOGEAL ECHO REPORT   Patient Name:   Julia Simmons Date of Exam: 03/25/2023 Medical Rec #:  [id number]       Height:       65.0 in Accession #:    [id number]      Weight:       323.4 lb Date of Birth:  1961-05-26        BSA:          2.426 m Patient Age:    62 years        BP:           129/75 mmHg Patient Gender: F               HR:           64 bpm. Exam Location:  [facility] Procedure: Transesophageal Echo, Cardiac Doppler and Color Doppler Indications:     SBE (subacute bacterial endocarditis) I33.0  History:         Patient has prior history of Echocardiogram examinations, most                  recent 03/20/2023. Risk Factors:Hypertension and Diabetes.  Sonographer:     Cristela Blue Referring Phys:  [id number] DWAYNE D CALLWOOD Diagnosing Phys: Windell Norfolk PROCEDURE: The transesophogeal probe was passed without difficulty through the esophogus of the patient. Local oropharyngeal anesthetic was provided with Cetacaine. Sedation performed by different physician. Image quality was excellent. The patient developed no complications during the procedure. Sedation provided by anesthesiologist.  IMPRESSIONS  1. Left ventricular ejection fraction, by estimation, is 25 to 30%. The left ventricle has severely decreased function.  2. Right ventricular systolic function is normal. The right ventricular size is normal.  3. No left atrial/left atrial appendage thrombus was detected.  4. The mitral valve is normal in structure. Mild mitral valve regurgitation.  5. No valvular vegetation. The aortic valve is tricuspid. There is mild calcification of the aortic valve. There is mild thickening of the  and tone for age. Extremities: Warm and well perfused. No clubbing, cyanosis.  Trace edema.  Neuro: Alert and oriented X 3. Psych: Answers questions appropriately.   Labs: Basic Metabolic Panel: Recent Labs    03/25/23 0915 03/26/23 0409  NA 139 139  K 4.1 3.7  CL 102 103  CO2 25 26  GLUCOSE 296* 237*  BUN 33* 35*  CREATININE 1.34* 1.27*  CALCIUM 9.0 8.8*   Liver Function Tests: No results for input(s): "AST", "ALT", "ALKPHOS", "BILITOT", "PROT", "ALBUMIN" in the last 72 hours.  No results for input(s): "LIPASE", "AMYLASE" in the last 72 hours. CBC: Recent Labs    03/24/23 0401 03/26/23 0409  WBC 6.3 7.0  HGB 10.4* 11.6*  HCT 31.5* 35.3*  MCV 93.2 92.7  PLT 270 343   Cardiac Enzymes: No results for input(s): "CKTOTAL", "CKMB", "CKMBINDEX", "TROPONINIHS" in the last 72 hours.  BNP: No results for input(s): "BNP" in the last 72 hours.  D-Dimer: No results for input(s): "DDIMER" in the last 72 hours. Hemoglobin A1C: No results for input(s): "HGBA1C" in the last 72 hours.  Fasting Lipid Panel: No results for input(s): "CHOL", "HDL", "LDLCALC", "TRIG", "CHOLHDL", "LDLDIRECT" in the last 72 hours. Thyroid Function Tests: No results for input(s): "TSH", "T4TOTAL", "T3FREE", "THYROIDAB" in the last 72 hours.  Invalid input(s): "FREET3" Anemia Panel: No results for  input(s): "VITAMINB12", "FOLATE", "FERRITIN", "TIBC", "IRON", "RETICCTPCT" in the last 72 hours.   Radiology: ECHO TEE  Result Date: 03/25/2023    TRANSESOPHOGEAL ECHO REPORT   Patient Name:   Julia Simmons Date of Exam: 03/25/2023 Medical Rec #:  [id number]       Height:       65.0 in Accession #:    [id number]      Weight:       323.4 lb Date of Birth:  1961-05-26        BSA:          2.426 m Patient Age:    62 years        BP:           129/75 mmHg Patient Gender: F               HR:           64 bpm. Exam Location:  [facility] Procedure: Transesophageal Echo, Cardiac Doppler and Color Doppler Indications:     SBE (subacute bacterial endocarditis) I33.0  History:         Patient has prior history of Echocardiogram examinations, most                  recent 03/20/2023. Risk Factors:Hypertension and Diabetes.  Sonographer:     Cristela Blue Referring Phys:  [id number] DWAYNE D CALLWOOD Diagnosing Phys: Windell Norfolk PROCEDURE: The transesophogeal probe was passed without difficulty through the esophogus of the patient. Local oropharyngeal anesthetic was provided with Cetacaine. Sedation performed by different physician. Image quality was excellent. The patient developed no complications during the procedure. Sedation provided by anesthesiologist.  IMPRESSIONS  1. Left ventricular ejection fraction, by estimation, is 25 to 30%. The left ventricle has severely decreased function.  2. Right ventricular systolic function is normal. The right ventricular size is normal.  3. No left atrial/left atrial appendage thrombus was detected.  4. The mitral valve is normal in structure. Mild mitral valve regurgitation.  5. No valvular vegetation. The aortic valve is tricuspid. There is mild calcification of the aortic valve. There is mild thickening of the  [facility] CARDIOLOGY CONSULT NOTE       Patient ID: Julia Simmons MRN: [id number] DOB/AGE: Nov 03, 1960 62 y.o.  Admit date: 03/20/2023 Referring Physician Dr. Mikey College Primary Physician Dr. Maurine Minister Primary Cardiologist Dr. Darrold Junker Reason for Consultation NSTEMI  HPI: Julia Simmons is a 62 y.o. female  with a past medical history of coronary artery disease s/p DES to LAD 11/2021, ischemic cardiomyopathy, chronic HFrEF (EF 35-40% 11/2021), R MCA CVA 11/2021, hypertension, hyperlipidemia, type 2 diabetes, OSA on CPAP, morbid obesity who presented to the ED on 03/20/2023 for malaise, dysuria. Cardiology was consulted for further evaluation.   Interval history: -Patient feeling well overall this AM.  -Denies any SOB, chest pain. Placed back on supplemental O2.  -Plan for LHC today.    Review of systems complete and found to be negative unless listed above    Past Medical History:  Diagnosis Date   Charcot's joint of foot    Coronary artery disease    Diabetes mellitus with neuropathy (HCC)    Diabetic retinopathy (HCC)    Hypertension    Morbid obesity with BMI of 50.0-59.9, adult (HCC)    Sleep apnea    Stroke [facility])     Past Surgical History:  Procedure Laterality Date   CATARACT EXTRACTION W/PHACO Right 07/18/2022   Procedure: CATARACT EXTRACTION PHACO AND INTRAOCULAR LENS PLACEMENT (IOC) RIGHT DIABETIC HEALON 5 VISION BLUE 33.79 2:31.3;  Surgeon: Lockie Mola, MD;  Location: [facility];  Service: Ophthalmology;  Laterality: Right;   CESAREAN SECTION      Medications Prior to Admission  Medication Sig Dispense Refill Last Dose   albuterol (VENTOLIN HFA) 108 (90 Base) MCG/ACT inhaler Inhale into the lungs.   03/20/2023   aspirin EC 81 MG tablet Take 81 mg by mouth daily.   03/19/2023   atorvastatin (LIPITOR) 40 MG tablet Take by mouth.   03/19/2023   clopidogrel (PLAVIX) 75 MG tablet Take 75 mg by mouth daily.   03/19/2023   furosemide (LASIX)  40 MG tablet Take 1.5 tablets (60 mg total) by mouth daily. (Patient taking differently: Take 40 mg by mouth 2 (two) times daily.) 30 tablet 1 03/19/2023   guaiFENesin-dextromethorphan (ROBITUSSIN DM) 100-10 MG/5ML syrup Take 10 mLs by mouth every 6 (six) hours as needed for cough. 118 mL 1 prn at unk   Insulin Aspart FlexPen (NOVOLOG) 100 UNIT/ML Inject 64 Units into the skin in the morning and at bedtime. Before lunch and dinner   03/19/2023   insulin glargine (LANTUS) 100 UNIT/ML injection Inject 100 Units into the skin at bedtime.   03/19/2023   metFORMIN (GLUCOPHAGE) 500 MG tablet Take 1,000 mg by mouth daily with breakfast.   03/19/2023   metFORMIN (GLUCOPHAGE) 500 MG tablet Take 1,000 mg by mouth daily with supper.   03/19/2023   metoprolol succinate (TOPROL-XL) 50 MG 24 hr tablet Take 1 tablet by mouth daily.   03/19/2023   oxybutynin (DITROPAN-XL) 10 MG 24 hr tablet Take 1 tablet by mouth daily.   03/19/2023   sacubitril-valsartan (ENTRESTO) 24-26 MG Take 1 tablet by mouth every 12 (twelve) hours.   03/19/2023   spironolactone (ALDACTONE) 25 MG tablet Take by mouth.   03/19/2023   BD INSULIN SYRINGE U/F 31G X 5/16" 1 ML MISC USE AS DIRECTED ONCE DAILY WITH LEVEMIR (VIAL). (Patient not taking: Reported on 07/09/2022)      Continuous Blood Gluc Sensor (FREESTYLE LIBRE SENSOR SYSTEM) MISC Use 1 kit every 14 (fourteen) days for  and tone for age. Extremities: Warm and well perfused. No clubbing, cyanosis.  Trace edema.  Neuro: Alert and oriented X 3. Psych: Answers questions appropriately.   Labs: Basic Metabolic Panel: Recent Labs    03/25/23 0915 03/26/23 0409  NA 139 139  K 4.1 3.7  CL 102 103  CO2 25 26  GLUCOSE 296* 237*  BUN 33* 35*  CREATININE 1.34* 1.27*  CALCIUM 9.0 8.8*   Liver Function Tests: No results for input(s): "AST", "ALT", "ALKPHOS", "BILITOT", "PROT", "ALBUMIN" in the last 72 hours.  No results for input(s): "LIPASE", "AMYLASE" in the last 72 hours. CBC: Recent Labs    03/24/23 0401 03/26/23 0409  WBC 6.3 7.0  HGB 10.4* 11.6*  HCT 31.5* 35.3*  MCV 93.2 92.7  PLT 270 343   Cardiac Enzymes: No results for input(s): "CKTOTAL", "CKMB", "CKMBINDEX", "TROPONINIHS" in the last 72 hours.  BNP: No results for input(s): "BNP" in the last 72 hours.  D-Dimer: No results for input(s): "DDIMER" in the last 72 hours. Hemoglobin A1C: No results for input(s): "HGBA1C" in the last 72 hours.  Fasting Lipid Panel: No results for input(s): "CHOL", "HDL", "LDLCALC", "TRIG", "CHOLHDL", "LDLDIRECT" in the last 72 hours. Thyroid Function Tests: No results for input(s): "TSH", "T4TOTAL", "T3FREE", "THYROIDAB" in the last 72 hours.  Invalid input(s): "FREET3" Anemia Panel: No results for  input(s): "VITAMINB12", "FOLATE", "FERRITIN", "TIBC", "IRON", "RETICCTPCT" in the last 72 hours.   Radiology: ECHO TEE  Result Date: 03/25/2023    TRANSESOPHOGEAL ECHO REPORT   Patient Name:   Julia Simmons Date of Exam: 03/25/2023 Medical Rec #:  [id number]       Height:       65.0 in Accession #:    [id number]      Weight:       323.4 lb Date of Birth:  1961-05-26        BSA:          2.426 m Patient Age:    62 years        BP:           129/75 mmHg Patient Gender: F               HR:           64 bpm. Exam Location:  [facility] Procedure: Transesophageal Echo, Cardiac Doppler and Color Doppler Indications:     SBE (subacute bacterial endocarditis) I33.0  History:         Patient has prior history of Echocardiogram examinations, most                  recent 03/20/2023. Risk Factors:Hypertension and Diabetes.  Sonographer:     Cristela Blue Referring Phys:  [id number] DWAYNE D CALLWOOD Diagnosing Phys: Windell Norfolk PROCEDURE: The transesophogeal probe was passed without difficulty through the esophogus of the patient. Local oropharyngeal anesthetic was provided with Cetacaine. Sedation performed by different physician. Image quality was excellent. The patient developed no complications during the procedure. Sedation provided by anesthesiologist.  IMPRESSIONS  1. Left ventricular ejection fraction, by estimation, is 25 to 30%. The left ventricle has severely decreased function.  2. Right ventricular systolic function is normal. The right ventricular size is normal.  3. No left atrial/left atrial appendage thrombus was detected.  4. The mitral valve is normal in structure. Mild mitral valve regurgitation.  5. No valvular vegetation. The aortic valve is tricuspid. There is mild calcification of the aortic valve. There is mild thickening of the  and tone for age. Extremities: Warm and well perfused. No clubbing, cyanosis.  Trace edema.  Neuro: Alert and oriented X 3. Psych: Answers questions appropriately.   Labs: Basic Metabolic Panel: Recent Labs    03/25/23 0915 03/26/23 0409  NA 139 139  K 4.1 3.7  CL 102 103  CO2 25 26  GLUCOSE 296* 237*  BUN 33* 35*  CREATININE 1.34* 1.27*  CALCIUM 9.0 8.8*   Liver Function Tests: No results for input(s): "AST", "ALT", "ALKPHOS", "BILITOT", "PROT", "ALBUMIN" in the last 72 hours.  No results for input(s): "LIPASE", "AMYLASE" in the last 72 hours. CBC: Recent Labs    03/24/23 0401 03/26/23 0409  WBC 6.3 7.0  HGB 10.4* 11.6*  HCT 31.5* 35.3*  MCV 93.2 92.7  PLT 270 343   Cardiac Enzymes: No results for input(s): "CKTOTAL", "CKMB", "CKMBINDEX", "TROPONINIHS" in the last 72 hours.  BNP: No results for input(s): "BNP" in the last 72 hours.  D-Dimer: No results for input(s): "DDIMER" in the last 72 hours. Hemoglobin A1C: No results for input(s): "HGBA1C" in the last 72 hours.  Fasting Lipid Panel: No results for input(s): "CHOL", "HDL", "LDLCALC", "TRIG", "CHOLHDL", "LDLDIRECT" in the last 72 hours. Thyroid Function Tests: No results for input(s): "TSH", "T4TOTAL", "T3FREE", "THYROIDAB" in the last 72 hours.  Invalid input(s): "FREET3" Anemia Panel: No results for  input(s): "VITAMINB12", "FOLATE", "FERRITIN", "TIBC", "IRON", "RETICCTPCT" in the last 72 hours.   Radiology: ECHO TEE  Result Date: 03/25/2023    TRANSESOPHOGEAL ECHO REPORT   Patient Name:   Julia Simmons Date of Exam: 03/25/2023 Medical Rec #:  [id number]       Height:       65.0 in Accession #:    [id number]      Weight:       323.4 lb Date of Birth:  1961-05-26        BSA:          2.426 m Patient Age:    62 years        BP:           129/75 mmHg Patient Gender: F               HR:           64 bpm. Exam Location:  [facility] Procedure: Transesophageal Echo, Cardiac Doppler and Color Doppler Indications:     SBE (subacute bacterial endocarditis) I33.0  History:         Patient has prior history of Echocardiogram examinations, most                  recent 03/20/2023. Risk Factors:Hypertension and Diabetes.  Sonographer:     Cristela Blue Referring Phys:  [id number] DWAYNE D CALLWOOD Diagnosing Phys: Windell Norfolk PROCEDURE: The transesophogeal probe was passed without difficulty through the esophogus of the patient. Local oropharyngeal anesthetic was provided with Cetacaine. Sedation performed by different physician. Image quality was excellent. The patient developed no complications during the procedure. Sedation provided by anesthesiologist.  IMPRESSIONS  1. Left ventricular ejection fraction, by estimation, is 25 to 30%. The left ventricle has severely decreased function.  2. Right ventricular systolic function is normal. The right ventricular size is normal.  3. No left atrial/left atrial appendage thrombus was detected.  4. The mitral valve is normal in structure. Mild mitral valve regurgitation.  5. No valvular vegetation. The aortic valve is tricuspid. There is mild calcification of the aortic valve. There is mild thickening of the  aortic valve. Aortic valve regurgitation is not visualized. FINDINGS  Left Ventricle: Left ventricular ejection fraction, by estimation, is 25 to 30%. The left ventricle has severely  decreased function. The left ventricular internal cavity size was normal in size. Right Ventricle: The right ventricular size is normal. No increase in right ventricular wall thickness. Right ventricular systolic function is normal. Left Atrium: Left atrial size was normal in size. No left atrial/left atrial appendage thrombus was detected. Right Atrium: Right atrial size was normal in size. Pericardium: There is no evidence of pericardial effusion. Mitral Valve: The mitral valve is normal in structure. Mild mitral valve regurgitation. Tricuspid Valve: The tricuspid valve is normal in structure. Tricuspid valve regurgitation is trivial. Aortic Valve: No valvular vegetation. The aortic valve is tricuspid. There is mild calcification of the aortic valve. There is mild thickening of the aortic valve. Aortic valve regurgitation is not visualized. Pulmonic Valve: The pulmonic valve was normal in structure. Pulmonic valve regurgitation is trivial. Aorta: The aortic root is normal in size and structure. IAS/Shunts: The interatrial septum was not well visualized. Windell Norfolk Electronically signed by Windell Norfolk Signature Date/Time: 03/25/2023/3:52:05 PM    Final    DG Chest 2 View  Result Date: 03/22/2023 CLINICAL DATA:  Pneumonia EXAM: CHEST - 2 VIEW COMPARISON:  03/21/2023 FINDINGS: Cardiomegaly. Mild, diffuse interstitial opacity similar to prior examination. No focal airspace opacity. The visualized skeletal structures are unremarkable. IMPRESSION: Cardiomegaly with mild, diffuse interstitial opacity similar to prior examination, consistent with edema or atypical/viral infection. No focal airspace opacity. Electronically Signed   By: Jearld Lesch M.D.   On: 03/22/2023 12:47   DG Chest 1 View  Result Date: 03/21/2023 CLINICAL DATA:  62 year old female with history of congestive heart failure. EXAM: CHEST  1 VIEW COMPARISON:  Chest x-ray 03/20/2023. FINDINGS: Vague opacity projecting over the left  hemithorax. Right lung appears clear. No right pleural effusion. No pneumothorax. No evidence of pulmonary edema. Heart size is normal. The patient is rotated to the left on today's exam, resulting in distortion of the mediastinal contours and reduced diagnostic sensitivity and specificity for mediastinal pathology. IMPRESSION: 1. Extensive airspace consolidation in the left lung compatible with pneumonia better demonstrated on prior chest CT. Overall, aeration has worsened in the left lung compared to the recent chest x-ray. Electronically Signed   By: Trudie Reed M.D.   On: 03/21/2023 07:22   ECHOCARDIOGRAM COMPLETE  Result Date: 03/20/2023    ECHOCARDIOGRAM REPORT   Patient Name:   Julia Simmons Date of Exam: 03/20/2023 Medical Rec #:  [id number]       Height:       65.0 in Accession #:    [id number]      Weight:       318.0 lb Date of Birth:  Mar 24, 1961        BSA:          2.409 m Patient Age:    62 years        BP:           103/78 mmHg Patient Gender: F               HR:           96 bpm. Exam Location:  [facility] Procedure: 2D Echo, Color Doppler, Cardiac Doppler and Intracardiac            Opacification Agent Indications:     NSTEMI I21.4  History:  aortic valve. Aortic valve regurgitation is not visualized. FINDINGS  Left Ventricle: Left ventricular ejection fraction, by estimation, is 25 to 30%. The left ventricle has severely  decreased function. The left ventricular internal cavity size was normal in size. Right Ventricle: The right ventricular size is normal. No increase in right ventricular wall thickness. Right ventricular systolic function is normal. Left Atrium: Left atrial size was normal in size. No left atrial/left atrial appendage thrombus was detected. Right Atrium: Right atrial size was normal in size. Pericardium: There is no evidence of pericardial effusion. Mitral Valve: The mitral valve is normal in structure. Mild mitral valve regurgitation. Tricuspid Valve: The tricuspid valve is normal in structure. Tricuspid valve regurgitation is trivial. Aortic Valve: No valvular vegetation. The aortic valve is tricuspid. There is mild calcification of the aortic valve. There is mild thickening of the aortic valve. Aortic valve regurgitation is not visualized. Pulmonic Valve: The pulmonic valve was normal in structure. Pulmonic valve regurgitation is trivial. Aorta: The aortic root is normal in size and structure. IAS/Shunts: The interatrial septum was not well visualized. Windell Norfolk Electronically signed by Windell Norfolk Signature Date/Time: 03/25/2023/3:52:05 PM    Final    DG Chest 2 View  Result Date: 03/22/2023 CLINICAL DATA:  Pneumonia EXAM: CHEST - 2 VIEW COMPARISON:  03/21/2023 FINDINGS: Cardiomegaly. Mild, diffuse interstitial opacity similar to prior examination. No focal airspace opacity. The visualized skeletal structures are unremarkable. IMPRESSION: Cardiomegaly with mild, diffuse interstitial opacity similar to prior examination, consistent with edema or atypical/viral infection. No focal airspace opacity. Electronically Signed   By: Jearld Lesch M.D.   On: 03/22/2023 12:47   DG Chest 1 View  Result Date: 03/21/2023 CLINICAL DATA:  62 year old female with history of congestive heart failure. EXAM: CHEST  1 VIEW COMPARISON:  Chest x-ray 03/20/2023. FINDINGS: Vague opacity projecting over the left  hemithorax. Right lung appears clear. No right pleural effusion. No pneumothorax. No evidence of pulmonary edema. Heart size is normal. The patient is rotated to the left on today's exam, resulting in distortion of the mediastinal contours and reduced diagnostic sensitivity and specificity for mediastinal pathology. IMPRESSION: 1. Extensive airspace consolidation in the left lung compatible with pneumonia better demonstrated on prior chest CT. Overall, aeration has worsened in the left lung compared to the recent chest x-ray. Electronically Signed   By: Trudie Reed M.D.   On: 03/21/2023 07:22   ECHOCARDIOGRAM COMPLETE  Result Date: 03/20/2023    ECHOCARDIOGRAM REPORT   Patient Name:   Julia Simmons Date of Exam: 03/20/2023 Medical Rec #:  [id number]       Height:       65.0 in Accession #:    [id number]      Weight:       318.0 lb Date of Birth:  Mar 24, 1961        BSA:          2.409 m Patient Age:    62 years        BP:           103/78 mmHg Patient Gender: F               HR:           96 bpm. Exam Location:  [facility] Procedure: 2D Echo, Color Doppler, Cardiac Doppler and Intracardiac            Opacification Agent Indications:     NSTEMI I21.4  History:  and tone for age. Extremities: Warm and well perfused. No clubbing, cyanosis.  Trace edema.  Neuro: Alert and oriented X 3. Psych: Answers questions appropriately.   Labs: Basic Metabolic Panel: Recent Labs    03/25/23 0915 03/26/23 0409  NA 139 139  K 4.1 3.7  CL 102 103  CO2 25 26  GLUCOSE 296* 237*  BUN 33* 35*  CREATININE 1.34* 1.27*  CALCIUM 9.0 8.8*   Liver Function Tests: No results for input(s): "AST", "ALT", "ALKPHOS", "BILITOT", "PROT", "ALBUMIN" in the last 72 hours.  No results for input(s): "LIPASE", "AMYLASE" in the last 72 hours. CBC: Recent Labs    03/24/23 0401 03/26/23 0409  WBC 6.3 7.0  HGB 10.4* 11.6*  HCT 31.5* 35.3*  MCV 93.2 92.7  PLT 270 343   Cardiac Enzymes: No results for input(s): "CKTOTAL", "CKMB", "CKMBINDEX", "TROPONINIHS" in the last 72 hours.  BNP: No results for input(s): "BNP" in the last 72 hours.  D-Dimer: No results for input(s): "DDIMER" in the last 72 hours. Hemoglobin A1C: No results for input(s): "HGBA1C" in the last 72 hours.  Fasting Lipid Panel: No results for input(s): "CHOL", "HDL", "LDLCALC", "TRIG", "CHOLHDL", "LDLDIRECT" in the last 72 hours. Thyroid Function Tests: No results for input(s): "TSH", "T4TOTAL", "T3FREE", "THYROIDAB" in the last 72 hours.  Invalid input(s): "FREET3" Anemia Panel: No results for  input(s): "VITAMINB12", "FOLATE", "FERRITIN", "TIBC", "IRON", "RETICCTPCT" in the last 72 hours.   Radiology: ECHO TEE  Result Date: 03/25/2023    TRANSESOPHOGEAL ECHO REPORT   Patient Name:   Julia Simmons Date of Exam: 03/25/2023 Medical Rec #:  [id number]       Height:       65.0 in Accession #:    [id number]      Weight:       323.4 lb Date of Birth:  1961-05-26        BSA:          2.426 m Patient Age:    62 years        BP:           129/75 mmHg Patient Gender: F               HR:           64 bpm. Exam Location:  [facility] Procedure: Transesophageal Echo, Cardiac Doppler and Color Doppler Indications:     SBE (subacute bacterial endocarditis) I33.0  History:         Patient has prior history of Echocardiogram examinations, most                  recent 03/20/2023. Risk Factors:Hypertension and Diabetes.  Sonographer:     Cristela Blue Referring Phys:  [id number] DWAYNE D CALLWOOD Diagnosing Phys: Windell Norfolk PROCEDURE: The transesophogeal probe was passed without difficulty through the esophogus of the patient. Local oropharyngeal anesthetic was provided with Cetacaine. Sedation performed by different physician. Image quality was excellent. The patient developed no complications during the procedure. Sedation provided by anesthesiologist.  IMPRESSIONS  1. Left ventricular ejection fraction, by estimation, is 25 to 30%. The left ventricle has severely decreased function.  2. Right ventricular systolic function is normal. The right ventricular size is normal.  3. No left atrial/left atrial appendage thrombus was detected.  4. The mitral valve is normal in structure. Mild mitral valve regurgitation.  5. No valvular vegetation. The aortic valve is tricuspid. There is mild calcification of the aortic valve. There is mild thickening of the  [facility] CARDIOLOGY CONSULT NOTE       Patient ID: Julia Simmons MRN: [id number] DOB/AGE: Nov 03, 1960 62 y.o.  Admit date: 03/20/2023 Referring Physician Dr. Mikey College Primary Physician Dr. Maurine Minister Primary Cardiologist Dr. Darrold Junker Reason for Consultation NSTEMI  HPI: Julia Simmons is a 62 y.o. female  with a past medical history of coronary artery disease s/p DES to LAD 11/2021, ischemic cardiomyopathy, chronic HFrEF (EF 35-40% 11/2021), R MCA CVA 11/2021, hypertension, hyperlipidemia, type 2 diabetes, OSA on CPAP, morbid obesity who presented to the ED on 03/20/2023 for malaise, dysuria. Cardiology was consulted for further evaluation.   Interval history: -Patient feeling well overall this AM.  -Denies any SOB, chest pain. Placed back on supplemental O2.  -Plan for LHC today.    Review of systems complete and found to be negative unless listed above    Past Medical History:  Diagnosis Date   Charcot's joint of foot    Coronary artery disease    Diabetes mellitus with neuropathy (HCC)    Diabetic retinopathy (HCC)    Hypertension    Morbid obesity with BMI of 50.0-59.9, adult (HCC)    Sleep apnea    Stroke [facility])     Past Surgical History:  Procedure Laterality Date   CATARACT EXTRACTION W/PHACO Right 07/18/2022   Procedure: CATARACT EXTRACTION PHACO AND INTRAOCULAR LENS PLACEMENT (IOC) RIGHT DIABETIC HEALON 5 VISION BLUE 33.79 2:31.3;  Surgeon: Lockie Mola, MD;  Location: [facility];  Service: Ophthalmology;  Laterality: Right;   CESAREAN SECTION      Medications Prior to Admission  Medication Sig Dispense Refill Last Dose   albuterol (VENTOLIN HFA) 108 (90 Base) MCG/ACT inhaler Inhale into the lungs.   03/20/2023   aspirin EC 81 MG tablet Take 81 mg by mouth daily.   03/19/2023   atorvastatin (LIPITOR) 40 MG tablet Take by mouth.   03/19/2023   clopidogrel (PLAVIX) 75 MG tablet Take 75 mg by mouth daily.   03/19/2023   furosemide (LASIX)  40 MG tablet Take 1.5 tablets (60 mg total) by mouth daily. (Patient taking differently: Take 40 mg by mouth 2 (two) times daily.) 30 tablet 1 03/19/2023   guaiFENesin-dextromethorphan (ROBITUSSIN DM) 100-10 MG/5ML syrup Take 10 mLs by mouth every 6 (six) hours as needed for cough. 118 mL 1 prn at unk   Insulin Aspart FlexPen (NOVOLOG) 100 UNIT/ML Inject 64 Units into the skin in the morning and at bedtime. Before lunch and dinner   03/19/2023   insulin glargine (LANTUS) 100 UNIT/ML injection Inject 100 Units into the skin at bedtime.   03/19/2023   metFORMIN (GLUCOPHAGE) 500 MG tablet Take 1,000 mg by mouth daily with breakfast.   03/19/2023   metFORMIN (GLUCOPHAGE) 500 MG tablet Take 1,000 mg by mouth daily with supper.   03/19/2023   metoprolol succinate (TOPROL-XL) 50 MG 24 hr tablet Take 1 tablet by mouth daily.   03/19/2023   oxybutynin (DITROPAN-XL) 10 MG 24 hr tablet Take 1 tablet by mouth daily.   03/19/2023   sacubitril-valsartan (ENTRESTO) 24-26 MG Take 1 tablet by mouth every 12 (twelve) hours.   03/19/2023   spironolactone (ALDACTONE) 25 MG tablet Take by mouth.   03/19/2023   BD INSULIN SYRINGE U/F 31G X 5/16" 1 ML MISC USE AS DIRECTED ONCE DAILY WITH LEVEMIR (VIAL). (Patient not taking: Reported on 07/09/2022)      Continuous Blood Gluc Sensor (FREESTYLE LIBRE SENSOR SYSTEM) MISC Use 1 kit every 14 (fourteen) days for  [facility] CARDIOLOGY CONSULT NOTE       Patient ID: Julia Simmons MRN: [id number] DOB/AGE: Nov 03, 1960 62 y.o.  Admit date: 03/20/2023 Referring Physician Dr. Mikey College Primary Physician Dr. Maurine Minister Primary Cardiologist Dr. Darrold Junker Reason for Consultation NSTEMI  HPI: Julia Simmons is a 62 y.o. female  with a past medical history of coronary artery disease s/p DES to LAD 11/2021, ischemic cardiomyopathy, chronic HFrEF (EF 35-40% 11/2021), R MCA CVA 11/2021, hypertension, hyperlipidemia, type 2 diabetes, OSA on CPAP, morbid obesity who presented to the ED on 03/20/2023 for malaise, dysuria. Cardiology was consulted for further evaluation.   Interval history: -Patient feeling well overall this AM.  -Denies any SOB, chest pain. Placed back on supplemental O2.  -Plan for LHC today.    Review of systems complete and found to be negative unless listed above    Past Medical History:  Diagnosis Date   Charcot's joint of foot    Coronary artery disease    Diabetes mellitus with neuropathy (HCC)    Diabetic retinopathy (HCC)    Hypertension    Morbid obesity with BMI of 50.0-59.9, adult (HCC)    Sleep apnea    Stroke [facility])     Past Surgical History:  Procedure Laterality Date   CATARACT EXTRACTION W/PHACO Right 07/18/2022   Procedure: CATARACT EXTRACTION PHACO AND INTRAOCULAR LENS PLACEMENT (IOC) RIGHT DIABETIC HEALON 5 VISION BLUE 33.79 2:31.3;  Surgeon: Lockie Mola, MD;  Location: [facility];  Service: Ophthalmology;  Laterality: Right;   CESAREAN SECTION      Medications Prior to Admission  Medication Sig Dispense Refill Last Dose   albuterol (VENTOLIN HFA) 108 (90 Base) MCG/ACT inhaler Inhale into the lungs.   03/20/2023   aspirin EC 81 MG tablet Take 81 mg by mouth daily.   03/19/2023   atorvastatin (LIPITOR) 40 MG tablet Take by mouth.   03/19/2023   clopidogrel (PLAVIX) 75 MG tablet Take 75 mg by mouth daily.   03/19/2023   furosemide (LASIX)  40 MG tablet Take 1.5 tablets (60 mg total) by mouth daily. (Patient taking differently: Take 40 mg by mouth 2 (two) times daily.) 30 tablet 1 03/19/2023   guaiFENesin-dextromethorphan (ROBITUSSIN DM) 100-10 MG/5ML syrup Take 10 mLs by mouth every 6 (six) hours as needed for cough. 118 mL 1 prn at unk   Insulin Aspart FlexPen (NOVOLOG) 100 UNIT/ML Inject 64 Units into the skin in the morning and at bedtime. Before lunch and dinner   03/19/2023   insulin glargine (LANTUS) 100 UNIT/ML injection Inject 100 Units into the skin at bedtime.   03/19/2023   metFORMIN (GLUCOPHAGE) 500 MG tablet Take 1,000 mg by mouth daily with breakfast.   03/19/2023   metFORMIN (GLUCOPHAGE) 500 MG tablet Take 1,000 mg by mouth daily with supper.   03/19/2023   metoprolol succinate (TOPROL-XL) 50 MG 24 hr tablet Take 1 tablet by mouth daily.   03/19/2023   oxybutynin (DITROPAN-XL) 10 MG 24 hr tablet Take 1 tablet by mouth daily.   03/19/2023   sacubitril-valsartan (ENTRESTO) 24-26 MG Take 1 tablet by mouth every 12 (twelve) hours.   03/19/2023   spironolactone (ALDACTONE) 25 MG tablet Take by mouth.   03/19/2023   BD INSULIN SYRINGE U/F 31G X 5/16" 1 ML MISC USE AS DIRECTED ONCE DAILY WITH LEVEMIR (VIAL). (Patient not taking: Reported on 07/09/2022)      Continuous Blood Gluc Sensor (FREESTYLE LIBRE SENSOR SYSTEM) MISC Use 1 kit every 14 (fourteen) days for

## 2023-03-26 NOTE — Anesthesia Postprocedure Evaluation (Signed)
Anesthesia Post Note  Patient: Julia Simmons  Procedure(s) Performed: TRANSESOPHAGEAL ECHOCARDIOGRAM  Patient location during evaluation: Specials Recovery Anesthesia Type: General Level of consciousness: awake and alert Pain management: pain level controlled Vital Signs Assessment: post-procedure vital signs reviewed and stable Respiratory status: spontaneous breathing, nonlabored ventilation, respiratory function stable and patient connected to nasal cannula oxygen Cardiovascular status: blood pressure returned to baseline and stable Postop Assessment: no apparent nausea or vomiting Anesthetic complications: no   No notable events documented.   Last Vitals:  Vitals:   03/26/23 1234 03/26/23 1346  BP: (!) 156/83   Pulse: 65   Resp: 17   Temp: 36.8 C   SpO2: 92% 95%    Last Pain:  Vitals:   03/26/23 1459  TempSrc:   PainSc: 0-No pain                 Corinda Gubler

## 2023-03-26 NOTE — Progress Notes (Addendum)
Progress Note   Patient: Julia Simmons:[id number] DOB: 22-Jun-1960 DOA: 03/20/2023     6 DOS: the patient was seen and examined on 03/26/2023   Brief hospital course: Julia Simmons is a 62 y.o. female with medical history significant of CAD with LAD stenting x 2 in 2023, chronic HFrEF and HFpEF with LVEF 35-40% in 2023, HTN, IDDM with insulin resistance, HLD, morbid obesity, OSA on CPAP at bedtime, CKD stage II, frequent UTIs, presented with malaise, dysuria, chills.  Patient also has significant short of breath, hypoxemia of 85%, she was hypotensive at time of admission, blood pressure 76/37, oxygen saturation 85% on room air.  Patient received IV fluid being bolus in the emergency room.   Chest CT scan showed a large pneumonia involving the entire left lung. Blood culture also positive for E. coli. Patient is treated with Rocephin and Zithromax. Patient so far has completed Zithromax, left lung pneumonia essentially resolved, consistent with aspiration. TEE was performed on 10/7, did not see any vegetation.  Decision is made patient will be treated with antibiotics for 10 to 14 days. Patient also had a significant elevation of troponin more than 10,000, coronary angiogram performed on 10/8.    Principal Problem:   Severe sepsis (HCC) Active Problems:   PNA (pneumonia)   Diabetes mellitus with neuropathy (HCC)   Hypertension   Morbid obesity with BMI of 50.0-59.9, adult (HCC)   Sepsis secondary to UTI [facility])   CAD S/P percutaneous coronary angioplasty   NSTEMI (non-ST elevated myocardial infarction) (HCC)   E. coli septicemia (HCC)   Chronic systolic CHF (congestive heart failure) (HCC)   Acute hypoxemic respiratory failure (HCC)   E coli bacteremia   Acute congestive heart failure (HCC)   Assessment and Plan:  Acute respiratory failure with hypoxemia. Left lung pneumonia. Personally reviewed patient chest CT head scan images, patient has extensive pneumonia involving the  entire left lung.  This appears to be bacterial, procalcitonin level significantly increased.  Patient developed significant respiratory distress and hypoxemia. Patient does not have significant dysphagia, she vomited the day prior. Repeated chest x-ray 10/4 showed significant improvement in the left lung pneumonia.  Continue Rocephin, completed Zithromax. Patient had improved, off oxygen.  On Rocephin for septicemia.   Severe sepsis secondary to pneumonia. E. coli septicemia. Possible vegetation in aortic valve ruled out E. coli UTI and pyelonephritis. Patient met sepsis criteria with significant tachycardia and tachypnea, additionally, patient has elevated lactic acid, elevated troponin, significant hypotension. Patient blood culture was positive for E. coli, echocardiogram showed possible small vegetation in the aortic valve.  Cardiology is seeing the patient. Source of bacteremia could be from urinary, CT abdomen/pelvis showed evidence of pyelonephritis.  Urine culture also showed E. coli. Continue high-dose Rocephin. CT scan also suspect ureter abnormality with possibility of malignancy.  Outpatient follow-up with urology. TEE performed on 10/7 did not show any vegetation.  Patient will need antibiotics for 10 to 14 days.  Discussed with ID, Dr. Joylene Draft recommended 10 days of Rocephin (additional 3 days after tomorrow), will order a midline.    Non-STEMI. Acute on chronic systolic congestive heart failure. Patient troponin is more than 10,000.  Completed 3 days IV heparin. Discussed with cardiology, patient will be treated with aspirin and Plavix.    Patient was also placed on IV Lasix at time of admission, echocardiogram showed worsening ejection fraction.  Volume status much better after treatment. Repeat blood cultures so far negative. Coronary angiogram was performed 10/8, showed 70  to 75% stenosis in proximal and distal RCA.  70% stenosis in first diagonal lesion.  95% in  proximal to mid LAD.  Attempt to place a stent into LAD was unsuccessful.  Decided to treat medically.  Ejection fraction 25 to 30%, refer patient for ICD. Patient will be monitored overnight post cath, most likely discharge home tomorrow.     Uncontrolled type 2 diabetes with hyperglycemia. Patient received 60 units of insulin glargine this morning, glucose still running high even with NPO. Medication adjusted to increase to home dose insulin glargine to 100 units, moderate sliding scale insulin and increase NovoLog.   Chronic kidney disease stage IIIb. Metabolic acidosis. Patient renal function appears to be stable at this time. Condition had improved.   Morbid obesity. Obstructive sleep apnea. CPAP while asleep.        Subjective:  Patient doing well today, she was placed on 2 L oxygen last night instead of CPAP.  She has no complaint today.  Physical Exam: Vitals:   03/26/23 0742 03/26/23 1202 03/26/23 1234 03/26/23 1346  BP: 137/78 (!) 152/60 (!) 156/83   Pulse: (!) 59 65 65   Resp:   17   Temp: 97.7 F (36.5 C) 98.1 F (36.7 C) 98.3 F (36.8 C)   TempSrc:   Oral   SpO2: 97% 95% 92% 95%  Weight:   (!) 141.8 kg   Height:   5\' 5"  (1.651 m)    General exam: Appears calm and comfortable, morbid obese. Respiratory system: Clear to auscultation. Respiratory effort normal. Cardiovascular system: S1 & S2 heard, RRR. No JVD, murmurs, rubs, gallops or clicks. No pedal edema. Gastrointestinal system: Abdomen is nondistended, soft and nontender. No organomegaly or masses felt. Normal bowel sounds heard. Central nervous system: Alert and oriented. No focal neurological deficits. Extremities: Symmetric 5 x 5 power. Skin: No rashes, lesions or ulcers Psychiatry: Judgement and insight appear normal. Mood & affect appropriate.    Data Reviewed:  Lab results reviewed.  Family Communication: None  Disposition: Status is: Inpatient Remains inpatient appropriate because:  Severity disease, IV antibiotics,     Time spent: 35 minutes  Author: Marrion Coy, MD 03/26/2023 3:23 PM  For on call review [email or web address].

## 2023-03-26 NOTE — Progress Notes (Signed)
Dr. Darrold Junker at bedside, speaking with pt. And her son re: cath results. Both verbalized understanding of conversation with MD.

## 2023-03-26 NOTE — Progress Notes (Signed)
PULMONOLOGY         Date: 03/26/2023,   MRN# [id number] Julia Simmons [birth year]     AdmissionWeight: (!) 144.2 kg                 CurrentWeight: (!) 141.8 kg  Referring provider: Dr Chipper Herb   CHIEF COMPLAINT:   Multifocal pneumonia with E.coli bacteremia   HISTORY OF PRESENT ILLNESS   This is 62 year old morbidly obese female with a BMI of over 50 who has diabetes with chronic neuropathy, retinopathy and Charcot's foot, severe coronary artery disease status post 2 stents and reports having previous CVA and congestive heart failure, also background history of sleep apnea who came in with shortness of breath cough and chills x 2 days.  She was found to have multifocal pneumonia worse on the left on CT chest imaging with PE protocol.  She did have microbiology including blood culture showing E. coli bacteremia.  Patient is on room air during my evaluation.  She does report acid reflux but denies having aspiration events and denies vomiting.  03/22/23- patient with no acute events overnight.  Net negative 1.5 L and with 2L/min [place]. BMP with improved renal function.   03/23/23- patient feeling better this am, husband at bedside.  She's on 1.5-2L/min in no distress.  She's ambulatory in room going to bathroom.  She is net 1.6L negative.  Has cardiac workup in progess.  Remains on zithromax rocephin.  03/25/23- patient had TEE today, she's a bit sedated still during my evaluation.  Repeating CXR today for interval changes. On rocephin for ecoli pneumonia.  LE edema is worse this am then 2 days ago.  03/26/23- patient stable from pulmonary perspective.  PCCM signing off and can follow up with patient on outpatient basis.  She's cleared for dc home from pulmonary standpoint  PAST MEDICAL HISTORY   Past Medical History:  Diagnosis Date   Charcot's joint of foot    Coronary artery disease    Diabetes mellitus with neuropathy (HCC)    Diabetic retinopathy (HCC)    Hypertension     Morbid obesity with BMI of 50.0-59.9, adult (HCC)    Sleep apnea    Stroke [facility])      SURGICAL HISTORY   Past Surgical History:  Procedure Laterality Date   CATARACT EXTRACTION W/PHACO Right 07/18/2022   Procedure: CATARACT EXTRACTION PHACO AND INTRAOCULAR LENS PLACEMENT (IOC) RIGHT DIABETIC HEALON 5 VISION BLUE 33.79 2:31.3;  Surgeon: Lockie Mola, MD;  Location: [facility];  Service: Ophthalmology;  Laterality: Right;   CESAREAN SECTION       FAMILY HISTORY   Family History  Problem Relation Age of Onset   Breast cancer Neg Hx      SOCIAL HISTORY   Social History   Tobacco Use   Smoking status: Never   Smokeless tobacco: Never  Vaping Use   Vaping status: Never Used  Substance Use Topics   Alcohol use: Not Currently   Drug use: Never     MEDICATIONS    Home Medication:    Current Medication:  Current Facility-Administered Medications:    0.9 %  sodium chloride infusion, 250 mL, Intravenous, PRN, Callwood, Dwayne D, MD   [EXPIRED] 0.9% sodium chloride infusion, 3 mL/kg/hr, Intravenous, Continuous, Last Rate: 440.1 mL/hr at 03/26/23 0610, 3 mL/kg/hr at 03/26/23 0610 **FOLLOWED BY** 0.9% sodium chloride infusion, 1 mL/kg/hr, Intravenous, Continuous, Callwood, Dwayne D, MD, Last Rate: 146.7 mL/hr at 03/26/23 0704, 1 mL/kg/hr at 03/26/23  1610   acetaminophen (TYLENOL) tablet 650 mg, 650 mg, Oral, Q6H PRN, Mikey College T, MD, 650 mg at 03/21/23 0039   albuterol (PROVENTIL) (2.5 MG/3ML) 0.083% nebulizer solution 3 mL, 3 mL, Nebulization, Q4H PRN, Emeline General, MD   [START ON 03/27/2023] aspirin EC tablet 81 mg, 81 mg, Oral, Daily, Lowella Bandy, RPH   atorvastatin (LIPITOR) tablet 40 mg, 40 mg, Oral, Daily, Mikey College T, MD, 40 mg at 03/25/23 0844   bacitracin ointment, , Topical, BID, Marrion Coy, MD, 1 Application at 03/25/23 1757   budesonide (PULMICORT) nebulizer solution 0.5 mg, 0.5 mg, Nebulization, Q12H, Marrion Coy, MD, 0.5 mg at 03/26/23  0733   cefTRIAXone (ROCEPHIN) 2 g in sodium chloride 0.9 % 100 mL IVPB, 2 g, Intravenous, Q24H, Marrion Coy, MD, Stopped at 03/25/23 1030   clopidogrel (PLAVIX) tablet 75 mg, 75 mg, Oral, Daily, Mikey College T, MD, 75 mg at 03/25/23 0843   furosemide (LASIX) tablet 40 mg, 40 mg, Oral, BID, Hudson, Caralyn, PA-C, 40 mg at 03/25/23 1757   guaiFENesin-dextromethorphan (ROBITUSSIN DM) 100-10 MG/5ML syrup 10 mL, 10 mL, Oral, Q6H PRN, Mikey College T, MD   insulin aspart (novoLOG) injection 0-15 Units, 0-15 Units, Subcutaneous, TID WC, Marrion Coy, MD, 8 Units at 03/25/23 1739   insulin aspart (novoLOG) injection 0-5 Units, 0-5 Units, Subcutaneous, QHS, Marrion Coy, MD, 3 Units at 03/25/23 2052   insulin aspart (novoLOG) injection 10 Units, 10 Units, Subcutaneous, TID WC, Marrion Coy, MD   insulin glargine-yfgn (SEMGLEE) injection 100 Units, 100 Units, Subcutaneous, Daily, Marrion Coy, MD   ipratropium-albuterol (DUONEB) 0.5-2.5 (3) MG/3ML nebulizer solution 3 mL, 3 mL, Nebulization, BID, Marrion Coy, MD, 3 mL at 03/26/23 0733   linagliptin (TRADJENTA) tablet 5 mg, 5 mg, Oral, Daily, Mikey College T, MD, 5 mg at 03/24/23 0851   metoprolol succinate (TOPROL-XL) 24 hr tablet 25 mg, 25 mg, Oral, BID, Hudson, Caralyn, PA-C, 25 mg at 03/25/23 2051   ondansetron (ZOFRAN) injection 4 mg, 4 mg, Intravenous, Q6H PRN, Mikey College T, MD, 4 mg at 03/21/23 1737   oxybutynin (DITROPAN-XL) 24 hr tablet 10 mg, 10 mg, Oral, Daily, Mikey College T, MD, 10 mg at 03/25/23 9604   sacubitril-valsartan (ENTRESTO) 24-26 mg per tablet, 1 tablet, Oral, BID, Alluri, Meryl Dare, MD, 1 tablet at 03/25/23 2050   sodium chloride flush (NS) 0.9 % injection 3 mL, 3 mL, Intravenous, Q12H, Callwood, Dwayne D, MD, 3 mL at 03/25/23 2052   sodium chloride flush (NS) 0.9 % injection 3 mL, 3 mL, Intravenous, PRN, Callwood, Dwayne D, MD   spironolactone (ALDACTONE) tablet 25 mg, 25 mg, Oral, Daily, Hudson, Caralyn, PA-C, 25 mg at 03/25/23  1356    ALLERGIES   Hydrochlorothiazide and Sulfa antibiotics     REVIEW OF SYSTEMS    Review of Systems:  Gen:  Denies  fever, sweats, chills weigh loss  HEENT: Denies blurred vision, double vision, ear pain, eye pain, hearing loss, nose bleeds, sore throat Cardiac:  No dizziness, chest pain or heaviness, chest tightness,edema Resp:   reports dyspnea chronically  Gi: Denies swallowing difficulty, stomach pain, nausea or vomiting, diarrhea, constipation, bowel incontinence Gu:  Denies bladder incontinence, burning urine Ext:   Denies Joint pain, stiffness or swelling Skin: Denies  skin rash, easy bruising or bleeding or hives Endoc:  Denies polyuria, polydipsia , polyphagia or weight change Psych:   Denies depression, insomnia or hallucinations   Other:  All other systems negative   VS: BP  137/78 (BP Location: Right Arm)   Pulse (!) 59   Temp 97.7 F (36.5 C)   Resp 18   Ht 5\' 5"  (1.651 m)   Wt (!) 141.8 kg   SpO2 97%   BMI 52.04 kg/m      PHYSICAL EXAM    GENERAL:NAD, no fevers, chills, no weakness no fatigue HEAD: Normocephalic, atraumatic.  EYES: Pupils equal, round, reactive to light. Extraocular muscles intact. No scleral icterus.  MOUTH: Moist mucosal membrane. Dentition intact. No abscess noted.  EAR, NOSE, THROAT: Clear without exudates. No external lesions.  NECK: Supple. No thyromegaly. No nodules. No JVD.  PULMONARY: decreased breath sounds with mild rhonchi worse at bases bilaterally.  CARDIOVASCULAR: S1 and S2. Regular rate and rhythm. No murmurs, rubs, or gallops. No edema. Pedal pulses 2+ bilaterally.  GASTROINTESTINAL: Soft, nontender, nondistended. No masses. Positive bowel sounds. No hepatosplenomegaly.  MUSCULOSKELETAL: No swelling, clubbing, or edema. Range of motion full in all extremities.  NEUROLOGIC: Cranial nerves II through XII are intact. No gross focal neurological deficits. Sensation intact. Reflexes intact.  SKIN: No ulceration,  lesions, rashes, or cyanosis. Skin warm and dry. Turgor intact.  PSYCHIATRIC: Mood, affect within normal limits. The patient is awake, alert and oriented x 3. Insight, judgment intact.       IMAGING   \  ASSESSMENT/PLAN   Multifocal pneumonia -suspecti due to e.coli - present on admission  - COVID19 negative  - supplemental O2 during my evaluation room air -reviewed pertinent imaging with patient today -PT/OT for d/c planning  -please encourage patient to use incentive spirometer few times each hour while hospitalized.       Ecoli bacteremia and pneumonia   - patient without shock physiology     - ID consultation - appreciate input    - continue antibiotics , currently rocephin     OSA    Continue home CPAP with settings as per RT   - orders placed    Thank you for allowing me to participate in the care of this patient.   Patient/Family are satisfied with care plan and all questions have been answered.    Provider disclosure: Patient with at least one acute or chronic illness or injury that poses a threat to life or bodily function and is being managed actively during this encounter.  All of the below services have been performed independently by signing provider:  review of prior documentation from internal and or external health records.  Review of previous and current lab results.  Interview and comprehensive assessment during patient visit today. Review of current and previous chest radiographs/CT scans. Discussion of management and test interpretation with health care team and patient/family.   This document was prepared using Dragon voice recognition software and may include unintentional dictation errors.     Vida Rigger, M.D.  Division of Pulmonary & Critical Care Medicine

## 2023-03-27 ENCOUNTER — Other Ambulatory Visit: Payer: Self-pay | Admitting: *Deleted

## 2023-03-27 ENCOUNTER — Encounter: Payer: Self-pay | Admitting: Cardiology

## 2023-03-27 DIAGNOSIS — A4151 Sepsis due to Escherichia coli [E. coli]: Secondary | ICD-10-CM | POA: Diagnosis not present

## 2023-03-27 DIAGNOSIS — N1832 Chronic kidney disease, stage 3b: Secondary | ICD-10-CM | POA: Diagnosis present

## 2023-03-27 DIAGNOSIS — R7881 Bacteremia: Secondary | ICD-10-CM

## 2023-03-27 LAB — FUNGITELL BETA-D-GLUCAN
Fungitell Value:: <31.25 pg/mL
Result Name:: NEGATIVE

## 2023-03-27 LAB — BASIC METABOLIC PANEL
Anion gap: 12 (ref 5–15)
BUN: 31 mg/dL — ABNORMAL HIGH (ref 6–20)
CO2: 25 mmol/L (ref 22–32)
Calcium: 8.4 mg/dL — ABNORMAL LOW (ref 8.9–10.3)
Chloride: 102 mmol/L (ref 98–111)
Creatinine, Ser: 1.21 mg/dL — ABNORMAL HIGH (ref 0.44–1.00)
GFR, Estimated: 51 mL/min — ABNORMAL LOW (ref >60)
Glucose, Bld: 164 mg/dL — ABNORMAL HIGH (ref 70–99)
Potassium: 3.4 mmol/L — ABNORMAL LOW (ref 3.5–5.1)
Sodium: 139 mmol/L (ref 135–145)

## 2023-03-27 LAB — GLUCOSE, CAPILLARY
Glucose-Capillary: 118 mg/dL — ABNORMAL HIGH (ref 70–99)
Glucose-Capillary: 327 mg/dL — ABNORMAL HIGH (ref 70–99)

## 2023-03-27 LAB — POCT ACTIVATED CLOTTING TIME: Activated Clotting Time: 226 s

## 2023-03-27 LAB — CBC
HCT: 36.4 % (ref 36.0–46.0)
Hemoglobin: 11.7 g/dL — ABNORMAL LOW (ref 12.0–15.0)
MCH: 30.2 pg (ref 26.0–34.0)
MCHC: 32.1 g/dL (ref 30.0–36.0)
MCV: 93.8 fL (ref 80.0–100.0)
Platelets: 358 10*3/uL (ref 150–400)
RBC: 3.88 MIL/uL (ref 3.87–5.11)
RDW: 13.6 % (ref 11.5–15.5)
WBC: 7.6 10*3/uL (ref 4.0–10.5)
nRBC: 0 % (ref 0.0–0.2)

## 2023-03-27 LAB — MAGNESIUM: Magnesium: 2.2 mg/dL (ref 1.7–2.4)

## 2023-03-27 MED ORDER — SACUBITRIL-VALSARTAN 49-51 MG PO TABS
1.0000 | ORAL_TABLET | Freq: Two times a day (BID) | ORAL | Status: DC
  Administered 2023-03-27: 1 via ORAL
  Filled 2023-03-27: qty 1

## 2023-03-27 MED ORDER — LINAGLIPTIN 5 MG PO TABS: 5.0000 mg | ORAL_TABLET | Freq: Every day | ORAL | 1 refills | Status: AC

## 2023-03-27 MED ORDER — SACUBITRIL-VALSARTAN 49-51 MG PO TABS: 1.0000 | ORAL_TABLET | Freq: Two times a day (BID) | ORAL | 1 refills | Status: AC

## 2023-03-27 MED ORDER — FUROSEMIDE 40 MG PO TABS: 40.0000 mg | ORAL_TABLET | Freq: Two times a day (BID) | ORAL | 1 refills | Status: AC

## 2023-03-27 MED ORDER — BUDESONIDE 0.5 MG/2ML IN SUSP: 0.5000 mg | Freq: Two times a day (BID) | RESPIRATORY_TRACT | 0 refills | Status: AC

## 2023-03-27 MED ORDER — CEFTRIAXONE IV (FOR PTA / DISCHARGE USE ONLY): 2.0000 g | INTRAVENOUS | 0 refills | Status: AC

## 2023-03-27 MED ORDER — ATORVASTATIN CALCIUM 80 MG PO TABS
80.0000 mg | ORAL_TABLET | Freq: Every day | ORAL | Status: DC
  Administered 2023-03-27: 80 mg via ORAL
  Filled 2023-03-27: qty 1

## 2023-03-27 MED ORDER — SODIUM CHLORIDE 0.9% FLUSH: 10.0000 mL | INTRAVENOUS | Status: DC | PRN

## 2023-03-27 MED ORDER — IPRATROPIUM-ALBUTEROL 0.5-2.5 (3) MG/3ML IN SOLN: 3.0000 mL | Freq: Two times a day (BID) | RESPIRATORY_TRACT | 1 refills | Status: DC

## 2023-03-27 MED ORDER — BACITRACIN ZINC 500 UNIT/GM EX OINT: TOPICAL_OINTMENT | Freq: Two times a day (BID) | CUTANEOUS | 0 refills | Status: DC

## 2023-03-27 MED ORDER — METOPROLOL SUCCINATE ER 25 MG PO TB24: 25.0000 mg | ORAL_TABLET | Freq: Two times a day (BID) | ORAL | 1 refills | Status: AC

## 2023-03-27 MED ORDER — SODIUM CHLORIDE 0.9% FLUSH
10.0000 mL | Freq: Two times a day (BID) | INTRAVENOUS | Status: DC
  Administered 2023-03-27: 10 mL

## 2023-03-27 MED ORDER — ATORVASTATIN CALCIUM 80 MG PO TABS: 80.0000 mg | ORAL_TABLET | Freq: Every day | ORAL | 1 refills | Status: AC

## 2023-03-27 MED ORDER — INSULIN ASPART FLEXPEN 100 UNIT/ML ~~LOC~~ SOPN: 10.0000 [IU] | PEN_INJECTOR | Freq: Three times a day (TID) | SUBCUTANEOUS | 1 refills | Status: AC

## 2023-03-27 NOTE — Discharge Summary (Addendum)
CT. Overall, aeration has worsened in the left lung compared to the recent chest x-ray. Electronically Signed   By: Trudie Reed M.D.   On:  03/21/2023 07:22   ECHOCARDIOGRAM COMPLETE  Result Date: 03/20/2023    ECHOCARDIOGRAM REPORT   Patient Name:   Julia Simmons Date of Exam: 03/20/2023 Medical Rec #:  [id number]       Height:       65.0 in Accession #:    [id number]      Weight:       318.0 lb Date of Birth:  1960-09-30        BSA:          2.409 m Patient Age:    62 years        BP:           103/78 mmHg Patient Gender: F               HR:           96 bpm. Exam Location:  [facility] Procedure: 2D Echo, Color Doppler, Cardiac Doppler and Intracardiac            Opacification Agent Indications:     NSTEMI I21.4  History:         Patient has prior history of Echocardiogram examinations, most                  recent 06/17/2020. Risk Factors:Hypertension, Diabetes and                  Sleep Apnea.  Sonographer:     Cristela Blue Referring Phys:  [id number] CARALYN HUDSON Diagnosing Phys: Windell Norfolk  Sonographer Comments: Suboptimal apical window. IMPRESSIONS  1. Left ventricular ejection fraction, by estimation, is 25 to 30%. The left ventricle has severely decreased function. The left ventricle demonstrates regional wall motion abnormalities (see scoring diagram/findings for description). Left ventricular diastolic parameters are consistent with Grade I diastolic dysfunction (impaired relaxation).  2. Right ventricular systolic function was not well visualized. The right ventricular size is not well visualized.  3. The mitral valve is normal in structure. Mild mitral valve regurgitation.  4. Small mobile echogenicity on aortic valve, degenerative change vs vegetation. Consider TEE for further evaluation as clinically indicated.. The aortic valve is calcified. There is mild calcification of the aortic valve. There is moderate thickening of the aortic valve. Aortic valve regurgitation is not visualized. No aortic stenosis is present. FINDINGS  Left Ventricle: Left ventricular ejection fraction, by estimation, is 25 to 30%. The left ventricle has severely  decreased function. The left ventricle demonstrates regional wall motion abnormalities. Definity contrast agent was given IV to delineate the left ventricular endocardial borders. The left ventricular internal cavity size was normal in size. There is no left ventricular hypertrophy. Left ventricular diastolic parameters are consistent with Grade I diastolic dysfunction (impaired relaxation).  LV Wall Scoring: The mid and distal anterior septum, entire apex, and mid anterolateral segment are akinetic. The basal anteroseptal segment, mid inferolateral segment, mid inferoseptal segment, mid anterior segment, mid inferior segment, and basal inferoseptal segment are hypokinetic. The basal inferolateral segment, basal anterolateral segment, basal anterior segment, and basal inferior segment are normal. Right Ventricle: The right ventricular size is not well visualized. Right vetricular wall thickness was not assessed. Right ventricular systolic function was not well visualized. Left Atrium: Left atrial size was normal in size. Right Atrium: Right atrial size was normal in size. Pericardium: There is no evidence of  her husband and adult son.   Objective: Vitals:   03/27/23 0725 03/27/23 1243  BP: (!) 138/58 (!) 156/111  Pulse: 67 73  Resp:    Temp: 98.5 F (36.9 C) 98.4 F (36.9 C)  SpO2: 93% 95%    Intake/Output Summary (Last 24 hours) at 03/27/2023 1317 Last data filed at 03/26/2023 1630 Gross per 24 hour  Intake 480 ml  Output 1500 ml  Net -1020 ml   Filed Weights   03/25/23 1405 03/26/23 0336 03/26/23 1234  Weight: (!) 141.5 kg (!) 141.8 kg (!) 141.8 kg    Exam:  General:  Appears calm and comfortable and is in NAD, on RA Eyes:  EOMI, normal lids, iris ENT:  grossly normal hearing, lips & tongue, mmm Neck:  no LAD, masses or thyromegaly Cardiovascular:  RRR, no m/r/g. 1-2+ chronic LE edema.  Respiratory:   CTA bilaterally with no wheezes/rales/rhonchi.  Normal respiratory effort. Abdomen:  soft, NT, ND Skin:  no rash or induration seen on limited exam Musculoskeletal:  grossly normal tone BUE/BLE, good ROM, no bony abnormality Psychiatric:  blunted mood and affect, speech fluent and appropriate, AOx3 Neurologic:  CN 2-12 grossly intact, moves all extremities in coordinated fashion  Data Reviewed: I have reviewed the patient's lab results since  admission.  Pertinent labs for today include:  K+ 3.4 Glucose 164 BUN 31/Creatinine 1.21/GFR 51, stable WBC 7.6 Hgb 11.7    Condition at discharge: improving  The results of significant diagnostics from this hospitalization (including imaging, microbiology, ancillary and laboratory) are listed below for reference.   Imaging Studies: CARDIAC CATHETERIZATION  Result Date: 03/26/2023   Prox RCA to Mid RCA lesion is 75% stenosed.   Dist RCA lesion is 70% stenosed.   1st Diag lesion is 70% stenosed.   Prox LAD to Mid LAD lesion is 95% stenosed.   Previously placed Ost LAD to Prox LAD stent of unknown type is  widely patent.   There is severe left ventricular systolic dysfunction.   LV end diastolic pressure is moderately elevated.   The left ventricular ejection fraction is 25-35% by visual estimate. 1.  NSTEMI in the setting of urosepsis and severe pneumonia 2.  Two-vessel coronary artery disease with patent stent proximal LAD, high-grade 95% stenosis mid LAD with TIMI II flow, and diffuse 75% stenosis mid, and 70% stenosis distal, small caliber codominant RCA 3.  Severe dilated cardiomyopathy, with anterior akinesis and apical dyskinesis, with estimated LV ejection fraction 25% 4.  Unsuccessful attempt formed PCI of mid LAD stenosis, with failure to cross the lesion with multiple coronary wires Recommendations 1.  Medical therapy 2.  Continue good medical management 3.  Aggressive risk factor modification 4.  Consider referral for ICD   ECHO TEE  Result Date: 03/25/2023    TRANSESOPHOGEAL ECHO REPORT   Patient Name:   Julia Simmons Date of Exam: 03/25/2023 Medical Rec #:  [id number]       Height:       65.0 in Accession #:    [id number]      Weight:       323.4 lb Date of Birth:  February 21, 1961        BSA:          2.426 m Patient Age:    62 years        BP:           129/75 mmHg Patient Gender: F  Physician Discharge Summary   Patient: Julia Simmons MRN: [id number] DOB: [birth year]  Admit date:     03/20/2023  Discharge date: 03/27/23  Discharge Physician: Jonah Blue   PCP: Patrice Paradise, MD   Recommendations at discharge:   You were treated for a serious infection; completing antibiotics as prescribed is essential; you will receive 3 additional days via mid-line IV access (home health will assist). CT showed a possible ureteral abnormality; follow up with urology for further evaluation. Follow up with cardiology in 1 week Follow up with PCP in 1-2 weeks Increase Lipitor dose to 80 mg daily Decrease Toprol XL dose to 25 mg daily Change Lasix to 40 mg twice daily Increase Entresto dose to 49-51 mg twice daily Change Novolog via Flexpen to 10 units 3 days a day with meals Add Tradjenta 5 mg daily Add Pulmicort nebulizer treatments twice daily and Duonebs 4 times daily Wound care: Cleanse R plantar midfoot wound with NS, apply Bacitracin daily, cover with dry gauze and silicone foam.  May lift foam daily to replace Bacitracin.  Change foam every 3 days and prn soiling. Consider GLP-1 as an outpatient to assist with weight loss and diabetes management.  Discharge Diagnoses: Principal Problem:   E. coli septicemia (HCC) Active Problems:   PNA (pneumonia)   Diabetes mellitus with neuropathy (HCC)   Hypertension   Morbid obesity with BMI of 50.0-59.9, adult (HCC)   Sepsis secondary to UTI [facility])   CAD S/P percutaneous coronary angioplasty   Severe sepsis (HCC)   NSTEMI (non-ST elevated myocardial infarction) (HCC)   Acute on chronic systolic CHF (congestive heart failure) (HCC)   Chronic kidney disease, stage 3b [facility])    Hospital Course: 62yo with medical history significant of CAD s/p stents, chronic combined CHF, HTN, IDDM with insulin resistance, HLD, morbid obesity, OSA on CPAP at bedtime, and frequent UTIs who presented on 10/2 with with malaise, dysuria,  chills, SOB, hypoxemia of 85% on RA, and hypotension. Given IVF.  Chest CT with large left-sided PNA.  Blood culture positive for E. coli.  Treated with Rocephin and Zithromax (completed), left lung pneumonia essentially resolved, consistent with aspiration. TEE was performed on 10/7, no vegetation.  Needs antibiotics for 10 to 14 days.  Patient also with NSTEMI s/p cath on 10/8 with multi-vessel nonobstructive stenosis and unsuccessful attempt to stent placement; plan for medical management.   Assessment and Plan:   Left lung pneumonia Patient presented with sepsis physiology with significant tachycardia and tachypnea as well as elevated lactate and hypotension Imaging showed extensive pneumonia involving the entire left lung, thought to be bacterial, procalcitonin level significantly increased.   Patient developed significant respiratory distress and hypoxemia. Patient does not have significant dysphagia, but she vomited the day prior. Repeated chest x-ray 10/4 showed significant improvement in the left lung pneumonia.  Treated with Ceftriaxone and Azithromycin (completed) Improved, no longer needing O2 Add Pulmicort nebulizer treatments twice daily and Duonebs 4 times daily  E. coli septicemia with UTI/pyelonephritis Patient blood culture was positive for E. Coli Echocardiogram showed possible small vegetation in the aortic valve Cardiology consulted CT abdomen/pelvis showed evidence of pyelonephritis, urine culture also showed E. coli. Continue high-dose Rocephin for total of 10 days CT scan also suspect ureter abnormality with possibility of malignancy; outpatient follow-up with urology. TEE performed on 10/7 did not show any vegetation.   Discussed with ID, Dr. Joylene Draft recommended 10 days of Rocephin (additional 3 days after tomorrow), will order a midline.  her husband and adult son.   Objective: Vitals:   03/27/23 0725 03/27/23 1243  BP: (!) 138/58 (!) 156/111  Pulse: 67 73  Resp:    Temp: 98.5 F (36.9 C) 98.4 F (36.9 C)  SpO2: 93% 95%    Intake/Output Summary (Last 24 hours) at 03/27/2023 1317 Last data filed at 03/26/2023 1630 Gross per 24 hour  Intake 480 ml  Output 1500 ml  Net -1020 ml   Filed Weights   03/25/23 1405 03/26/23 0336 03/26/23 1234  Weight: (!) 141.5 kg (!) 141.8 kg (!) 141.8 kg    Exam:  General:  Appears calm and comfortable and is in NAD, on RA Eyes:  EOMI, normal lids, iris ENT:  grossly normal hearing, lips & tongue, mmm Neck:  no LAD, masses or thyromegaly Cardiovascular:  RRR, no m/r/g. 1-2+ chronic LE edema.  Respiratory:   CTA bilaterally with no wheezes/rales/rhonchi.  Normal respiratory effort. Abdomen:  soft, NT, ND Skin:  no rash or induration seen on limited exam Musculoskeletal:  grossly normal tone BUE/BLE, good ROM, no bony abnormality Psychiatric:  blunted mood and affect, speech fluent and appropriate, AOx3 Neurologic:  CN 2-12 grossly intact, moves all extremities in coordinated fashion  Data Reviewed: I have reviewed the patient's lab results since  admission.  Pertinent labs for today include:  K+ 3.4 Glucose 164 BUN 31/Creatinine 1.21/GFR 51, stable WBC 7.6 Hgb 11.7    Condition at discharge: improving  The results of significant diagnostics from this hospitalization (including imaging, microbiology, ancillary and laboratory) are listed below for reference.   Imaging Studies: CARDIAC CATHETERIZATION  Result Date: 03/26/2023   Prox RCA to Mid RCA lesion is 75% stenosed.   Dist RCA lesion is 70% stenosed.   1st Diag lesion is 70% stenosed.   Prox LAD to Mid LAD lesion is 95% stenosed.   Previously placed Ost LAD to Prox LAD stent of unknown type is  widely patent.   There is severe left ventricular systolic dysfunction.   LV end diastolic pressure is moderately elevated.   The left ventricular ejection fraction is 25-35% by visual estimate. 1.  NSTEMI in the setting of urosepsis and severe pneumonia 2.  Two-vessel coronary artery disease with patent stent proximal LAD, high-grade 95% stenosis mid LAD with TIMI II flow, and diffuse 75% stenosis mid, and 70% stenosis distal, small caliber codominant RCA 3.  Severe dilated cardiomyopathy, with anterior akinesis and apical dyskinesis, with estimated LV ejection fraction 25% 4.  Unsuccessful attempt formed PCI of mid LAD stenosis, with failure to cross the lesion with multiple coronary wires Recommendations 1.  Medical therapy 2.  Continue good medical management 3.  Aggressive risk factor modification 4.  Consider referral for ICD   ECHO TEE  Result Date: 03/25/2023    TRANSESOPHOGEAL ECHO REPORT   Patient Name:   Julia Simmons Date of Exam: 03/25/2023 Medical Rec #:  [id number]       Height:       65.0 in Accession #:    [id number]      Weight:       323.4 lb Date of Birth:  February 21, 1961        BSA:          2.426 m Patient Age:    62 years        BP:           129/75 mmHg Patient Gender: F  her husband and adult son.   Objective: Vitals:   03/27/23 0725 03/27/23 1243  BP: (!) 138/58 (!) 156/111  Pulse: 67 73  Resp:    Temp: 98.5 F (36.9 C) 98.4 F (36.9 C)  SpO2: 93% 95%    Intake/Output Summary (Last 24 hours) at 03/27/2023 1317 Last data filed at 03/26/2023 1630 Gross per 24 hour  Intake 480 ml  Output 1500 ml  Net -1020 ml   Filed Weights   03/25/23 1405 03/26/23 0336 03/26/23 1234  Weight: (!) 141.5 kg (!) 141.8 kg (!) 141.8 kg    Exam:  General:  Appears calm and comfortable and is in NAD, on RA Eyes:  EOMI, normal lids, iris ENT:  grossly normal hearing, lips & tongue, mmm Neck:  no LAD, masses or thyromegaly Cardiovascular:  RRR, no m/r/g. 1-2+ chronic LE edema.  Respiratory:   CTA bilaterally with no wheezes/rales/rhonchi.  Normal respiratory effort. Abdomen:  soft, NT, ND Skin:  no rash or induration seen on limited exam Musculoskeletal:  grossly normal tone BUE/BLE, good ROM, no bony abnormality Psychiatric:  blunted mood and affect, speech fluent and appropriate, AOx3 Neurologic:  CN 2-12 grossly intact, moves all extremities in coordinated fashion  Data Reviewed: I have reviewed the patient's lab results since  admission.  Pertinent labs for today include:  K+ 3.4 Glucose 164 BUN 31/Creatinine 1.21/GFR 51, stable WBC 7.6 Hgb 11.7    Condition at discharge: improving  The results of significant diagnostics from this hospitalization (including imaging, microbiology, ancillary and laboratory) are listed below for reference.   Imaging Studies: CARDIAC CATHETERIZATION  Result Date: 03/26/2023   Prox RCA to Mid RCA lesion is 75% stenosed.   Dist RCA lesion is 70% stenosed.   1st Diag lesion is 70% stenosed.   Prox LAD to Mid LAD lesion is 95% stenosed.   Previously placed Ost LAD to Prox LAD stent of unknown type is  widely patent.   There is severe left ventricular systolic dysfunction.   LV end diastolic pressure is moderately elevated.   The left ventricular ejection fraction is 25-35% by visual estimate. 1.  NSTEMI in the setting of urosepsis and severe pneumonia 2.  Two-vessel coronary artery disease with patent stent proximal LAD, high-grade 95% stenosis mid LAD with TIMI II flow, and diffuse 75% stenosis mid, and 70% stenosis distal, small caliber codominant RCA 3.  Severe dilated cardiomyopathy, with anterior akinesis and apical dyskinesis, with estimated LV ejection fraction 25% 4.  Unsuccessful attempt formed PCI of mid LAD stenosis, with failure to cross the lesion with multiple coronary wires Recommendations 1.  Medical therapy 2.  Continue good medical management 3.  Aggressive risk factor modification 4.  Consider referral for ICD   ECHO TEE  Result Date: 03/25/2023    TRANSESOPHOGEAL ECHO REPORT   Patient Name:   Julia Simmons Date of Exam: 03/25/2023 Medical Rec #:  [id number]       Height:       65.0 in Accession #:    [id number]      Weight:       323.4 lb Date of Birth:  February 21, 1961        BSA:          2.426 m Patient Age:    62 years        BP:           129/75 mmHg Patient Gender: F  her husband and adult son.   Objective: Vitals:   03/27/23 0725 03/27/23 1243  BP: (!) 138/58 (!) 156/111  Pulse: 67 73  Resp:    Temp: 98.5 F (36.9 C) 98.4 F (36.9 C)  SpO2: 93% 95%    Intake/Output Summary (Last 24 hours) at 03/27/2023 1317 Last data filed at 03/26/2023 1630 Gross per 24 hour  Intake 480 ml  Output 1500 ml  Net -1020 ml   Filed Weights   03/25/23 1405 03/26/23 0336 03/26/23 1234  Weight: (!) 141.5 kg (!) 141.8 kg (!) 141.8 kg    Exam:  General:  Appears calm and comfortable and is in NAD, on RA Eyes:  EOMI, normal lids, iris ENT:  grossly normal hearing, lips & tongue, mmm Neck:  no LAD, masses or thyromegaly Cardiovascular:  RRR, no m/r/g. 1-2+ chronic LE edema.  Respiratory:   CTA bilaterally with no wheezes/rales/rhonchi.  Normal respiratory effort. Abdomen:  soft, NT, ND Skin:  no rash or induration seen on limited exam Musculoskeletal:  grossly normal tone BUE/BLE, good ROM, no bony abnormality Psychiatric:  blunted mood and affect, speech fluent and appropriate, AOx3 Neurologic:  CN 2-12 grossly intact, moves all extremities in coordinated fashion  Data Reviewed: I have reviewed the patient's lab results since  admission.  Pertinent labs for today include:  K+ 3.4 Glucose 164 BUN 31/Creatinine 1.21/GFR 51, stable WBC 7.6 Hgb 11.7    Condition at discharge: improving  The results of significant diagnostics from this hospitalization (including imaging, microbiology, ancillary and laboratory) are listed below for reference.   Imaging Studies: CARDIAC CATHETERIZATION  Result Date: 03/26/2023   Prox RCA to Mid RCA lesion is 75% stenosed.   Dist RCA lesion is 70% stenosed.   1st Diag lesion is 70% stenosed.   Prox LAD to Mid LAD lesion is 95% stenosed.   Previously placed Ost LAD to Prox LAD stent of unknown type is  widely patent.   There is severe left ventricular systolic dysfunction.   LV end diastolic pressure is moderately elevated.   The left ventricular ejection fraction is 25-35% by visual estimate. 1.  NSTEMI in the setting of urosepsis and severe pneumonia 2.  Two-vessel coronary artery disease with patent stent proximal LAD, high-grade 95% stenosis mid LAD with TIMI II flow, and diffuse 75% stenosis mid, and 70% stenosis distal, small caliber codominant RCA 3.  Severe dilated cardiomyopathy, with anterior akinesis and apical dyskinesis, with estimated LV ejection fraction 25% 4.  Unsuccessful attempt formed PCI of mid LAD stenosis, with failure to cross the lesion with multiple coronary wires Recommendations 1.  Medical therapy 2.  Continue good medical management 3.  Aggressive risk factor modification 4.  Consider referral for ICD   ECHO TEE  Result Date: 03/25/2023    TRANSESOPHOGEAL ECHO REPORT   Patient Name:   Julia Simmons Date of Exam: 03/25/2023 Medical Rec #:  [id number]       Height:       65.0 in Accession #:    [id number]      Weight:       323.4 lb Date of Birth:  February 21, 1961        BSA:          2.426 m Patient Age:    62 years        BP:           129/75 mmHg Patient Gender: F  Physician Discharge Summary   Patient: Julia Simmons MRN: [id number] DOB: [birth year]  Admit date:     03/20/2023  Discharge date: 03/27/23  Discharge Physician: Jonah Blue   PCP: Patrice Paradise, MD   Recommendations at discharge:   You were treated for a serious infection; completing antibiotics as prescribed is essential; you will receive 3 additional days via mid-line IV access (home health will assist). CT showed a possible ureteral abnormality; follow up with urology for further evaluation. Follow up with cardiology in 1 week Follow up with PCP in 1-2 weeks Increase Lipitor dose to 80 mg daily Decrease Toprol XL dose to 25 mg daily Change Lasix to 40 mg twice daily Increase Entresto dose to 49-51 mg twice daily Change Novolog via Flexpen to 10 units 3 days a day with meals Add Tradjenta 5 mg daily Add Pulmicort nebulizer treatments twice daily and Duonebs 4 times daily Wound care: Cleanse R plantar midfoot wound with NS, apply Bacitracin daily, cover with dry gauze and silicone foam.  May lift foam daily to replace Bacitracin.  Change foam every 3 days and prn soiling. Consider GLP-1 as an outpatient to assist with weight loss and diabetes management.  Discharge Diagnoses: Principal Problem:   E. coli septicemia (HCC) Active Problems:   PNA (pneumonia)   Diabetes mellitus with neuropathy (HCC)   Hypertension   Morbid obesity with BMI of 50.0-59.9, adult (HCC)   Sepsis secondary to UTI [facility])   CAD S/P percutaneous coronary angioplasty   Severe sepsis (HCC)   NSTEMI (non-ST elevated myocardial infarction) (HCC)   Acute on chronic systolic CHF (congestive heart failure) (HCC)   Chronic kidney disease, stage 3b [facility])    Hospital Course: 62yo with medical history significant of CAD s/p stents, chronic combined CHF, HTN, IDDM with insulin resistance, HLD, morbid obesity, OSA on CPAP at bedtime, and frequent UTIs who presented on 10/2 with with malaise, dysuria,  chills, SOB, hypoxemia of 85% on RA, and hypotension. Given IVF.  Chest CT with large left-sided PNA.  Blood culture positive for E. coli.  Treated with Rocephin and Zithromax (completed), left lung pneumonia essentially resolved, consistent with aspiration. TEE was performed on 10/7, no vegetation.  Needs antibiotics for 10 to 14 days.  Patient also with NSTEMI s/p cath on 10/8 with multi-vessel nonobstructive stenosis and unsuccessful attempt to stent placement; plan for medical management.   Assessment and Plan:   Left lung pneumonia Patient presented with sepsis physiology with significant tachycardia and tachypnea as well as elevated lactate and hypotension Imaging showed extensive pneumonia involving the entire left lung, thought to be bacterial, procalcitonin level significantly increased.   Patient developed significant respiratory distress and hypoxemia. Patient does not have significant dysphagia, but she vomited the day prior. Repeated chest x-ray 10/4 showed significant improvement in the left lung pneumonia.  Treated with Ceftriaxone and Azithromycin (completed) Improved, no longer needing O2 Add Pulmicort nebulizer treatments twice daily and Duonebs 4 times daily  E. coli septicemia with UTI/pyelonephritis Patient blood culture was positive for E. Coli Echocardiogram showed possible small vegetation in the aortic valve Cardiology consulted CT abdomen/pelvis showed evidence of pyelonephritis, urine culture also showed E. coli. Continue high-dose Rocephin for total of 10 days CT scan also suspect ureter abnormality with possibility of malignancy; outpatient follow-up with urology. TEE performed on 10/7 did not show any vegetation.   Discussed with ID, Dr. Joylene Draft recommended 10 days of Rocephin (additional 3 days after tomorrow), will order a midline.  her husband and adult son.   Objective: Vitals:   03/27/23 0725 03/27/23 1243  BP: (!) 138/58 (!) 156/111  Pulse: 67 73  Resp:    Temp: 98.5 F (36.9 C) 98.4 F (36.9 C)  SpO2: 93% 95%    Intake/Output Summary (Last 24 hours) at 03/27/2023 1317 Last data filed at 03/26/2023 1630 Gross per 24 hour  Intake 480 ml  Output 1500 ml  Net -1020 ml   Filed Weights   03/25/23 1405 03/26/23 0336 03/26/23 1234  Weight: (!) 141.5 kg (!) 141.8 kg (!) 141.8 kg    Exam:  General:  Appears calm and comfortable and is in NAD, on RA Eyes:  EOMI, normal lids, iris ENT:  grossly normal hearing, lips & tongue, mmm Neck:  no LAD, masses or thyromegaly Cardiovascular:  RRR, no m/r/g. 1-2+ chronic LE edema.  Respiratory:   CTA bilaterally with no wheezes/rales/rhonchi.  Normal respiratory effort. Abdomen:  soft, NT, ND Skin:  no rash or induration seen on limited exam Musculoskeletal:  grossly normal tone BUE/BLE, good ROM, no bony abnormality Psychiatric:  blunted mood and affect, speech fluent and appropriate, AOx3 Neurologic:  CN 2-12 grossly intact, moves all extremities in coordinated fashion  Data Reviewed: I have reviewed the patient's lab results since  admission.  Pertinent labs for today include:  K+ 3.4 Glucose 164 BUN 31/Creatinine 1.21/GFR 51, stable WBC 7.6 Hgb 11.7    Condition at discharge: improving  The results of significant diagnostics from this hospitalization (including imaging, microbiology, ancillary and laboratory) are listed below for reference.   Imaging Studies: CARDIAC CATHETERIZATION  Result Date: 03/26/2023   Prox RCA to Mid RCA lesion is 75% stenosed.   Dist RCA lesion is 70% stenosed.   1st Diag lesion is 70% stenosed.   Prox LAD to Mid LAD lesion is 95% stenosed.   Previously placed Ost LAD to Prox LAD stent of unknown type is  widely patent.   There is severe left ventricular systolic dysfunction.   LV end diastolic pressure is moderately elevated.   The left ventricular ejection fraction is 25-35% by visual estimate. 1.  NSTEMI in the setting of urosepsis and severe pneumonia 2.  Two-vessel coronary artery disease with patent stent proximal LAD, high-grade 95% stenosis mid LAD with TIMI II flow, and diffuse 75% stenosis mid, and 70% stenosis distal, small caliber codominant RCA 3.  Severe dilated cardiomyopathy, with anterior akinesis and apical dyskinesis, with estimated LV ejection fraction 25% 4.  Unsuccessful attempt formed PCI of mid LAD stenosis, with failure to cross the lesion with multiple coronary wires Recommendations 1.  Medical therapy 2.  Continue good medical management 3.  Aggressive risk factor modification 4.  Consider referral for ICD   ECHO TEE  Result Date: 03/25/2023    TRANSESOPHOGEAL ECHO REPORT   Patient Name:   Julia Simmons Date of Exam: 03/25/2023 Medical Rec #:  [id number]       Height:       65.0 in Accession #:    [id number]      Weight:       323.4 lb Date of Birth:  February 21, 1961        BSA:          2.426 m Patient Age:    62 years        BP:           129/75 mmHg Patient Gender: F  Physician Discharge Summary   Patient: Julia Simmons MRN: [id number] DOB: [birth year]  Admit date:     03/20/2023  Discharge date: 03/27/23  Discharge Physician: Jonah Blue   PCP: Patrice Paradise, MD   Recommendations at discharge:   You were treated for a serious infection; completing antibiotics as prescribed is essential; you will receive 3 additional days via mid-line IV access (home health will assist). CT showed a possible ureteral abnormality; follow up with urology for further evaluation. Follow up with cardiology in 1 week Follow up with PCP in 1-2 weeks Increase Lipitor dose to 80 mg daily Decrease Toprol XL dose to 25 mg daily Change Lasix to 40 mg twice daily Increase Entresto dose to 49-51 mg twice daily Change Novolog via Flexpen to 10 units 3 days a day with meals Add Tradjenta 5 mg daily Add Pulmicort nebulizer treatments twice daily and Duonebs 4 times daily Wound care: Cleanse R plantar midfoot wound with NS, apply Bacitracin daily, cover with dry gauze and silicone foam.  May lift foam daily to replace Bacitracin.  Change foam every 3 days and prn soiling. Consider GLP-1 as an outpatient to assist with weight loss and diabetes management.  Discharge Diagnoses: Principal Problem:   E. coli septicemia (HCC) Active Problems:   PNA (pneumonia)   Diabetes mellitus with neuropathy (HCC)   Hypertension   Morbid obesity with BMI of 50.0-59.9, adult (HCC)   Sepsis secondary to UTI [facility])   CAD S/P percutaneous coronary angioplasty   Severe sepsis (HCC)   NSTEMI (non-ST elevated myocardial infarction) (HCC)   Acute on chronic systolic CHF (congestive heart failure) (HCC)   Chronic kidney disease, stage 3b [facility])    Hospital Course: 62yo with medical history significant of CAD s/p stents, chronic combined CHF, HTN, IDDM with insulin resistance, HLD, morbid obesity, OSA on CPAP at bedtime, and frequent UTIs who presented on 10/2 with with malaise, dysuria,  chills, SOB, hypoxemia of 85% on RA, and hypotension. Given IVF.  Chest CT with large left-sided PNA.  Blood culture positive for E. coli.  Treated with Rocephin and Zithromax (completed), left lung pneumonia essentially resolved, consistent with aspiration. TEE was performed on 10/7, no vegetation.  Needs antibiotics for 10 to 14 days.  Patient also with NSTEMI s/p cath on 10/8 with multi-vessel nonobstructive stenosis and unsuccessful attempt to stent placement; plan for medical management.   Assessment and Plan:   Left lung pneumonia Patient presented with sepsis physiology with significant tachycardia and tachypnea as well as elevated lactate and hypotension Imaging showed extensive pneumonia involving the entire left lung, thought to be bacterial, procalcitonin level significantly increased.   Patient developed significant respiratory distress and hypoxemia. Patient does not have significant dysphagia, but she vomited the day prior. Repeated chest x-ray 10/4 showed significant improvement in the left lung pneumonia.  Treated with Ceftriaxone and Azithromycin (completed) Improved, no longer needing O2 Add Pulmicort nebulizer treatments twice daily and Duonebs 4 times daily  E. coli septicemia with UTI/pyelonephritis Patient blood culture was positive for E. Coli Echocardiogram showed possible small vegetation in the aortic valve Cardiology consulted CT abdomen/pelvis showed evidence of pyelonephritis, urine culture also showed E. coli. Continue high-dose Rocephin for total of 10 days CT scan also suspect ureter abnormality with possibility of malignancy; outpatient follow-up with urology. TEE performed on 10/7 did not show any vegetation.   Discussed with ID, Dr. Joylene Draft recommended 10 days of Rocephin (additional 3 days after tomorrow), will order a midline.  Physician Discharge Summary   Patient: Julia Simmons MRN: [id number] DOB: [birth year]  Admit date:     03/20/2023  Discharge date: 03/27/23  Discharge Physician: Jonah Blue   PCP: Patrice Paradise, MD   Recommendations at discharge:   You were treated for a serious infection; completing antibiotics as prescribed is essential; you will receive 3 additional days via mid-line IV access (home health will assist). CT showed a possible ureteral abnormality; follow up with urology for further evaluation. Follow up with cardiology in 1 week Follow up with PCP in 1-2 weeks Increase Lipitor dose to 80 mg daily Decrease Toprol XL dose to 25 mg daily Change Lasix to 40 mg twice daily Increase Entresto dose to 49-51 mg twice daily Change Novolog via Flexpen to 10 units 3 days a day with meals Add Tradjenta 5 mg daily Add Pulmicort nebulizer treatments twice daily and Duonebs 4 times daily Wound care: Cleanse R plantar midfoot wound with NS, apply Bacitracin daily, cover with dry gauze and silicone foam.  May lift foam daily to replace Bacitracin.  Change foam every 3 days and prn soiling. Consider GLP-1 as an outpatient to assist with weight loss and diabetes management.  Discharge Diagnoses: Principal Problem:   E. coli septicemia (HCC) Active Problems:   PNA (pneumonia)   Diabetes mellitus with neuropathy (HCC)   Hypertension   Morbid obesity with BMI of 50.0-59.9, adult (HCC)   Sepsis secondary to UTI [facility])   CAD S/P percutaneous coronary angioplasty   Severe sepsis (HCC)   NSTEMI (non-ST elevated myocardial infarction) (HCC)   Acute on chronic systolic CHF (congestive heart failure) (HCC)   Chronic kidney disease, stage 3b [facility])    Hospital Course: 62yo with medical history significant of CAD s/p stents, chronic combined CHF, HTN, IDDM with insulin resistance, HLD, morbid obesity, OSA on CPAP at bedtime, and frequent UTIs who presented on 10/2 with with malaise, dysuria,  chills, SOB, hypoxemia of 85% on RA, and hypotension. Given IVF.  Chest CT with large left-sided PNA.  Blood culture positive for E. coli.  Treated with Rocephin and Zithromax (completed), left lung pneumonia essentially resolved, consistent with aspiration. TEE was performed on 10/7, no vegetation.  Needs antibiotics for 10 to 14 days.  Patient also with NSTEMI s/p cath on 10/8 with multi-vessel nonobstructive stenosis and unsuccessful attempt to stent placement; plan for medical management.   Assessment and Plan:   Left lung pneumonia Patient presented with sepsis physiology with significant tachycardia and tachypnea as well as elevated lactate and hypotension Imaging showed extensive pneumonia involving the entire left lung, thought to be bacterial, procalcitonin level significantly increased.   Patient developed significant respiratory distress and hypoxemia. Patient does not have significant dysphagia, but she vomited the day prior. Repeated chest x-ray 10/4 showed significant improvement in the left lung pneumonia.  Treated with Ceftriaxone and Azithromycin (completed) Improved, no longer needing O2 Add Pulmicort nebulizer treatments twice daily and Duonebs 4 times daily  E. coli septicemia with UTI/pyelonephritis Patient blood culture was positive for E. Coli Echocardiogram showed possible small vegetation in the aortic valve Cardiology consulted CT abdomen/pelvis showed evidence of pyelonephritis, urine culture also showed E. coli. Continue high-dose Rocephin for total of 10 days CT scan also suspect ureter abnormality with possibility of malignancy; outpatient follow-up with urology. TEE performed on 10/7 did not show any vegetation.   Discussed with ID, Dr. Joylene Draft recommended 10 days of Rocephin (additional 3 days after tomorrow), will order a midline.  her husband and adult son.   Objective: Vitals:   03/27/23 0725 03/27/23 1243  BP: (!) 138/58 (!) 156/111  Pulse: 67 73  Resp:    Temp: 98.5 F (36.9 C) 98.4 F (36.9 C)  SpO2: 93% 95%    Intake/Output Summary (Last 24 hours) at 03/27/2023 1317 Last data filed at 03/26/2023 1630 Gross per 24 hour  Intake 480 ml  Output 1500 ml  Net -1020 ml   Filed Weights   03/25/23 1405 03/26/23 0336 03/26/23 1234  Weight: (!) 141.5 kg (!) 141.8 kg (!) 141.8 kg    Exam:  General:  Appears calm and comfortable and is in NAD, on RA Eyes:  EOMI, normal lids, iris ENT:  grossly normal hearing, lips & tongue, mmm Neck:  no LAD, masses or thyromegaly Cardiovascular:  RRR, no m/r/g. 1-2+ chronic LE edema.  Respiratory:   CTA bilaterally with no wheezes/rales/rhonchi.  Normal respiratory effort. Abdomen:  soft, NT, ND Skin:  no rash or induration seen on limited exam Musculoskeletal:  grossly normal tone BUE/BLE, good ROM, no bony abnormality Psychiatric:  blunted mood and affect, speech fluent and appropriate, AOx3 Neurologic:  CN 2-12 grossly intact, moves all extremities in coordinated fashion  Data Reviewed: I have reviewed the patient's lab results since  admission.  Pertinent labs for today include:  K+ 3.4 Glucose 164 BUN 31/Creatinine 1.21/GFR 51, stable WBC 7.6 Hgb 11.7    Condition at discharge: improving  The results of significant diagnostics from this hospitalization (including imaging, microbiology, ancillary and laboratory) are listed below for reference.   Imaging Studies: CARDIAC CATHETERIZATION  Result Date: 03/26/2023   Prox RCA to Mid RCA lesion is 75% stenosed.   Dist RCA lesion is 70% stenosed.   1st Diag lesion is 70% stenosed.   Prox LAD to Mid LAD lesion is 95% stenosed.   Previously placed Ost LAD to Prox LAD stent of unknown type is  widely patent.   There is severe left ventricular systolic dysfunction.   LV end diastolic pressure is moderately elevated.   The left ventricular ejection fraction is 25-35% by visual estimate. 1.  NSTEMI in the setting of urosepsis and severe pneumonia 2.  Two-vessel coronary artery disease with patent stent proximal LAD, high-grade 95% stenosis mid LAD with TIMI II flow, and diffuse 75% stenosis mid, and 70% stenosis distal, small caliber codominant RCA 3.  Severe dilated cardiomyopathy, with anterior akinesis and apical dyskinesis, with estimated LV ejection fraction 25% 4.  Unsuccessful attempt formed PCI of mid LAD stenosis, with failure to cross the lesion with multiple coronary wires Recommendations 1.  Medical therapy 2.  Continue good medical management 3.  Aggressive risk factor modification 4.  Consider referral for ICD   ECHO TEE  Result Date: 03/25/2023    TRANSESOPHOGEAL ECHO REPORT   Patient Name:   Julia Simmons Date of Exam: 03/25/2023 Medical Rec #:  [id number]       Height:       65.0 in Accession #:    [id number]      Weight:       323.4 lb Date of Birth:  February 21, 1961        BSA:          2.426 m Patient Age:    62 years        BP:           129/75 mmHg Patient Gender: F  CT. Overall, aeration has worsened in the left lung compared to the recent chest x-ray. Electronically Signed   By: Trudie Reed M.D.   On:  03/21/2023 07:22   ECHOCARDIOGRAM COMPLETE  Result Date: 03/20/2023    ECHOCARDIOGRAM REPORT   Patient Name:   Julia Simmons Date of Exam: 03/20/2023 Medical Rec #:  [id number]       Height:       65.0 in Accession #:    [id number]      Weight:       318.0 lb Date of Birth:  1960-09-30        BSA:          2.409 m Patient Age:    62 years        BP:           103/78 mmHg Patient Gender: F               HR:           96 bpm. Exam Location:  [facility] Procedure: 2D Echo, Color Doppler, Cardiac Doppler and Intracardiac            Opacification Agent Indications:     NSTEMI I21.4  History:         Patient has prior history of Echocardiogram examinations, most                  recent 06/17/2020. Risk Factors:Hypertension, Diabetes and                  Sleep Apnea.  Sonographer:     Cristela Blue Referring Phys:  [id number] CARALYN HUDSON Diagnosing Phys: Windell Norfolk  Sonographer Comments: Suboptimal apical window. IMPRESSIONS  1. Left ventricular ejection fraction, by estimation, is 25 to 30%. The left ventricle has severely decreased function. The left ventricle demonstrates regional wall motion abnormalities (see scoring diagram/findings for description). Left ventricular diastolic parameters are consistent with Grade I diastolic dysfunction (impaired relaxation).  2. Right ventricular systolic function was not well visualized. The right ventricular size is not well visualized.  3. The mitral valve is normal in structure. Mild mitral valve regurgitation.  4. Small mobile echogenicity on aortic valve, degenerative change vs vegetation. Consider TEE for further evaluation as clinically indicated.. The aortic valve is calcified. There is mild calcification of the aortic valve. There is moderate thickening of the aortic valve. Aortic valve regurgitation is not visualized. No aortic stenosis is present. FINDINGS  Left Ventricle: Left ventricular ejection fraction, by estimation, is 25 to 30%. The left ventricle has severely  decreased function. The left ventricle demonstrates regional wall motion abnormalities. Definity contrast agent was given IV to delineate the left ventricular endocardial borders. The left ventricular internal cavity size was normal in size. There is no left ventricular hypertrophy. Left ventricular diastolic parameters are consistent with Grade I diastolic dysfunction (impaired relaxation).  LV Wall Scoring: The mid and distal anterior septum, entire apex, and mid anterolateral segment are akinetic. The basal anteroseptal segment, mid inferolateral segment, mid inferoseptal segment, mid anterior segment, mid inferior segment, and basal inferoseptal segment are hypokinetic. The basal inferolateral segment, basal anterolateral segment, basal anterior segment, and basal inferior segment are normal. Right Ventricle: The right ventricular size is not well visualized. Right vetricular wall thickness was not assessed. Right ventricular systolic function was not well visualized. Left Atrium: Left atrial size was normal in size. Right Atrium: Right atrial size was normal in size. Pericardium: There is no evidence of  CT. Overall, aeration has worsened in the left lung compared to the recent chest x-ray. Electronically Signed   By: Trudie Reed M.D.   On:  03/21/2023 07:22   ECHOCARDIOGRAM COMPLETE  Result Date: 03/20/2023    ECHOCARDIOGRAM REPORT   Patient Name:   Julia Simmons Date of Exam: 03/20/2023 Medical Rec #:  [id number]       Height:       65.0 in Accession #:    [id number]      Weight:       318.0 lb Date of Birth:  1960-09-30        BSA:          2.409 m Patient Age:    62 years        BP:           103/78 mmHg Patient Gender: F               HR:           96 bpm. Exam Location:  [facility] Procedure: 2D Echo, Color Doppler, Cardiac Doppler and Intracardiac            Opacification Agent Indications:     NSTEMI I21.4  History:         Patient has prior history of Echocardiogram examinations, most                  recent 06/17/2020. Risk Factors:Hypertension, Diabetes and                  Sleep Apnea.  Sonographer:     Cristela Blue Referring Phys:  [id number] CARALYN HUDSON Diagnosing Phys: Windell Norfolk  Sonographer Comments: Suboptimal apical window. IMPRESSIONS  1. Left ventricular ejection fraction, by estimation, is 25 to 30%. The left ventricle has severely decreased function. The left ventricle demonstrates regional wall motion abnormalities (see scoring diagram/findings for description). Left ventricular diastolic parameters are consistent with Grade I diastolic dysfunction (impaired relaxation).  2. Right ventricular systolic function was not well visualized. The right ventricular size is not well visualized.  3. The mitral valve is normal in structure. Mild mitral valve regurgitation.  4. Small mobile echogenicity on aortic valve, degenerative change vs vegetation. Consider TEE for further evaluation as clinically indicated.. The aortic valve is calcified. There is mild calcification of the aortic valve. There is moderate thickening of the aortic valve. Aortic valve regurgitation is not visualized. No aortic stenosis is present. FINDINGS  Left Ventricle: Left ventricular ejection fraction, by estimation, is 25 to 30%. The left ventricle has severely  decreased function. The left ventricle demonstrates regional wall motion abnormalities. Definity contrast agent was given IV to delineate the left ventricular endocardial borders. The left ventricular internal cavity size was normal in size. There is no left ventricular hypertrophy. Left ventricular diastolic parameters are consistent with Grade I diastolic dysfunction (impaired relaxation).  LV Wall Scoring: The mid and distal anterior septum, entire apex, and mid anterolateral segment are akinetic. The basal anteroseptal segment, mid inferolateral segment, mid inferoseptal segment, mid anterior segment, mid inferior segment, and basal inferoseptal segment are hypokinetic. The basal inferolateral segment, basal anterolateral segment, basal anterior segment, and basal inferior segment are normal. Right Ventricle: The right ventricular size is not well visualized. Right vetricular wall thickness was not assessed. Right ventricular systolic function was not well visualized. Left Atrium: Left atrial size was normal in size. Right Atrium: Right atrial size was normal in size. Pericardium: There is no evidence of  Physician Discharge Summary   Patient: Julia Simmons MRN: [id number] DOB: [birth year]  Admit date:     03/20/2023  Discharge date: 03/27/23  Discharge Physician: Jonah Blue   PCP: Patrice Paradise, MD   Recommendations at discharge:   You were treated for a serious infection; completing antibiotics as prescribed is essential; you will receive 3 additional days via mid-line IV access (home health will assist). CT showed a possible ureteral abnormality; follow up with urology for further evaluation. Follow up with cardiology in 1 week Follow up with PCP in 1-2 weeks Increase Lipitor dose to 80 mg daily Decrease Toprol XL dose to 25 mg daily Change Lasix to 40 mg twice daily Increase Entresto dose to 49-51 mg twice daily Change Novolog via Flexpen to 10 units 3 days a day with meals Add Tradjenta 5 mg daily Add Pulmicort nebulizer treatments twice daily and Duonebs 4 times daily Wound care: Cleanse R plantar midfoot wound with NS, apply Bacitracin daily, cover with dry gauze and silicone foam.  May lift foam daily to replace Bacitracin.  Change foam every 3 days and prn soiling. Consider GLP-1 as an outpatient to assist with weight loss and diabetes management.  Discharge Diagnoses: Principal Problem:   E. coli septicemia (HCC) Active Problems:   PNA (pneumonia)   Diabetes mellitus with neuropathy (HCC)   Hypertension   Morbid obesity with BMI of 50.0-59.9, adult (HCC)   Sepsis secondary to UTI [facility])   CAD S/P percutaneous coronary angioplasty   Severe sepsis (HCC)   NSTEMI (non-ST elevated myocardial infarction) (HCC)   Acute on chronic systolic CHF (congestive heart failure) (HCC)   Chronic kidney disease, stage 3b [facility])    Hospital Course: 62yo with medical history significant of CAD s/p stents, chronic combined CHF, HTN, IDDM with insulin resistance, HLD, morbid obesity, OSA on CPAP at bedtime, and frequent UTIs who presented on 10/2 with with malaise, dysuria,  chills, SOB, hypoxemia of 85% on RA, and hypotension. Given IVF.  Chest CT with large left-sided PNA.  Blood culture positive for E. coli.  Treated with Rocephin and Zithromax (completed), left lung pneumonia essentially resolved, consistent with aspiration. TEE was performed on 10/7, no vegetation.  Needs antibiotics for 10 to 14 days.  Patient also with NSTEMI s/p cath on 10/8 with multi-vessel nonobstructive stenosis and unsuccessful attempt to stent placement; plan for medical management.   Assessment and Plan:   Left lung pneumonia Patient presented with sepsis physiology with significant tachycardia and tachypnea as well as elevated lactate and hypotension Imaging showed extensive pneumonia involving the entire left lung, thought to be bacterial, procalcitonin level significantly increased.   Patient developed significant respiratory distress and hypoxemia. Patient does not have significant dysphagia, but she vomited the day prior. Repeated chest x-ray 10/4 showed significant improvement in the left lung pneumonia.  Treated with Ceftriaxone and Azithromycin (completed) Improved, no longer needing O2 Add Pulmicort nebulizer treatments twice daily and Duonebs 4 times daily  E. coli septicemia with UTI/pyelonephritis Patient blood culture was positive for E. Coli Echocardiogram showed possible small vegetation in the aortic valve Cardiology consulted CT abdomen/pelvis showed evidence of pyelonephritis, urine culture also showed E. coli. Continue high-dose Rocephin for total of 10 days CT scan also suspect ureter abnormality with possibility of malignancy; outpatient follow-up with urology. TEE performed on 10/7 did not show any vegetation.   Discussed with ID, Dr. Joylene Draft recommended 10 days of Rocephin (additional 3 days after tomorrow), will order a midline.

## 2023-03-27 NOTE — TOC Progression Note (Addendum)
Transition of Care [facility]) - Progression Note    Patient Details  Name: Julia Simmons MRN: [id number] Date of Birth: Oct 19, 1960  Transition of Care [facility]) CM/SW Contact  Truddie Hidden, RN Phone Number: 03/27/2023, 12:57 PM  Clinical Narrative:    Spoke with patient and her son at the bedside regarding HH and infusions. Patient stated she has had [facility] in the past. She sis agreeable to any [facility] agency. Her son will be able to transport her home at discharge. She was also advised pam from [employer] would be coming today to do teaching for home infusions.   Referrals for [facility] sent and accepted by  [facility] from [place].           Expected Discharge Plan and Services                                               Social Determinants of Health (SDOH) Interventions SDOH Screenings   Food Insecurity: No Food Insecurity (03/20/2023)  Housing: Low Risk  (03/20/2023)  Transportation Needs: No Transportation Needs (03/20/2023)  Utilities: Not At Risk (03/20/2023)  Financial Resource Strain: Low Risk  (02/25/2023)   Received from [facility]  Social Connections: Unknown (12/03/2021)   Received from [facility]  Stress: Patient Declined (12/03/2021)   Received from [facility]  Tobacco Use: Low Risk  (03/25/2023)    Readmission Risk Interventions     No data to display

## 2023-03-27 NOTE — Progress Notes (Signed)
[facility] HF Stewardship  PCP: Patrice Paradise, MD  PCP-Cardiologist: None  HPI: Julia Simmons is a 62 y.o. female with hypertension, hyperlipidemia, type 2 diabetes, OSA on CPAP, morbid obesity, CAD s/p DES to LAD 11/2021, ischemic cardiomyopathy, chronic HFrEF (EF 35-40% 11/2021), R MCA CVA 11/2021, who presented with general malaise, dysuria, and chills.  Troponin was 507 on admission, trending up to a peak of 10,699 before trending back down. Suspected type I MI, however may also be type II MI. BNP normal on admission. Lactate of 2.4 on admission. PCT markedly elevated at 24. CTA negative for PE but suggestive of multifocal pneumonia. Bcx showed E. coli bacteremia. TEE 03/25/23 showed no evidence of valve vegetation. Underwent LHC on 03/26/23 where previous LAD stent was patent, culprit lesion suspected to be 95% stenosis of proximal to mid LAD. PCI was attempted, however the lesion could not be crossed with multiple wires.   Pertinent cardiac history: TTE in 05/2020 with LVEF of 60-65%, G2DD, normal RV. Cardiac cath at [facility] in 11/2021 showed LAD stenosis treated with DES to the proximal and mid segments requiring Shockwave lithotripsy. Noted ot have 75% stenosis of the RCA at the time. TTE 03/20/23 with LVEF down to 25-30%, G1DD, mild MR, trivial Tr, and small mobile echogenicity noted on TTE. Cardiology ordered TEE along with LHC.  Pertinent Lab Values: Creatinine, Ser  Date Value Ref Range Status  03/27/2023 1.21 (H) 0.44 - 1.00 mg/dL Final   BUN  Date Value Ref Range Status  03/27/2023 31 (H) 6 - 20 mg/dL Final   Potassium  Date Value Ref Range Status  03/27/2023 3.4 (L) 3.5 - 5.1 mmol/L Final   Sodium  Date Value Ref Range Status  03/27/2023 139 135 - 145 mmol/L Final   B Natriuretic Peptide  Date Value Ref Range Status  03/20/2023 59.0 0.0 - 100.0 pg/mL Final    Comment:    Performed at [facility], [address] [place], [place] [address]   Magnesium  Date  Value Ref Range Status  03/27/2023 2.2 1.7 - 2.4 mg/dL Final    Comment:    Performed at [facility], [address] [place], [place] [address]   Hgb A1c MFr Bld  Date Value Ref Range Status  03/20/2023 8.3 (H) 4.8 - 5.6 % Final    Comment:    (NOTE) Pre diabetes:          5.7%-6.4%  Diabetes:              >6.4%  Glycemic control for   <7.0% adults with diabetes     Vital Signs: Temp:  [98 F (36.7 C)-98.5 F (36.9 C)] 98.5 F (36.9 C) (10/09 0725) Pulse Rate:  [65-75] 67 (10/09 0725) Cardiac Rhythm: Normal sinus rhythm (10/09 0700) Resp:  [15-25] 18 (10/09 0300) BP: (123-156)/(58-115) 138/58 (10/09 0725) SpO2:  [90 %-97 %] 93 % (10/09 0725) FiO2 (%):  [21 %-28 %] 21 % (10/08 2245) Weight:  [141.8 kg (312 lb 9.8 oz)] 141.8 kg (312 lb 9.8 oz) (10/08 1234)  Intake/Output Summary (Last 24 hours) at 03/27/2023 0812 Last data filed at 03/26/2023 1630 Gross per 24 hour  Intake 480 ml  Output 1500 ml  Net -1020 ml   Current Inpatient Medications:  -Metoprolol succinate 25 mg BID -Entresto 24-26 mg BID -Furosemide 40 mg BID  Prior to admission HF Medications:  -Metoprolol succinate 50 mg daily -Entresto 24-26 mg BID -Spironolactone 25 mg daily -Jardiance 25 mg daily -  Furosemide 40 mg BID  Assessment: 1. 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 NYHA class II-III symptoms.  -BNP on admission was normal, creatinine/BUN improved after holding diuretics (was dry prior to admission on furosemide 40 mg BID in the setting of sepsis). Once euvolemic, oral diuretics were resumed. Weight documented down 2 lbs from admission yesterday, no weight today. Patient denies shortness of breath and swelling. Difficult to assess LEE given body habitus. -BP soft on admission, but now elevated in the 130s/70s.Creatinine stable after cath, can consider increasing Entresto to 49-51 mg BID, especially since patient will  not be taking Jardiance at discharge due to multiple UTIs including pyelonephritis and now bacteremia.  Plan: 1) Medication changes recommended at this time: -Consider increasing Entresto to 49-51 mg BID   2) Patient assistance: -None required  3) Education: - Patient has been educated on current HF medications and potential additions to HF medication regimen - Patient verbalizes understanding that over the next few months, these medication doses may change and more medications may be added to optimize HF regimen - Patient has been educated on basic disease state pathophysiology and goals of therapy  Medication Assistance / Insurance Benefits Check:  Does the patient have prescription insurance? Prescription Insurance: [employer] ([employer])  Type of insurance plan:   Does the patient qualify for medication assistance through manufacturers or grants? No  Outpatient Pharmacy:  Prior to admission outpatient pharmacy: [facility] Is the patient agreeable to switch to [facility]?: Yes   Please do not hesitate to reach out with questions or concerns,  Enos Fling, PharmD, BCPS Heart Failure Pharmacist Phone - [phone] 03/27/2023 8:12 AM

## 2023-03-27 NOTE — Progress Notes (Signed)
[facility] CLINIC CARDIOLOGY CONSULT NOTE       Patient ID: Julia Simmons MRN: [id number] DOB/AGE: 1961-04-13 62 y.o.  Admit date: 03/20/2023 Referring Physician Dr. Mikey College Primary Physician Dr. Maurine Minister Primary Cardiologist Dr. Darrold Junker Reason for Consultation NSTEMI  HPI: Julia Simmons is a 62 y.o. female  with a past medical history of coronary artery disease s/p DES to LAD 11/2021, ischemic cardiomyopathy, chronic HFrEF (EF 35-40% 11/2021), R MCA CVA 11/2021, hypertension, hyperlipidemia, type 2 diabetes, OSA on CPAP, morbid obesity who presented to the ED on 03/20/2023 for malaise, dysuria. Cardiology was consulted for further evaluation.   Interval history: -Patient reporting she feels well today. -Denies chest pain, shortness of breath, palpitations. On room air.  -BP and HR remain stable. Renal function at baseline.   Review of systems complete and found to be negative unless listed above    Past Medical History:  Diagnosis Date   Charcot's joint of foot    Coronary artery disease    Diabetes mellitus with neuropathy (HCC)    Diabetic retinopathy (HCC)    Hypertension    Morbid obesity with BMI of 50.0-59.9, adult (HCC)    Sleep apnea    Stroke [facility])     Past Surgical History:  Procedure Laterality Date   CATARACT EXTRACTION W/PHACO Right 07/18/2022   Procedure: CATARACT EXTRACTION PHACO AND INTRAOCULAR LENS PLACEMENT (IOC) RIGHT DIABETIC HEALON 5 VISION BLUE 33.79 2:31.3;  Surgeon: Lockie Mola, MD;  Location: [facility];  Service: Ophthalmology;  Laterality: Right;   CESAREAN SECTION     CORONARY BALLOON ANGIOPLASTY N/A 03/26/2023   Procedure: CORONARY BALLOON ANGIOPLASTY;  Surgeon: Marcina Millard, MD;  Location: [facility] INVASIVE CV LAB;  Service: Cardiovascular;  Laterality: N/A;   LEFT HEART CATH AND CORONARY ANGIOGRAPHY N/A 03/26/2023   Procedure: LEFT HEART CATH AND CORONARY ANGIOGRAPHY possible PCI and stent;  Surgeon: Marcina Millard, MD;  Location: [facility] INVASIVE CV LAB;  Service: Cardiovascular;  Laterality: N/A;   TEE WITHOUT CARDIOVERSION N/A 03/25/2023   Procedure: TRANSESOPHAGEAL ECHOCARDIOGRAM;  Surgeon: Kathryne Gin, MD;  Location: [facility] ORS;  Service: Cardiovascular;  Laterality: N/A;  11am    Medications Prior to Admission  Medication Sig Dispense Refill Last Dose   albuterol (VENTOLIN HFA) 108 (90 Base) MCG/ACT inhaler Inhale into the lungs.   03/20/2023   aspirin EC 81 MG tablet Take 81 mg by mouth daily.   03/19/2023   atorvastatin (LIPITOR) 40 MG tablet Take by mouth.   03/19/2023   clopidogrel (PLAVIX) 75 MG tablet Take 75 mg by mouth daily.   03/19/2023   furosemide (LASIX) 40 MG tablet Take 1.5 tablets (60 mg total) by mouth daily. (Patient taking differently: Take 40 mg by mouth 2 (two) times daily.) 30 tablet 1 03/19/2023   guaiFENesin-dextromethorphan (ROBITUSSIN DM) 100-10 MG/5ML syrup Take 10 mLs by mouth every 6 (six) hours as needed for cough. 118 mL 1 prn at unk   Insulin Aspart FlexPen (NOVOLOG) 100 UNIT/ML Inject 64 Units into the skin in the morning and at bedtime. Before lunch and dinner   03/19/2023   insulin glargine (LANTUS) 100 UNIT/ML injection Inject 100 Units into the skin at bedtime.   03/19/2023   metFORMIN (GLUCOPHAGE) 500 MG tablet Take 1,000 mg by mouth daily with breakfast.   03/19/2023   metFORMIN (GLUCOPHAGE) 500 MG tablet Take 1,000 mg by mouth daily with supper.   03/19/2023   metoprolol succinate (TOPROL-XL) 50 MG 24 hr tablet Take 1 tablet  VIEW COMPARISON:  Chest x-ray 03/20/2023. FINDINGS: Vague opacity projecting over the left hemithorax. Right lung appears clear. No right pleural effusion. No pneumothorax. No evidence of pulmonary edema. Heart size is normal. The patient is rotated to the left on today's  exam, resulting in distortion of the mediastinal contours and reduced diagnostic sensitivity and specificity for mediastinal pathology. IMPRESSION: 1. Extensive airspace consolidation in the left lung compatible with pneumonia better demonstrated on prior chest CT. Overall, aeration has worsened in the left lung compared to the recent chest x-ray. Electronically Signed   By: Trudie Reed M.D.   On: 03/21/2023 07:22   ECHOCARDIOGRAM COMPLETE  Result Date: 03/20/2023    ECHOCARDIOGRAM REPORT   Patient Name:   Julia Simmons Date of Exam: 03/20/2023 Medical Rec #:  [id number]       Height:       65.0 in Accession #:    [id number]      Weight:       318.0 lb Date of Birth:  [birth year]        BSA:          2.409 m Patient Age:    62 years        BP:           103/78 mmHg Patient Gender: F               HR:           96 bpm. Exam Location:  [facility] Procedure: 2D Echo, Color Doppler, Cardiac Doppler and Intracardiac            Opacification Agent Indications:     NSTEMI I21.4  History:         Patient has prior history of Echocardiogram examinations, most                  recent 06/17/2020. Risk Factors:Hypertension, Diabetes and                  Sleep Apnea.  Sonographer:     Cristela Blue Referring Phys:  [id number] Henrique Parekh Diagnosing Phys: Windell Norfolk  Sonographer Comments: Suboptimal apical window. IMPRESSIONS  1. Left ventricular ejection fraction, by estimation, is 25 to 30%. The left ventricle has severely decreased function. The left ventricle demonstrates regional wall motion abnormalities (see scoring diagram/findings for description). Left ventricular diastolic parameters are consistent with Grade I diastolic dysfunction (impaired relaxation).  2. Right ventricular systolic function was not well visualized. The right ventricular size is not well visualized.  3. The mitral valve is normal in structure. Mild mitral valve regurgitation.  4. Small mobile echogenicity on aortic valve, degenerative change vs  vegetation. Consider TEE for further evaluation as clinically indicated.. The aortic valve is calcified. There is mild calcification of the aortic valve. There is moderate thickening of the aortic valve. Aortic valve regurgitation is not visualized. No aortic stenosis is present. FINDINGS  Left Ventricle: Left ventricular ejection fraction, by estimation, is 25 to 30%. The left ventricle has severely decreased function. The left ventricle demonstrates regional wall motion abnormalities. Definity contrast agent was given IV to delineate the left ventricular endocardial borders. The left ventricular internal cavity size was normal in size. There is no left ventricular hypertrophy. Left ventricular diastolic parameters are consistent with Grade I diastolic dysfunction (impaired relaxation).  LV Wall Scoring: The mid and distal anterior septum, entire apex, and mid anterolateral segment are akinetic. The basal anteroseptal segment, mid inferolateral segment, mid inferoseptal segment, mid  VIEW COMPARISON:  Chest x-ray 03/20/2023. FINDINGS: Vague opacity projecting over the left hemithorax. Right lung appears clear. No right pleural effusion. No pneumothorax. No evidence of pulmonary edema. Heart size is normal. The patient is rotated to the left on today's  exam, resulting in distortion of the mediastinal contours and reduced diagnostic sensitivity and specificity for mediastinal pathology. IMPRESSION: 1. Extensive airspace consolidation in the left lung compatible with pneumonia better demonstrated on prior chest CT. Overall, aeration has worsened in the left lung compared to the recent chest x-ray. Electronically Signed   By: Trudie Reed M.D.   On: 03/21/2023 07:22   ECHOCARDIOGRAM COMPLETE  Result Date: 03/20/2023    ECHOCARDIOGRAM REPORT   Patient Name:   Julia Simmons Date of Exam: 03/20/2023 Medical Rec #:  [id number]       Height:       65.0 in Accession #:    [id number]      Weight:       318.0 lb Date of Birth:  [birth year]        BSA:          2.409 m Patient Age:    62 years        BP:           103/78 mmHg Patient Gender: F               HR:           96 bpm. Exam Location:  [facility] Procedure: 2D Echo, Color Doppler, Cardiac Doppler and Intracardiac            Opacification Agent Indications:     NSTEMI I21.4  History:         Patient has prior history of Echocardiogram examinations, most                  recent 06/17/2020. Risk Factors:Hypertension, Diabetes and                  Sleep Apnea.  Sonographer:     Cristela Blue Referring Phys:  [id number] Henrique Parekh Diagnosing Phys: Windell Norfolk  Sonographer Comments: Suboptimal apical window. IMPRESSIONS  1. Left ventricular ejection fraction, by estimation, is 25 to 30%. The left ventricle has severely decreased function. The left ventricle demonstrates regional wall motion abnormalities (see scoring diagram/findings for description). Left ventricular diastolic parameters are consistent with Grade I diastolic dysfunction (impaired relaxation).  2. Right ventricular systolic function was not well visualized. The right ventricular size is not well visualized.  3. The mitral valve is normal in structure. Mild mitral valve regurgitation.  4. Small mobile echogenicity on aortic valve, degenerative change vs  vegetation. Consider TEE for further evaluation as clinically indicated.. The aortic valve is calcified. There is mild calcification of the aortic valve. There is moderate thickening of the aortic valve. Aortic valve regurgitation is not visualized. No aortic stenosis is present. FINDINGS  Left Ventricle: Left ventricular ejection fraction, by estimation, is 25 to 30%. The left ventricle has severely decreased function. The left ventricle demonstrates regional wall motion abnormalities. Definity contrast agent was given IV to delineate the left ventricular endocardial borders. The left ventricular internal cavity size was normal in size. There is no left ventricular hypertrophy. Left ventricular diastolic parameters are consistent with Grade I diastolic dysfunction (impaired relaxation).  LV Wall Scoring: The mid and distal anterior septum, entire apex, and mid anterolateral segment are akinetic. The basal anteroseptal segment, mid inferolateral segment, mid inferoseptal segment, mid  VIEW COMPARISON:  Chest x-ray 03/20/2023. FINDINGS: Vague opacity projecting over the left hemithorax. Right lung appears clear. No right pleural effusion. No pneumothorax. No evidence of pulmonary edema. Heart size is normal. The patient is rotated to the left on today's  exam, resulting in distortion of the mediastinal contours and reduced diagnostic sensitivity and specificity for mediastinal pathology. IMPRESSION: 1. Extensive airspace consolidation in the left lung compatible with pneumonia better demonstrated on prior chest CT. Overall, aeration has worsened in the left lung compared to the recent chest x-ray. Electronically Signed   By: Trudie Reed M.D.   On: 03/21/2023 07:22   ECHOCARDIOGRAM COMPLETE  Result Date: 03/20/2023    ECHOCARDIOGRAM REPORT   Patient Name:   Julia Simmons Date of Exam: 03/20/2023 Medical Rec #:  [id number]       Height:       65.0 in Accession #:    [id number]      Weight:       318.0 lb Date of Birth:  [birth year]        BSA:          2.409 m Patient Age:    62 years        BP:           103/78 mmHg Patient Gender: F               HR:           96 bpm. Exam Location:  [facility] Procedure: 2D Echo, Color Doppler, Cardiac Doppler and Intracardiac            Opacification Agent Indications:     NSTEMI I21.4  History:         Patient has prior history of Echocardiogram examinations, most                  recent 06/17/2020. Risk Factors:Hypertension, Diabetes and                  Sleep Apnea.  Sonographer:     Cristela Blue Referring Phys:  [id number] Henrique Parekh Diagnosing Phys: Windell Norfolk  Sonographer Comments: Suboptimal apical window. IMPRESSIONS  1. Left ventricular ejection fraction, by estimation, is 25 to 30%. The left ventricle has severely decreased function. The left ventricle demonstrates regional wall motion abnormalities (see scoring diagram/findings for description). Left ventricular diastolic parameters are consistent with Grade I diastolic dysfunction (impaired relaxation).  2. Right ventricular systolic function was not well visualized. The right ventricular size is not well visualized.  3. The mitral valve is normal in structure. Mild mitral valve regurgitation.  4. Small mobile echogenicity on aortic valve, degenerative change vs  vegetation. Consider TEE for further evaluation as clinically indicated.. The aortic valve is calcified. There is mild calcification of the aortic valve. There is moderate thickening of the aortic valve. Aortic valve regurgitation is not visualized. No aortic stenosis is present. FINDINGS  Left Ventricle: Left ventricular ejection fraction, by estimation, is 25 to 30%. The left ventricle has severely decreased function. The left ventricle demonstrates regional wall motion abnormalities. Definity contrast agent was given IV to delineate the left ventricular endocardial borders. The left ventricular internal cavity size was normal in size. There is no left ventricular hypertrophy. Left ventricular diastolic parameters are consistent with Grade I diastolic dysfunction (impaired relaxation).  LV Wall Scoring: The mid and distal anterior septum, entire apex, and mid anterolateral segment are akinetic. The basal anteroseptal segment, mid inferolateral segment, mid inferoseptal segment, mid  Weight:       323.4 lb Date of Birth:  12/29/1960        BSA:          2.426 m Patient Age:    62 years        BP:           129/75 mmHg Patient Gender: F               HR:           64 bpm. Exam Location:  [facility] Procedure: Transesophageal Echo, Cardiac Doppler and Color Doppler Indications:     SBE (subacute bacterial endocarditis) I33.0  History:         Patient has prior history of Echocardiogram examinations, most                  recent 03/20/2023. Risk Factors:Hypertension and Diabetes.  Sonographer:     Cristela Blue Referring Phys:  [id number] DWAYNE D CALLWOOD Diagnosing Phys: Windell Norfolk PROCEDURE: The transesophogeal probe was passed without difficulty through the esophogus of the patient. Local oropharyngeal anesthetic was provided with Cetacaine. Sedation performed by different physician. Image quality was excellent. The patient developed no complications during the procedure. Sedation provided by anesthesiologist.  IMPRESSIONS  1. Left ventricular ejection fraction, by estimation, is 25 to 30%. The left ventricle has severely decreased function.  2. Right ventricular systolic function is normal. The right ventricular size is normal.  3. No left atrial/left atrial appendage thrombus was detected.  4. The mitral valve is normal in structure. Mild mitral valve regurgitation.  5. No valvular vegetation. The aortic valve is tricuspid. There is mild calcification of the aortic valve. There is mild thickening of the aortic valve. Aortic valve regurgitation is not visualized. FINDINGS  Left Ventricle: Left ventricular ejection fraction, by estimation, is 25 to 30%. The left ventricle has severely decreased function. The left ventricular internal cavity size was normal in size. Right Ventricle: The right ventricular size is normal. No increase in right ventricular wall  thickness. Right ventricular systolic function is normal. Left Atrium: Left atrial size was normal in size. No left atrial/left atrial appendage thrombus was detected. Right Atrium: Right atrial size was normal in size. Pericardium: There is no evidence of pericardial effusion. Mitral Valve: The mitral valve is normal in structure. Mild mitral valve regurgitation. Tricuspid Valve: The tricuspid valve is normal in structure. Tricuspid valve regurgitation is trivial. Aortic Valve: No valvular vegetation. The aortic valve is tricuspid. There is mild calcification of the aortic valve. There is mild thickening of the aortic valve. Aortic valve regurgitation is not visualized. Pulmonic Valve: The pulmonic valve was normal in structure. Pulmonic valve regurgitation is trivial. Aorta: The aortic root is normal in size and structure. IAS/Shunts: The interatrial septum was not well visualized. Windell Norfolk Electronically signed by Windell Norfolk Signature Date/Time: 03/25/2023/3:52:05 PM    Final    DG Chest 2 View  Result Date: 03/22/2023 CLINICAL DATA:  Pneumonia EXAM: CHEST - 2 VIEW COMPARISON:  03/21/2023 FINDINGS: Cardiomegaly. Mild, diffuse interstitial opacity similar to prior examination. No focal airspace opacity. The visualized skeletal structures are unremarkable. IMPRESSION: Cardiomegaly with mild, diffuse interstitial opacity similar to prior examination, consistent with edema or atypical/viral infection. No focal airspace opacity. Electronically Signed   By: Jearld Lesch M.D.   On: 03/22/2023 12:47   DG Chest 1 View  Result Date: 03/21/2023 CLINICAL DATA:  62 year old female with history of congestive heart failure. EXAM: CHEST  1  VIEW COMPARISON:  Chest x-ray 03/20/2023. FINDINGS: Vague opacity projecting over the left hemithorax. Right lung appears clear. No right pleural effusion. No pneumothorax. No evidence of pulmonary edema. Heart size is normal. The patient is rotated to the left on today's  exam, resulting in distortion of the mediastinal contours and reduced diagnostic sensitivity and specificity for mediastinal pathology. IMPRESSION: 1. Extensive airspace consolidation in the left lung compatible with pneumonia better demonstrated on prior chest CT. Overall, aeration has worsened in the left lung compared to the recent chest x-ray. Electronically Signed   By: Trudie Reed M.D.   On: 03/21/2023 07:22   ECHOCARDIOGRAM COMPLETE  Result Date: 03/20/2023    ECHOCARDIOGRAM REPORT   Patient Name:   Julia Simmons Date of Exam: 03/20/2023 Medical Rec #:  [id number]       Height:       65.0 in Accession #:    [id number]      Weight:       318.0 lb Date of Birth:  [birth year]        BSA:          2.409 m Patient Age:    62 years        BP:           103/78 mmHg Patient Gender: F               HR:           96 bpm. Exam Location:  [facility] Procedure: 2D Echo, Color Doppler, Cardiac Doppler and Intracardiac            Opacification Agent Indications:     NSTEMI I21.4  History:         Patient has prior history of Echocardiogram examinations, most                  recent 06/17/2020. Risk Factors:Hypertension, Diabetes and                  Sleep Apnea.  Sonographer:     Cristela Blue Referring Phys:  [id number] Henrique Parekh Diagnosing Phys: Windell Norfolk  Sonographer Comments: Suboptimal apical window. IMPRESSIONS  1. Left ventricular ejection fraction, by estimation, is 25 to 30%. The left ventricle has severely decreased function. The left ventricle demonstrates regional wall motion abnormalities (see scoring diagram/findings for description). Left ventricular diastolic parameters are consistent with Grade I diastolic dysfunction (impaired relaxation).  2. Right ventricular systolic function was not well visualized. The right ventricular size is not well visualized.  3. The mitral valve is normal in structure. Mild mitral valve regurgitation.  4. Small mobile echogenicity on aortic valve, degenerative change vs  vegetation. Consider TEE for further evaluation as clinically indicated.. The aortic valve is calcified. There is mild calcification of the aortic valve. There is moderate thickening of the aortic valve. Aortic valve regurgitation is not visualized. No aortic stenosis is present. FINDINGS  Left Ventricle: Left ventricular ejection fraction, by estimation, is 25 to 30%. The left ventricle has severely decreased function. The left ventricle demonstrates regional wall motion abnormalities. Definity contrast agent was given IV to delineate the left ventricular endocardial borders. The left ventricular internal cavity size was normal in size. There is no left ventricular hypertrophy. Left ventricular diastolic parameters are consistent with Grade I diastolic dysfunction (impaired relaxation).  LV Wall Scoring: The mid and distal anterior septum, entire apex, and mid anterolateral segment are akinetic. The basal anteroseptal segment, mid inferolateral segment, mid inferoseptal segment, mid  VIEW COMPARISON:  Chest x-ray 03/20/2023. FINDINGS: Vague opacity projecting over the left hemithorax. Right lung appears clear. No right pleural effusion. No pneumothorax. No evidence of pulmonary edema. Heart size is normal. The patient is rotated to the left on today's  exam, resulting in distortion of the mediastinal contours and reduced diagnostic sensitivity and specificity for mediastinal pathology. IMPRESSION: 1. Extensive airspace consolidation in the left lung compatible with pneumonia better demonstrated on prior chest CT. Overall, aeration has worsened in the left lung compared to the recent chest x-ray. Electronically Signed   By: Trudie Reed M.D.   On: 03/21/2023 07:22   ECHOCARDIOGRAM COMPLETE  Result Date: 03/20/2023    ECHOCARDIOGRAM REPORT   Patient Name:   Julia Simmons Date of Exam: 03/20/2023 Medical Rec #:  [id number]       Height:       65.0 in Accession #:    [id number]      Weight:       318.0 lb Date of Birth:  [birth year]        BSA:          2.409 m Patient Age:    62 years        BP:           103/78 mmHg Patient Gender: F               HR:           96 bpm. Exam Location:  [facility] Procedure: 2D Echo, Color Doppler, Cardiac Doppler and Intracardiac            Opacification Agent Indications:     NSTEMI I21.4  History:         Patient has prior history of Echocardiogram examinations, most                  recent 06/17/2020. Risk Factors:Hypertension, Diabetes and                  Sleep Apnea.  Sonographer:     Cristela Blue Referring Phys:  [id number] Henrique Parekh Diagnosing Phys: Windell Norfolk  Sonographer Comments: Suboptimal apical window. IMPRESSIONS  1. Left ventricular ejection fraction, by estimation, is 25 to 30%. The left ventricle has severely decreased function. The left ventricle demonstrates regional wall motion abnormalities (see scoring diagram/findings for description). Left ventricular diastolic parameters are consistent with Grade I diastolic dysfunction (impaired relaxation).  2. Right ventricular systolic function was not well visualized. The right ventricular size is not well visualized.  3. The mitral valve is normal in structure. Mild mitral valve regurgitation.  4. Small mobile echogenicity on aortic valve, degenerative change vs  vegetation. Consider TEE for further evaluation as clinically indicated.. The aortic valve is calcified. There is mild calcification of the aortic valve. There is moderate thickening of the aortic valve. Aortic valve regurgitation is not visualized. No aortic stenosis is present. FINDINGS  Left Ventricle: Left ventricular ejection fraction, by estimation, is 25 to 30%. The left ventricle has severely decreased function. The left ventricle demonstrates regional wall motion abnormalities. Definity contrast agent was given IV to delineate the left ventricular endocardial borders. The left ventricular internal cavity size was normal in size. There is no left ventricular hypertrophy. Left ventricular diastolic parameters are consistent with Grade I diastolic dysfunction (impaired relaxation).  LV Wall Scoring: The mid and distal anterior septum, entire apex, and mid anterolateral segment are akinetic. The basal anteroseptal segment, mid inferolateral segment, mid inferoseptal segment, mid  Weight:       323.4 lb Date of Birth:  12/29/1960        BSA:          2.426 m Patient Age:    62 years        BP:           129/75 mmHg Patient Gender: F               HR:           64 bpm. Exam Location:  [facility] Procedure: Transesophageal Echo, Cardiac Doppler and Color Doppler Indications:     SBE (subacute bacterial endocarditis) I33.0  History:         Patient has prior history of Echocardiogram examinations, most                  recent 03/20/2023. Risk Factors:Hypertension and Diabetes.  Sonographer:     Cristela Blue Referring Phys:  [id number] DWAYNE D CALLWOOD Diagnosing Phys: Windell Norfolk PROCEDURE: The transesophogeal probe was passed without difficulty through the esophogus of the patient. Local oropharyngeal anesthetic was provided with Cetacaine. Sedation performed by different physician. Image quality was excellent. The patient developed no complications during the procedure. Sedation provided by anesthesiologist.  IMPRESSIONS  1. Left ventricular ejection fraction, by estimation, is 25 to 30%. The left ventricle has severely decreased function.  2. Right ventricular systolic function is normal. The right ventricular size is normal.  3. No left atrial/left atrial appendage thrombus was detected.  4. The mitral valve is normal in structure. Mild mitral valve regurgitation.  5. No valvular vegetation. The aortic valve is tricuspid. There is mild calcification of the aortic valve. There is mild thickening of the aortic valve. Aortic valve regurgitation is not visualized. FINDINGS  Left Ventricle: Left ventricular ejection fraction, by estimation, is 25 to 30%. The left ventricle has severely decreased function. The left ventricular internal cavity size was normal in size. Right Ventricle: The right ventricular size is normal. No increase in right ventricular wall  thickness. Right ventricular systolic function is normal. Left Atrium: Left atrial size was normal in size. No left atrial/left atrial appendage thrombus was detected. Right Atrium: Right atrial size was normal in size. Pericardium: There is no evidence of pericardial effusion. Mitral Valve: The mitral valve is normal in structure. Mild mitral valve regurgitation. Tricuspid Valve: The tricuspid valve is normal in structure. Tricuspid valve regurgitation is trivial. Aortic Valve: No valvular vegetation. The aortic valve is tricuspid. There is mild calcification of the aortic valve. There is mild thickening of the aortic valve. Aortic valve regurgitation is not visualized. Pulmonic Valve: The pulmonic valve was normal in structure. Pulmonic valve regurgitation is trivial. Aorta: The aortic root is normal in size and structure. IAS/Shunts: The interatrial septum was not well visualized. Windell Norfolk Electronically signed by Windell Norfolk Signature Date/Time: 03/25/2023/3:52:05 PM    Final    DG Chest 2 View  Result Date: 03/22/2023 CLINICAL DATA:  Pneumonia EXAM: CHEST - 2 VIEW COMPARISON:  03/21/2023 FINDINGS: Cardiomegaly. Mild, diffuse interstitial opacity similar to prior examination. No focal airspace opacity. The visualized skeletal structures are unremarkable. IMPRESSION: Cardiomegaly with mild, diffuse interstitial opacity similar to prior examination, consistent with edema or atypical/viral infection. No focal airspace opacity. Electronically Signed   By: Jearld Lesch M.D.   On: 03/22/2023 12:47   DG Chest 1 View  Result Date: 03/21/2023 CLINICAL DATA:  62 year old female with history of congestive heart failure. EXAM: CHEST  1  VIEW COMPARISON:  Chest x-ray 03/20/2023. FINDINGS: Vague opacity projecting over the left hemithorax. Right lung appears clear. No right pleural effusion. No pneumothorax. No evidence of pulmonary edema. Heart size is normal. The patient is rotated to the left on today's  exam, resulting in distortion of the mediastinal contours and reduced diagnostic sensitivity and specificity for mediastinal pathology. IMPRESSION: 1. Extensive airspace consolidation in the left lung compatible with pneumonia better demonstrated on prior chest CT. Overall, aeration has worsened in the left lung compared to the recent chest x-ray. Electronically Signed   By: Trudie Reed M.D.   On: 03/21/2023 07:22   ECHOCARDIOGRAM COMPLETE  Result Date: 03/20/2023    ECHOCARDIOGRAM REPORT   Patient Name:   Julia Simmons Date of Exam: 03/20/2023 Medical Rec #:  [id number]       Height:       65.0 in Accession #:    [id number]      Weight:       318.0 lb Date of Birth:  [birth year]        BSA:          2.409 m Patient Age:    62 years        BP:           103/78 mmHg Patient Gender: F               HR:           96 bpm. Exam Location:  [facility] Procedure: 2D Echo, Color Doppler, Cardiac Doppler and Intracardiac            Opacification Agent Indications:     NSTEMI I21.4  History:         Patient has prior history of Echocardiogram examinations, most                  recent 06/17/2020. Risk Factors:Hypertension, Diabetes and                  Sleep Apnea.  Sonographer:     Cristela Blue Referring Phys:  [id number] Henrique Parekh Diagnosing Phys: Windell Norfolk  Sonographer Comments: Suboptimal apical window. IMPRESSIONS  1. Left ventricular ejection fraction, by estimation, is 25 to 30%. The left ventricle has severely decreased function. The left ventricle demonstrates regional wall motion abnormalities (see scoring diagram/findings for description). Left ventricular diastolic parameters are consistent with Grade I diastolic dysfunction (impaired relaxation).  2. Right ventricular systolic function was not well visualized. The right ventricular size is not well visualized.  3. The mitral valve is normal in structure. Mild mitral valve regurgitation.  4. Small mobile echogenicity on aortic valve, degenerative change vs  vegetation. Consider TEE for further evaluation as clinically indicated.. The aortic valve is calcified. There is mild calcification of the aortic valve. There is moderate thickening of the aortic valve. Aortic valve regurgitation is not visualized. No aortic stenosis is present. FINDINGS  Left Ventricle: Left ventricular ejection fraction, by estimation, is 25 to 30%. The left ventricle has severely decreased function. The left ventricle demonstrates regional wall motion abnormalities. Definity contrast agent was given IV to delineate the left ventricular endocardial borders. The left ventricular internal cavity size was normal in size. There is no left ventricular hypertrophy. Left ventricular diastolic parameters are consistent with Grade I diastolic dysfunction (impaired relaxation).  LV Wall Scoring: The mid and distal anterior septum, entire apex, and mid anterolateral segment are akinetic. The basal anteroseptal segment, mid inferolateral segment, mid inferoseptal segment, mid  VIEW COMPARISON:  Chest x-ray 03/20/2023. FINDINGS: Vague opacity projecting over the left hemithorax. Right lung appears clear. No right pleural effusion. No pneumothorax. No evidence of pulmonary edema. Heart size is normal. The patient is rotated to the left on today's  exam, resulting in distortion of the mediastinal contours and reduced diagnostic sensitivity and specificity for mediastinal pathology. IMPRESSION: 1. Extensive airspace consolidation in the left lung compatible with pneumonia better demonstrated on prior chest CT. Overall, aeration has worsened in the left lung compared to the recent chest x-ray. Electronically Signed   By: Trudie Reed M.D.   On: 03/21/2023 07:22   ECHOCARDIOGRAM COMPLETE  Result Date: 03/20/2023    ECHOCARDIOGRAM REPORT   Patient Name:   Julia Simmons Date of Exam: 03/20/2023 Medical Rec #:  [id number]       Height:       65.0 in Accession #:    [id number]      Weight:       318.0 lb Date of Birth:  [birth year]        BSA:          2.409 m Patient Age:    62 years        BP:           103/78 mmHg Patient Gender: F               HR:           96 bpm. Exam Location:  [facility] Procedure: 2D Echo, Color Doppler, Cardiac Doppler and Intracardiac            Opacification Agent Indications:     NSTEMI I21.4  History:         Patient has prior history of Echocardiogram examinations, most                  recent 06/17/2020. Risk Factors:Hypertension, Diabetes and                  Sleep Apnea.  Sonographer:     Cristela Blue Referring Phys:  [id number] Henrique Parekh Diagnosing Phys: Windell Norfolk  Sonographer Comments: Suboptimal apical window. IMPRESSIONS  1. Left ventricular ejection fraction, by estimation, is 25 to 30%. The left ventricle has severely decreased function. The left ventricle demonstrates regional wall motion abnormalities (see scoring diagram/findings for description). Left ventricular diastolic parameters are consistent with Grade I diastolic dysfunction (impaired relaxation).  2. Right ventricular systolic function was not well visualized. The right ventricular size is not well visualized.  3. The mitral valve is normal in structure. Mild mitral valve regurgitation.  4. Small mobile echogenicity on aortic valve, degenerative change vs  vegetation. Consider TEE for further evaluation as clinically indicated.. The aortic valve is calcified. There is mild calcification of the aortic valve. There is moderate thickening of the aortic valve. Aortic valve regurgitation is not visualized. No aortic stenosis is present. FINDINGS  Left Ventricle: Left ventricular ejection fraction, by estimation, is 25 to 30%. The left ventricle has severely decreased function. The left ventricle demonstrates regional wall motion abnormalities. Definity contrast agent was given IV to delineate the left ventricular endocardial borders. The left ventricular internal cavity size was normal in size. There is no left ventricular hypertrophy. Left ventricular diastolic parameters are consistent with Grade I diastolic dysfunction (impaired relaxation).  LV Wall Scoring: The mid and distal anterior septum, entire apex, and mid anterolateral segment are akinetic. The basal anteroseptal segment, mid inferolateral segment, mid inferoseptal segment, mid

## 2023-03-27 NOTE — Progress Notes (Signed)
PHARMACY CONSULT NOTE FOR:  OUTPATIENT  PARENTERAL ANTIBIOTIC THERAPY (OPAT)  Indication: E coli bacteremia Regimen: Ceftriaxone 2g IV q24h End date: 03/30/23  Labs - Once weekly:  CBC/D and CMP Remove PICC/midline at completion of antibiotic   IV antibiotic discharge orders are pended. To discharging provider:  please sign these orders via discharge navigator,  Select New Orders & click on the button choice - Manage This Unsigned Work.     Thank you for allowing pharmacy to be a part of this patient's care.  Juliette Alcide, PharmD, BCPS, BCIDP Work Cell: [phone] 03/27/2023 7:34 AM

## 2023-03-27 NOTE — Consult Note (Signed)
[facility] Customer service manager [facility]) Accountable Care Organization (ACO) [facility] Liaison Note  03/27/2023  Julia Simmons September 26, 1960 [id number]  Location: [facility] RN Hospital Liaison met patient at bedside at [facility].  Insurance: [employer]   Julia Simmons is a 62 y.o. female who is a Primary Care Patient of Patrice Paradise, MD. The patient was screened for  day readmission hospitalization with noted medium risk score for unplanned readmission risk with 1 IP in 6 months.  The patient was assessed for potential [facility]) Care Management service needs for post hospital transition for care coordination. Review of patient's electronic medical record reveals patient was admitted for Sepsis. Liaison spoke with pt and educated on community care management services. Offered a post hospital prevention readmission follow up call from a nurse care coordinator to assist further with managing her care (pt receptive).    Plan: [facility] Liaison will continue to follow progress and disposition to asess for post hospital community care coordination/management needs.  Referral request for community care coordination: Will make a referral for care management services with a nurse care coordinator for post hospital prevention readmission follow up call.   The [facility] Care Management/Population Health does not replace or interfere with any arrangements made by the Inpatient Transition of Care team.   For questions contact:   Elliot Cousin, RN, [facility] Liaison [place]   Population Health Office Hours MTWF  8:00 am-6:00 pm [phone] mobile [phone] [Office toll free line] Office Hours are M-F 8:30 - 5 pm [email or web address]

## 2023-03-27 NOTE — Progress Notes (Signed)
A midline has been ordered for this patient. The [facility] RN has reviewed the patient's medical record including any arm restrictions, current creatinine clearance, length IV therapy is needed, and infusions needed/ordered to determine if a midline is the appropriate line for this individual patient. If there are contraindications, the physician and primary RN has been contacted by [facility] RN for further discussion. °Midline Education: °a midline is a long peripheral IV placed in the upper arm with the tip located at or near the axilla and distal to the shoulder °should not be used as a CLABSI preventative measure   °it has one lumen only °it can remain in place for up to 29 days  °Safe for Vancomycin infusion LESS THAN 6 days °Is safe for power injection if good blood return can be obtained and line flushes easily °it CANNOT be used for continuous infusion of vesicants. Including TPN and chemotherapy °Should NOT be placed for sole intent of obtaining labs as there is no guarantee blood can be successfully drawn from line  °contraindicated in patients with thrombosis, hypercoagulability, decreased venous flow to the extremities, ESRD (without a nephrologist's approval), small vessels, allergy to polyurethane, or known/suspected presence of a device-related infection, bacteremia, or septicemia  °

## 2023-03-27 NOTE — TOC Transition Note (Signed)
Transition of Care [facility]) - CM/SW Discharge Note   Patient Details  Name: Julia Simmons MRN: [id number] Date of Birth: 29-Mar-1961  Transition of [facility]) CM/SW Contact:  Truddie Hidden, RN Phone Number: 03/27/2023, 3:08 PM   Clinical Narrative:    Patient discharging home. Son will transport. HH RN arranged via [facility].  TOC signing off.          Patient Goals and CMS Choice      Discharge Placement                         Discharge Plan and Services Additional resources added to the After Visit Summary for                                       Social Determinants of Health (SDOH) Interventions SDOH Screenings   Food Insecurity: No Food Insecurity (03/20/2023)  Housing: Low Risk  (03/20/2023)  Transportation Needs: No Transportation Needs (03/20/2023)  Utilities: Not At Risk (03/20/2023)  Financial Resource Strain: Low Risk  (02/25/2023)   [facility]  Social Connections: Unknown (12/03/2021)   Received from [facility], [facility]  Stress: Patient Declined (12/03/2021)   Received from [facility], [facility]  Tobacco Use: Low Risk  (03/25/2023)     Readmission Risk Interventions     No data to display

## 2023-03-27 NOTE — Progress Notes (Signed)
Patient is ready for discharge. I have removed PIV's from Left Upper Arm and Left Forearm without complication. Patient will leave with Right Upper Arm Midline to complete IV Therapy. The Home Health Nurse has completed teaching with patient regarding IV therapy and Midline use care. I have reviewed discharge paperwork with patient and answered questions regarding discharge including whether or not patient should continue her use of 60 units of Soliquia each morning in addition to the insulins included on her discharge paperwork. After checking with Dr. Ophelia Charter, Dr. Ophelia Charter does not want patient to continue to take her Soliquia 60 units each morning with the medications included on her discharge paperwork. I have given this information to the patient. The patient will be transported home by her son who is here with her.

## 2023-03-27 NOTE — Progress Notes (Signed)
PULMONOLOGY         Date: 03/27/2023,   MRN# [id number] Julia Simmons 1960-10-25     AdmissionWeight: (!) 144.2 kg                 CurrentWeight: (!) 141.8 kg  Referring provider: Dr Chipper Herb   CHIEF COMPLAINT:   Multifocal pneumonia with E.coli bacteremia   HISTORY OF PRESENT ILLNESS   This is 62 year old morbidly obese female with a BMI of over 50 who has diabetes with chronic neuropathy, retinopathy and Charcot's foot, severe coronary artery disease status post 2 stents and reports having previous CVA and congestive heart failure, also background history of sleep apnea who came in with shortness of breath cough and chills x 2 days.  She was found to have multifocal pneumonia worse on the left on CT chest imaging with PE protocol.  She did have microbiology including blood culture showing E. coli bacteremia.  Patient is on room air during my evaluation.  She does report acid reflux but denies having aspiration events and denies vomiting.  03/27/23- patient stable from pulmonary perspective.  PCCM signing off and can follow up with patient on outpatient basis.  She's cleared for dc home from pulmonary standpoint.  She met with RT and had resp therapy feels at baseline now.   PAST MEDICAL HISTORY   Past Medical History:  Diagnosis Date   Charcot's joint of foot    Coronary artery disease    Diabetes mellitus with neuropathy (HCC)    Diabetic retinopathy (HCC)    Hypertension    Morbid obesity with BMI of 50.0-59.9, adult (HCC)    Sleep apnea    Stroke [facility])      SURGICAL HISTORY   Past Surgical History:  Procedure Laterality Date   CATARACT EXTRACTION W/PHACO Right 07/18/2022   Procedure: CATARACT EXTRACTION PHACO AND INTRAOCULAR LENS PLACEMENT (IOC) RIGHT DIABETIC HEALON 5 VISION BLUE 33.79 2:31.3;  Surgeon: Lockie Mola, MD;  Location: [facility];  Service: Ophthalmology;  Laterality: Right;   CESAREAN SECTION     CORONARY BALLOON ANGIOPLASTY  N/A 03/26/2023   Procedure: CORONARY BALLOON ANGIOPLASTY;  Surgeon: Marcina Millard, MD;  Location: [facility] INVASIVE CV LAB;  Service: Cardiovascular;  Laterality: N/A;   LEFT HEART CATH AND CORONARY ANGIOGRAPHY N/A 03/26/2023   Procedure: LEFT HEART CATH AND CORONARY ANGIOGRAPHY possible PCI and stent;  Surgeon: Marcina Millard, MD;  Location: [facility] INVASIVE CV LAB;  Service: Cardiovascular;  Laterality: N/A;   TEE WITHOUT CARDIOVERSION N/A 03/25/2023   Procedure: TRANSESOPHAGEAL ECHOCARDIOGRAM;  Surgeon: Kathryne Gin, MD;  Location: [facility] ORS;  Service: Cardiovascular;  Laterality: N/A;  11am     FAMILY HISTORY   Family History  Problem Relation Age of Onset   Breast cancer Neg Hx      SOCIAL HISTORY   Social History   Tobacco Use   Smoking status: Never   Smokeless tobacco: Never  Vaping Use   Vaping status: Never Used  Substance Use Topics   Alcohol use: Not Currently   Drug use: Never     MEDICATIONS    Home Medication:    Current Medication:  Current Facility-Administered Medications:    0.9 %  sodium chloride infusion, 250 mL, Intravenous, PRN, Paraschos, Alexander, MD   acetaminophen (TYLENOL) tablet 650 mg, 650 mg, Oral, Q6H PRN, Mikey College T, MD, 650 mg at 03/21/23 0039   acetaminophen (TYLENOL) tablet 650 mg, 650 mg, Oral, Q4H PRN, Marcina Millard, MD  albuterol (PROVENTIL) (2.5 MG/3ML) 0.083% nebulizer solution 3 mL, 3 mL, Nebulization, Q4H PRN, Emeline General, MD   aspirin EC tablet 81 mg, 81 mg, Oral, Daily, Lowella Bandy, RPH   atorvastatin (LIPITOR) tablet 80 mg, 80 mg, Oral, Daily, Hudson, Caralyn, PA-C   bacitracin ointment, , Topical, BID, Marrion Coy, MD, Given at 03/26/23 2144   budesonide (PULMICORT) nebulizer solution 0.5 mg, 0.5 mg, Nebulization, Q12H, Marrion Coy, MD, 0.5 mg at 03/27/23 0731   cefTRIAXone (ROCEPHIN) 2 g in sodium chloride 0.9 % 100 mL IVPB, 2 g, Intravenous, Q24H, Zeigler, Burtis Junes, RPH, Last Rate: 200 mL/hr at  03/26/23 1132, 2 g at 03/26/23 1132   clopidogrel (PLAVIX) tablet 75 mg, 75 mg, Oral, Daily, Mikey College T, MD, 75 mg at 03/26/23 1125   enoxaparin (LOVENOX) injection 70 mg, 0.5 mg/kg, Subcutaneous, Q24H, Marrion Coy, MD, 70 mg at 03/26/23 2144   furosemide (LASIX) tablet 40 mg, 40 mg, Oral, BID, Hudson, Caralyn, PA-C   guaiFENesin-dextromethorphan (ROBITUSSIN DM) 100-10 MG/5ML syrup 10 mL, 10 mL, Oral, Q6H PRN, Mikey College T, MD   insulin aspart (novoLOG) injection 0-15 Units, 0-15 Units, Subcutaneous, TID WC, Marrion Coy, MD, 5 Units at 03/26/23 1805   insulin aspart (novoLOG) injection 0-5 Units, 0-5 Units, Subcutaneous, QHS, Marrion Coy, MD, 2 Units at 03/26/23 2144   insulin aspart (novoLOG) injection 10 Units, 10 Units, Subcutaneous, TID WC, Marrion Coy, MD, 10 Units at 03/26/23 1806   insulin glargine-yfgn (SEMGLEE) injection 100 Units, 100 Units, Subcutaneous, Daily, Marrion Coy, MD, 100 Units at 03/26/23 1201   ipratropium-albuterol (DUONEB) 0.5-2.5 (3) MG/3ML nebulizer solution 3 mL, 3 mL, Nebulization, BID, Marrion Coy, MD, 3 mL at 03/27/23 0731   linagliptin (TRADJENTA) tablet 5 mg, 5 mg, Oral, Daily, Mikey College T, MD, 5 mg at 03/26/23 1125   metoprolol succinate (TOPROL-XL) 24 hr tablet 25 mg, 25 mg, Oral, BID, Hudson, Caralyn, PA-C, 25 mg at 03/26/23 2143   ondansetron (ZOFRAN) injection 4 mg, 4 mg, Intravenous, Q6H PRN, Mikey College T, MD, 4 mg at 03/21/23 1737   ondansetron (ZOFRAN) injection 4 mg, 4 mg, Intravenous, Q6H PRN, Paraschos, Alexander, MD   oxybutynin (DITROPAN-XL) 24 hr tablet 10 mg, 10 mg, Oral, Daily, Chipper Herb, Ping T, MD, 10 mg at 03/26/23 1125   sacubitril-valsartan (ENTRESTO) 49-51 mg per tablet, 1 tablet, Oral, BID, Hudson, Caralyn, PA-C   sodium chloride flush (NS) 0.9 % injection 3 mL, 3 mL, Intravenous, Q12H, Callwood, Dwayne D, MD, 3 mL at 03/26/23 2145   sodium chloride flush (NS) 0.9 % injection 3 mL, 3 mL, Intravenous, Q12H, Paraschos, Alexander, MD, 3  mL at 03/27/23 0112   sodium chloride flush (NS) 0.9 % injection 3 mL, 3 mL, Intravenous, PRN, Paraschos, Alexander, MD   spironolactone (ALDACTONE) tablet 25 mg, 25 mg, Oral, Daily, Hudson, Caralyn, PA-C, 25 mg at 03/26/23 1125    ALLERGIES   Hydrochlorothiazide and Sulfa antibiotics     REVIEW OF SYSTEMS    Review of Systems:  Gen:  Denies  fever, sweats, chills weigh loss  HEENT: Denies blurred vision, double vision, ear pain, eye pain, hearing loss, nose bleeds, sore throat Cardiac:  No dizziness, chest pain or heaviness, chest tightness,edema Resp:   reports dyspnea chronically  Gi: Denies swallowing difficulty, stomach pain, nausea or vomiting, diarrhea, constipation, bowel incontinence Gu:  Denies bladder incontinence, burning urine Ext:   Denies Joint pain, stiffness or swelling Skin: Denies  skin rash, easy bruising or bleeding or hives  Endoc:  Denies polyuria, polydipsia , polyphagia or weight change Psych:   Denies depression, insomnia or hallucinations   Other:  All other systems negative   VS: BP (!) 138/58 (BP Location: Right Leg)   Pulse 67   Temp 98.5 F (36.9 C)   Resp 18   Ht 5\' 5"  (1.651 m)   Wt (!) 141.8 kg   SpO2 93%   BMI 52.02 kg/m      PHYSICAL EXAM    GENERAL:NAD, no fevers, chills, no weakness no fatigue HEAD: Normocephalic, atraumatic.  EYES: Pupils equal, round, reactive to light. Extraocular muscles intact. No scleral icterus.  MOUTH: Moist mucosal membrane. Dentition intact. No abscess noted.  EAR, NOSE, THROAT: Clear without exudates. No external lesions.  NECK: Supple. No thyromegaly. No nodules. No JVD.  PULMONARY: decreased breath sounds with mild rhonchi worse at bases bilaterally.  CARDIOVASCULAR: S1 and S2. Regular rate and rhythm. No murmurs, rubs, or gallops. No edema. Pedal pulses 2+ bilaterally.  GASTROINTESTINAL: Soft, nontender, nondistended. No masses. Positive bowel sounds. No hepatosplenomegaly.  MUSCULOSKELETAL:  No swelling, clubbing, or edema. Range of motion full in all extremities.  NEUROLOGIC: Cranial nerves II through XII are intact. No gross focal neurological deficits. Sensation intact. Reflexes intact.  SKIN: No ulceration, lesions, rashes, or cyanosis. Skin warm and dry. Turgor intact.  PSYCHIATRIC: Mood, affect within normal limits. The patient is awake, alert and oriented x 3. Insight, judgment intact.       IMAGING   \  ASSESSMENT/PLAN   Multifocal pneumonia -suspecti due to e.coli - present on admission  - COVID19 negative  - supplemental O2 during my evaluation room air -reviewed pertinent imaging with patient today -PT/OT for d/c planning  -please encourage patient to use incentive spirometer few times each hour while hospitalized.       Ecoli bacteremia and pneumonia   - patient without shock physiology     - ID consultation - appreciate input    - continue antibiotics , currently rocephin     OSA    Continue home CPAP with settings as per RT   - orders placed    Thank you for allowing me to participate in the care of this patient.   Patient/Family are satisfied with care plan and all questions have been answered.    Provider disclosure: Patient with at least one acute or chronic illness or injury that poses a threat to life or bodily function and is being managed actively during this encounter.  All of the below services have been performed independently by signing provider:  review of prior documentation from internal and or external health records.  Review of previous and current lab results.  Interview and comprehensive assessment during patient visit today. Review of current and previous chest radiographs/CT scans. Discussion of management and test interpretation with health care team and patient/family.   This document was prepared using Dragon voice recognition software and may include unintentional dictation errors.     Vida Rigger, M.D.  Division  of Pulmonary & Critical Care Medicine

## 2023-03-28 DIAGNOSIS — B9620 Unspecified Escherichia coli [E. coli] as the cause of diseases classified elsewhere: Secondary | ICD-10-CM | POA: Diagnosis not present

## 2023-03-28 DIAGNOSIS — R7881 Bacteremia: Secondary | ICD-10-CM | POA: Diagnosis not present

## 2023-03-28 LAB — CULTURE, BLOOD (ROUTINE X 2)
Culture: NO GROWTH
Culture: NO GROWTH
Special Requests: ADEQUATE
Special Requests: ADEQUATE

## 2023-03-29 ENCOUNTER — Telehealth: Payer: Self-pay | Admitting: *Deleted

## 2023-03-29 DIAGNOSIS — A4151 Sepsis due to Escherichia coli [E. coli]: Secondary | ICD-10-CM | POA: Diagnosis not present

## 2023-03-29 DIAGNOSIS — Z7982 Long term (current) use of aspirin: Secondary | ICD-10-CM | POA: Diagnosis not present

## 2023-03-29 DIAGNOSIS — E1122 Type 2 diabetes mellitus with diabetic chronic kidney disease: Secondary | ICD-10-CM | POA: Diagnosis not present

## 2023-03-29 DIAGNOSIS — Z7902 Long term (current) use of antithrombotics/antiplatelets: Secondary | ICD-10-CM | POA: Diagnosis not present

## 2023-03-29 DIAGNOSIS — Z6841 Body Mass Index (BMI) 40.0 and over, adult: Secondary | ICD-10-CM | POA: Diagnosis not present

## 2023-03-29 DIAGNOSIS — E11621 Type 2 diabetes mellitus with foot ulcer: Secondary | ICD-10-CM | POA: Diagnosis not present

## 2023-03-29 DIAGNOSIS — G4733 Obstructive sleep apnea (adult) (pediatric): Secondary | ICD-10-CM | POA: Diagnosis not present

## 2023-03-29 DIAGNOSIS — N1832 Chronic kidney disease, stage 3b: Secondary | ICD-10-CM | POA: Diagnosis not present

## 2023-03-29 DIAGNOSIS — E785 Hyperlipidemia, unspecified: Secondary | ICD-10-CM | POA: Diagnosis not present

## 2023-03-29 DIAGNOSIS — Z7984 Long term (current) use of oral hypoglycemic drugs: Secondary | ICD-10-CM | POA: Diagnosis not present

## 2023-03-29 DIAGNOSIS — I083 Combined rheumatic disorders of mitral, aortic and tricuspid valves: Secondary | ICD-10-CM | POA: Diagnosis not present

## 2023-03-29 DIAGNOSIS — L97419 Non-pressure chronic ulcer of right heel and midfoot with unspecified severity: Secondary | ICD-10-CM | POA: Diagnosis not present

## 2023-03-29 DIAGNOSIS — I5032 Chronic diastolic (congestive) heart failure: Secondary | ICD-10-CM | POA: Diagnosis not present

## 2023-03-29 DIAGNOSIS — N12 Tubulo-interstitial nephritis, not specified as acute or chronic: Secondary | ICD-10-CM | POA: Diagnosis not present

## 2023-03-29 DIAGNOSIS — I5023 Acute on chronic systolic (congestive) heart failure: Secondary | ICD-10-CM | POA: Diagnosis not present

## 2023-03-29 DIAGNOSIS — J690 Pneumonitis due to inhalation of food and vomit: Secondary | ICD-10-CM | POA: Diagnosis not present

## 2023-03-29 DIAGNOSIS — E1140 Type 2 diabetes mellitus with diabetic neuropathy, unspecified: Secondary | ICD-10-CM | POA: Diagnosis not present

## 2023-03-29 DIAGNOSIS — Z794 Long term (current) use of insulin: Secondary | ICD-10-CM | POA: Diagnosis not present

## 2023-03-29 DIAGNOSIS — I130 Hypertensive heart and chronic kidney disease with heart failure and stage 1 through stage 4 chronic kidney disease, or unspecified chronic kidney disease: Secondary | ICD-10-CM | POA: Diagnosis not present

## 2023-03-29 DIAGNOSIS — Z7951 Long term (current) use of inhaled steroids: Secondary | ICD-10-CM | POA: Diagnosis not present

## 2023-03-29 DIAGNOSIS — I2510 Atherosclerotic heart disease of native coronary artery without angina pectoris: Secondary | ICD-10-CM | POA: Diagnosis not present

## 2023-03-29 DIAGNOSIS — I214 Non-ST elevation (NSTEMI) myocardial infarction: Secondary | ICD-10-CM | POA: Diagnosis not present

## 2023-03-29 DIAGNOSIS — Z955 Presence of coronary angioplasty implant and graft: Secondary | ICD-10-CM | POA: Diagnosis not present

## 2023-03-29 LAB — LIPOPROTEIN A (LPA): Lipoprotein (a): 67 nmol/L — ABNORMAL HIGH (ref <75.0)

## 2023-03-29 NOTE — Progress Notes (Signed)
Care Coordination  Outreach Note  03/29/2023 Name: Julia Simmons MRN: [id number] DOB: 1961-01-11   Care Coordination Outreach Attempts: An unsuccessful telephone outreach was attempted today to offer the patient information about available care coordination services.  Follow Up Plan:  Additional outreach attempts will be made to offer the patient care coordination information and services.   Encounter Outcome:  No Answer  Burman Nieves, CCMA Care Coordination Care Guide Direct Dial: [phone]

## 2023-04-01 DIAGNOSIS — E782 Mixed hyperlipidemia: Secondary | ICD-10-CM | POA: Diagnosis not present

## 2023-04-01 DIAGNOSIS — Z23 Encounter for immunization: Secondary | ICD-10-CM | POA: Diagnosis not present

## 2023-04-01 DIAGNOSIS — E7800 Pure hypercholesterolemia, unspecified: Secondary | ICD-10-CM | POA: Diagnosis not present

## 2023-04-01 DIAGNOSIS — I5021 Acute systolic (congestive) heart failure: Secondary | ICD-10-CM | POA: Diagnosis not present

## 2023-04-01 DIAGNOSIS — I2510 Atherosclerotic heart disease of native coronary artery without angina pectoris: Secondary | ICD-10-CM | POA: Diagnosis not present

## 2023-04-01 DIAGNOSIS — Z794 Long term (current) use of insulin: Secondary | ICD-10-CM | POA: Diagnosis not present

## 2023-04-01 DIAGNOSIS — E119 Type 2 diabetes mellitus without complications: Secondary | ICD-10-CM | POA: Diagnosis not present

## 2023-04-01 DIAGNOSIS — I63511 Cerebral infarction due to unspecified occlusion or stenosis of right middle cerebral artery: Secondary | ICD-10-CM | POA: Diagnosis not present

## 2023-04-01 DIAGNOSIS — I255 Ischemic cardiomyopathy: Secondary | ICD-10-CM | POA: Diagnosis not present

## 2023-04-01 DIAGNOSIS — I10 Essential (primary) hypertension: Secondary | ICD-10-CM | POA: Diagnosis not present

## 2023-04-01 NOTE — Progress Notes (Signed)
Care Coordination   Note   04/01/2023 Name: Julia Simmons MRN: [id number] DOB: [birth year]  Julia Simmons is a 62 y.o. year old female who sees Merlinda Frederick, Merleen Milliner, MD for primary care. I reached out to Cordella Register by phone today to offer care coordination services.  Ms. Kundrat was given information about Care Coordination services today including:   The Care Coordination services include support from the care team which includes your Nurse Coordinator, Clinical Social Worker, or Pharmacist.  The Care Coordination team is here to help remove barriers to the health concerns and goals most important to you. Care Coordination services are voluntary, and the patient may decline or stop services at any time by request to their care team member.   Care Coordination Consent Status: Patient agreed to services and verbal consent obtained.   Follow up plan:  Telephone appointment with care coordination team member scheduled for:  04/09/2023  Encounter Outcome:  Patient Scheduled from referral   Burman Nieves, [facility] Care Coordination Care Guide Direct Dial: [phone]

## 2023-04-02 DIAGNOSIS — I10 Essential (primary) hypertension: Secondary | ICD-10-CM | POA: Diagnosis not present

## 2023-04-02 DIAGNOSIS — Z794 Long term (current) use of insulin: Secondary | ICD-10-CM | POA: Diagnosis not present

## 2023-04-02 DIAGNOSIS — N1831 Chronic kidney disease, stage 3a: Secondary | ICD-10-CM | POA: Diagnosis not present

## 2023-04-02 DIAGNOSIS — A4151 Sepsis due to Escherichia coli [E. coli]: Secondary | ICD-10-CM | POA: Diagnosis not present

## 2023-04-02 DIAGNOSIS — I2510 Atherosclerotic heart disease of native coronary artery without angina pectoris: Secondary | ICD-10-CM | POA: Diagnosis not present

## 2023-04-02 DIAGNOSIS — E1122 Type 2 diabetes mellitus with diabetic chronic kidney disease: Secondary | ICD-10-CM | POA: Diagnosis not present

## 2023-04-02 DIAGNOSIS — E7800 Pure hypercholesterolemia, unspecified: Secondary | ICD-10-CM | POA: Diagnosis not present

## 2023-04-09 ENCOUNTER — Ambulatory Visit: Payer: Self-pay | Admitting: *Deleted

## 2023-04-09 DIAGNOSIS — E1140 Type 2 diabetes mellitus with diabetic neuropathy, unspecified: Secondary | ICD-10-CM | POA: Diagnosis not present

## 2023-04-09 DIAGNOSIS — M65979 Unspecified synovitis and tenosynovitis, unspecified ankle and foot: Secondary | ICD-10-CM | POA: Diagnosis not present

## 2023-04-09 DIAGNOSIS — M79672 Pain in left foot: Secondary | ICD-10-CM | POA: Diagnosis not present

## 2023-04-09 DIAGNOSIS — Z794 Long term (current) use of insulin: Secondary | ICD-10-CM | POA: Diagnosis not present

## 2023-04-09 NOTE — Patient Outreach (Signed)
Care Coordination   Initial Visit Note   04/09/2023 Name: Julia Simmons MRN: [id number] DOB: 1961-04-22  Julia Simmons is a 62 y.o. year old female who sees Julia Simmons, Julia Milliner, MD for primary care. I spoke with  Julia Simmons by phone today.  What matters to the patients health and wellness today?  Was recently admitted to hospital, had heart cath done, report 99% blockage, but inability to have stent placed.  She was referred to [facility] for specialized heart cath with stent placement, but was told once she got there it would not be covered by insurance.  She is now waiting for insurance authorization, informed that it would be 7-14 business days before outcome of request.  Denies any urgent concerns, denies chest pain, encouraged to contact this care manager with questions.     Goals Addressed             This Visit's Progress    Effective management of chronic medical conditions (CAD/DM)       Interventions Today    Flowsheet Row Most Recent Value  Chronic Disease   Chronic disease during today's visit Diabetes, Other, Congestive Heart Failure (CHF)  [CAD]  General Interventions   General Interventions Discussed/Reviewed General Interventions Reviewed, Labs, Annual Foot Exam, Doctor Visits, Vaccines, Durable Medical Equipment (DME)  Labs Hgb A1c every 3 months  Vaccines Flu  [waiting until she is more stable to get flu shot]  Doctor Visits Discussed/Reviewed Doctor Visits Reviewed, PCP, Specialist  [upcoming podiatry 11/5, Endocrine 11/12, and cardiology 11/19]  Durable Medical Equipment (DME) Dan Humphreys, Other, BP Cuff, Glucomoter  [has walker and motorized scooter]  PCP/Specialist Visits Compliance with follow-up visit  [cardiology, PCP, and podiatry appts done since discharge]  Exercise Interventions   Exercise Discussed/Reviewed Weight Managment  Weight Management Weight maintenance  [monitor weights daily]  Education Interventions   Education Provided Provided Education   Provided Verbal Education On Nutrition, Foot Care, Labs, Blood Sugar Monitoring, When to see the doctor, Insurance Plans, Medication  [Waiting for insurance company to approve intervention for heart blockage at [facility]. monitoring BP, blood sugar, HR, pulse ox, and weights daily. Has healing ulcer on right foot and diagnosed with left charcot foot, currently in a walking boot]  Labs Reviewed Hgb A1c  [Currently 8.3, aware of goal of 7]  Nutrition Interventions   Nutrition Discussed/Reviewed Nutrition Discussed, Carbohydrate meal planning, Adding fruits and vegetables, Decreasing sugar intake, Decreasing fats, Increasing proteins  Pharmacy Interventions   Pharmacy Dicussed/Reviewed Pharmacy Topics Reviewed, Affording Medications  [Reviewed medication changes, report compliance]              SDOH assessments and interventions completed:  Yes{THN Tip this will not be part of the note when signed-REQUIRED REPORT FIELD DO NOT DELETE (Optional):27901}  SDOH Interventions Today    Flowsheet Row Most Recent Value  SDOH Interventions   Food Insecurity Interventions Intervention Not Indicated  Housing Interventions Intervention Not Indicated  Transportation Interventions Intervention Not Indicated        Care Coordination Interventions:  Yes, provided {THN Tip this will not be part of the note when signed-REQUIRED REPORT FIELD DO NOT DELETE (Optional):27901}  Follow up plan: Follow up call scheduled for 10/30    Encounter Outcome:  Patient Visit Completed {THN Tip this will not be part of the note when signed-REQUIRED REPORT FIELD DO NOT DELETE (Optional):27901}  Kemper Durie RN, MSN, CCM [place]  [facility], [facility] RN Care Coordinator Direct Dial: [phone] /  Main [phone] Fax [phone] Email: [email or web address] Website: [email or web address]

## 2023-04-10 NOTE — Patient Instructions (Signed)
Visit Information  Thank you for taking time to visit with me today. Please don't hesitate to contact me if I can be of assistance to you before our next scheduled telephone appointment.  Following are the goals we discussed today:  Continue monitoring BP, HR, pulse ox, weight, and blood sugars daily. Follow up with HTA next week if no call back regarding authorization.  Our next appointment is by telephone on 10/30  Please call the care guide team at [phone] if you need to cancel or reschedule your appointment.   Please call the Suicide and Crisis Lifeline: 988 call the [place] National Suicide Prevention Lifeline: (220) 171-3066 or TTY: 6573000135 TTY 416-300-3068) to talk to a trained counselor call 1-800-273-TALK (toll free, 24 hour hotline) call 911 if you are experiencing a Mental Health or Behavioral Health Crisis or need someone to talk to.  The patient verbalized understanding of instructions, educational materials, and care plan provided today and agreed to receive a mailed copy of patient instructions, educational materials, and care plan.   The patient has been provided with contact information for the care management team and has been advised to call with any health related questions or concerns.   Kemper Durie RN, MSN, CCM [facility], [facility] RN Care Coordinator Direct Dial: [phone] / Main [phone] Fax [phone] Email: [email or web address] Website: [email or web address]

## 2023-04-11 DIAGNOSIS — I330 Acute and subacute infective endocarditis: Secondary | ICD-10-CM | POA: Diagnosis not present

## 2023-04-17 ENCOUNTER — Ambulatory Visit: Payer: Self-pay | Admitting: *Deleted

## 2023-04-17 NOTE — Patient Outreach (Signed)
Care Coordination   Follow Up Visit Note   04/17/2023 Name: Julia Simmons MRN: [id number] DOB: 09-Feb-1961  Julia Simmons is a 62 y.o. year old female who sees Merlinda Frederick, Merleen Milliner, MD for primary care. I spoke with  Julia Simmons by phone today.  What matters to the patients health and wellness today?  Scheduling appointment for cardiac procedure.  She will call office if she has not received call by next week.     Goals Addressed             This Visit's Progress    Effective management of chronic medical conditions (CAD/DM)   On track    Interventions Today    Flowsheet Row Most Recent Value  Chronic Disease   Chronic disease during today's visit Congestive Heart Failure (CHF), Other  [CAD]  General Interventions   General Interventions Discussed/Reviewed General Interventions Reviewed, Doctor Visits  Doctor Visits Discussed/Reviewed Doctor Visits Reviewed, Specialist  [Waiting for [facility] cardiology to call with appointment with procedure]  PCP/Specialist Visits Compliance with follow-up visit  Education Interventions   Education Provided Provided Education  Provided Verbal Education On Insurance Plans  [State she received call back from insurance saying she was approved for procedure]              SDOH assessments and interventions completed:  No     Care Coordination Interventions:  Yes, provided   Follow up plan: Follow up call scheduled for 11/21    Encounter Outcome:  Patient Visit Completed   Kemper Durie RN, MSN, CCM [place]  [facility], [facility] RN Care Coordinator Direct Dial: [phone] / Main [phone] Fax [phone] Email: [email or web address] Website: [email or web address]

## 2023-04-23 DIAGNOSIS — M14671 Charcot's joint, right ankle and foot: Secondary | ICD-10-CM | POA: Diagnosis not present

## 2023-04-23 DIAGNOSIS — L97511 Non-pressure chronic ulcer of other part of right foot limited to breakdown of skin: Secondary | ICD-10-CM | POA: Diagnosis not present

## 2023-04-23 DIAGNOSIS — M79672 Pain in left foot: Secondary | ICD-10-CM | POA: Diagnosis not present

## 2023-04-23 DIAGNOSIS — E1140 Type 2 diabetes mellitus with diabetic neuropathy, unspecified: Secondary | ICD-10-CM | POA: Diagnosis not present

## 2023-04-23 DIAGNOSIS — M65979 Unspecified synovitis and tenosynovitis, unspecified ankle and foot: Secondary | ICD-10-CM | POA: Diagnosis not present

## 2023-04-23 DIAGNOSIS — Z794 Long term (current) use of insulin: Secondary | ICD-10-CM | POA: Diagnosis not present

## 2023-04-30 DIAGNOSIS — E113293 Type 2 diabetes mellitus with mild nonproliferative diabetic retinopathy without macular edema, bilateral: Secondary | ICD-10-CM | POA: Diagnosis not present

## 2023-04-30 DIAGNOSIS — E1159 Type 2 diabetes mellitus with other circulatory complications: Secondary | ICD-10-CM | POA: Diagnosis not present

## 2023-04-30 DIAGNOSIS — Z794 Long term (current) use of insulin: Secondary | ICD-10-CM | POA: Diagnosis not present

## 2023-04-30 DIAGNOSIS — E785 Hyperlipidemia, unspecified: Secondary | ICD-10-CM | POA: Diagnosis not present

## 2023-04-30 DIAGNOSIS — E11610 Type 2 diabetes mellitus with diabetic neuropathic arthropathy: Secondary | ICD-10-CM | POA: Diagnosis not present

## 2023-04-30 DIAGNOSIS — I5020 Unspecified systolic (congestive) heart failure: Secondary | ICD-10-CM | POA: Diagnosis not present

## 2023-04-30 DIAGNOSIS — E1142 Type 2 diabetes mellitus with diabetic polyneuropathy: Secondary | ICD-10-CM | POA: Diagnosis not present

## 2023-04-30 DIAGNOSIS — E1169 Type 2 diabetes mellitus with other specified complication: Secondary | ICD-10-CM | POA: Diagnosis not present

## 2023-04-30 DIAGNOSIS — E1165 Type 2 diabetes mellitus with hyperglycemia: Secondary | ICD-10-CM | POA: Diagnosis not present

## 2023-04-30 DIAGNOSIS — E1122 Type 2 diabetes mellitus with diabetic chronic kidney disease: Secondary | ICD-10-CM | POA: Diagnosis not present

## 2023-04-30 DIAGNOSIS — E1129 Type 2 diabetes mellitus with other diabetic kidney complication: Secondary | ICD-10-CM | POA: Diagnosis not present

## 2023-05-09 ENCOUNTER — Ambulatory Visit: Payer: Self-pay | Admitting: *Deleted

## 2023-05-09 DIAGNOSIS — E1169 Type 2 diabetes mellitus with other specified complication: Secondary | ICD-10-CM

## 2023-05-09 NOTE — Patient Outreach (Signed)
  Care Coordination   Follow Up Visit Note   05/09/2023 Name: Julia Simmons MRN: [id number] DOB: 31-Mar-1961  Julia Simmons is a 62 y.o. year old female who sees Merlinda Frederick, Merleen Milliner, MD for primary care. I spoke with  Julia Simmons by phone today.  What matters to the patients health and wellness today?  Happy to have appointment with [facility] cardiology specialist, eager to see when procedure will be scheduled.     Goals Addressed             This Visit's Progress    Effective management of chronic medical conditions (CAD/DM)   On track    Interventions Today    Flowsheet Row Most Recent Value  Chronic Disease   Chronic disease during today's visit Diabetes, Other  [CAD]  General Interventions   General Interventions Discussed/Reviewed General Interventions Reviewed, Doctor Visits  Doctor Visits Discussed/Reviewed Doctor Visits Reviewed, Specialist, PCP  [Cardiology on 12/10, podiatry on 12/17, and [facility] cardio specialist on 12/18]  PCP/Specialist Visits Compliance with follow-up visit  [Endocrinology visit last week]  Education Interventions   Education Provided Provided Education  Provided Verbal Education On Foot Care, Nutrition, Blood Sugar Monitoring, When to see the doctor, Medication, Labs  [blood sugars 80-200, Mounjaro now ordered but not approved by insurance, waiting for authorization.]  Labs Reviewed Hgb A1c  [Recent 8.3]  Nutrition Interventions   Nutrition Discussed/Reviewed Nutrition Reviewed, Decreasing sugar intake, Carbohydrate meal planning, Adding fruits and vegetables  Pharmacy Interventions   Pharmacy Dicussed/Reviewed Pharmacy Topics Reviewed, Affording Medications, Referral to Pharmacist  Referral to Pharmacist Cannot afford medications  [Waiting for authorization for [facility], agrees to have pharmacy team follow up]                SDOH assessments and interventions completed:  No     Care Coordination Interventions:  Yes, provided    Follow up plan: Follow up call scheduled for 12/26    Encounter Outcome:  Patient Visit Completed   Rodney Langton, RN, MSN, CCM [place]  [facility], [facility] RN Care Coordinator Direct Dial: [phone] / Main [phone] Fax [phone] Email: [email or web address] Website: [email or web address]

## 2023-05-15 DIAGNOSIS — I509 Heart failure, unspecified: Secondary | ICD-10-CM | POA: Diagnosis not present

## 2023-05-15 DIAGNOSIS — Z794 Long term (current) use of insulin: Secondary | ICD-10-CM | POA: Diagnosis not present

## 2023-05-15 DIAGNOSIS — Z7984 Long term (current) use of oral hypoglycemic drugs: Secondary | ICD-10-CM | POA: Diagnosis not present

## 2023-05-15 DIAGNOSIS — E119 Type 2 diabetes mellitus without complications: Secondary | ICD-10-CM | POA: Diagnosis not present

## 2023-05-15 DIAGNOSIS — I2510 Atherosclerotic heart disease of native coronary artery without angina pectoris: Secondary | ICD-10-CM | POA: Diagnosis not present

## 2023-05-15 DIAGNOSIS — Z7982 Long term (current) use of aspirin: Secondary | ICD-10-CM | POA: Diagnosis not present

## 2023-05-20 ENCOUNTER — Telehealth: Payer: Self-pay | Admitting: Pharmacist

## 2023-05-20 NOTE — Progress Notes (Signed)
  Reason for referral: Medication Cost  [facility] pharmacy referral is being closed due to the following reasons:   Spoke to patient and reviewed medication costs from the 2025 [employer] HMO C-SNP plan. Answered all questions and patient was appreciative of my call.   [facility] is happy to assist the patient/family in the future for clinical pharmacy needs, following a discussion from your team about [facility] outreach. Thank you for allowing [facility] to be a part of your patient's care.   Reynold Bowen, PharmD Clinical Pharmacist [place] Direct Dial: [phone]

## 2023-05-28 DIAGNOSIS — E7800 Pure hypercholesterolemia, unspecified: Secondary | ICD-10-CM | POA: Diagnosis not present

## 2023-05-28 DIAGNOSIS — E119 Type 2 diabetes mellitus without complications: Secondary | ICD-10-CM | POA: Diagnosis not present

## 2023-05-28 DIAGNOSIS — I2510 Atherosclerotic heart disease of native coronary artery without angina pectoris: Secondary | ICD-10-CM | POA: Diagnosis not present

## 2023-05-28 DIAGNOSIS — Z794 Long term (current) use of insulin: Secondary | ICD-10-CM | POA: Diagnosis not present

## 2023-05-28 DIAGNOSIS — I10 Essential (primary) hypertension: Secondary | ICD-10-CM | POA: Diagnosis not present

## 2023-05-28 DIAGNOSIS — I255 Ischemic cardiomyopathy: Secondary | ICD-10-CM | POA: Diagnosis not present

## 2023-05-28 DIAGNOSIS — I63511 Cerebral infarction due to unspecified occlusion or stenosis of right middle cerebral artery: Secondary | ICD-10-CM | POA: Diagnosis not present

## 2023-05-28 DIAGNOSIS — Z955 Presence of coronary angioplasty implant and graft: Secondary | ICD-10-CM | POA: Diagnosis not present

## 2023-06-05 DIAGNOSIS — I25118 Atherosclerotic heart disease of native coronary artery with other forms of angina pectoris: Secondary | ICD-10-CM | POA: Diagnosis not present

## 2023-06-05 DIAGNOSIS — E782 Mixed hyperlipidemia: Secondary | ICD-10-CM | POA: Diagnosis not present

## 2023-06-05 DIAGNOSIS — R9431 Abnormal electrocardiogram [ECG] [EKG]: Secondary | ICD-10-CM | POA: Diagnosis not present

## 2023-06-05 DIAGNOSIS — I10 Essential (primary) hypertension: Secondary | ICD-10-CM | POA: Diagnosis not present

## 2023-06-05 DIAGNOSIS — I110 Hypertensive heart disease with heart failure: Secondary | ICD-10-CM | POA: Diagnosis not present

## 2023-06-05 DIAGNOSIS — I2582 Chronic total occlusion of coronary artery: Secondary | ICD-10-CM | POA: Diagnosis not present

## 2023-06-05 DIAGNOSIS — I5020 Unspecified systolic (congestive) heart failure: Secondary | ICD-10-CM | POA: Diagnosis not present

## 2023-06-13 ENCOUNTER — Ambulatory Visit: Payer: Self-pay | Admitting: *Deleted

## 2023-06-13 NOTE — Patient Outreach (Signed)
  Care Coordination   Follow Up Visit Note   06/13/2023 Name: Julia Simmons MRN: [id number] DOB: 16-Oct-1960  Julia Simmons is a 62 y.o. year old female who sees Merlinda Frederick, Merleen Milliner, MD for primary care. I spoke with  Julia Simmons by phone today.  What matters to the patients health and wellness today?  Does not have date for procedure yet, state she was told she will now need open heart surgery instead of stent placements.  She is concerned because report she was previously told she was not a candidate for surgery and she would not survive.  She will need to speak with her family and make decision prior to next appointment on 1/7.     Goals Addressed             This Visit's Progress    Effective management of chronic medical conditions (CAD/DM)   On track    Interventions Today    Flowsheet Row Most Recent Value  Chronic Disease   Chronic disease during today's visit Other  [CAD]  General Interventions   General Interventions Discussed/Reviewed General Interventions Reviewed, Doctor Visits  [Has to make decision between cardiac stent procedure and open heart surgery]  Doctor Visits Discussed/Reviewed Doctor Visits Reviewed, Specialist  [facility] cardiology 1/7]  PCP/Specialist Visits Compliance with follow-up visit  Education Interventions   Education Provided Provided Education  Provided Verbal Education On When to see the doctor, Medication, Other  [Encouraged to call [facility] office and request date/time for provider to call to speak with patient and family to answer questions]              SDOH assessments and interventions completed:  No     Care Coordination Interventions:  Yes, provided   Follow up plan: Follow up call scheduled for 1/9    Encounter Outcome:  Patient Visit Completed   Rodney Langton, RN, MSN, CCM [place]  [facility], [facility] RN Care Coordinator Direct Dial: [phone] / Main [phone] Fax  [phone] Email: [email or web address]: [email or web address]

## 2023-06-27 ENCOUNTER — Ambulatory Visit: Payer: Self-pay | Admitting: *Deleted

## 2023-06-27 NOTE — Patient Outreach (Signed)
  Care Coordination   Follow Up Visit Note   06/27/2023 Name: Julia Simmons MRN: [id number] DOB: [birth year]  Julia Simmons is a 63 y.o. year old female who sees Marikay, Eva POUR, MD for primary care. I spoke with  Julia Simmons by phone today.  What matters to the patients health and wellness today?  Patient report she did not see cardiologist at [facility] for surgery consult due to insurance not covering any providers/procedures at [facility].  Per insurance, she is to go back to her regular cardiologist and develop a new plan for cardiac intervention.     Goals Addressed             This Visit's Progress    Effective management of chronic medical conditions (CAD/DM)   On track    Interventions Today    Flowsheet Row Most Recent Value  Chronic Disease   Chronic disease during today's visit Other, Diabetes  [CAD]  General Interventions   General Interventions Discussed/Reviewed General Interventions Reviewed, Doctor Visits  Doctor Visits Discussed/Reviewed Doctor Visits Reviewed, Specialist  [1/21 with cardiology]  PCP/Specialist Visits Compliance with follow-up visit  Education Interventions   Education Provided Provided Education  Provided Verbal Education On When to see the doctor, Other, Insurance Plans  [Encouraged to continue to speak with cardiology regarding new plan to treat CAD]              SDOH assessments and interventions completed:  No     Care Coordination Interventions:  Yes, provided   Follow up plan: Follow up call scheduled for 1/23    Encounter Outcome:  Patient Visit Completed   Odella Ku, RN, MSN, CCM [place]  [facility], [facility] RN Care Coordinator Direct Dial: [phone] / Main [phone] Fax [phone] Email: [email or web address]

## 2023-07-09 DIAGNOSIS — I2510 Atherosclerotic heart disease of native coronary artery without angina pectoris: Secondary | ICD-10-CM | POA: Diagnosis not present

## 2023-07-09 DIAGNOSIS — E119 Type 2 diabetes mellitus without complications: Secondary | ICD-10-CM | POA: Diagnosis not present

## 2023-07-09 DIAGNOSIS — J449 Chronic obstructive pulmonary disease, unspecified: Secondary | ICD-10-CM | POA: Diagnosis not present

## 2023-07-09 DIAGNOSIS — E7800 Pure hypercholesterolemia, unspecified: Secondary | ICD-10-CM | POA: Diagnosis not present

## 2023-07-09 DIAGNOSIS — Z23 Encounter for immunization: Secondary | ICD-10-CM | POA: Diagnosis not present

## 2023-07-09 DIAGNOSIS — I10 Essential (primary) hypertension: Secondary | ICD-10-CM | POA: Diagnosis not present

## 2023-07-09 DIAGNOSIS — I5021 Acute systolic (congestive) heart failure: Secondary | ICD-10-CM | POA: Diagnosis not present

## 2023-07-09 DIAGNOSIS — I63511 Cerebral infarction due to unspecified occlusion or stenosis of right middle cerebral artery: Secondary | ICD-10-CM | POA: Diagnosis not present

## 2023-07-09 DIAGNOSIS — Z955 Presence of coronary angioplasty implant and graft: Secondary | ICD-10-CM | POA: Diagnosis not present

## 2023-07-09 DIAGNOSIS — E782 Mixed hyperlipidemia: Secondary | ICD-10-CM | POA: Diagnosis not present

## 2023-07-09 DIAGNOSIS — I255 Ischemic cardiomyopathy: Secondary | ICD-10-CM | POA: Diagnosis not present

## 2023-07-09 DIAGNOSIS — I5023 Acute on chronic systolic (congestive) heart failure: Secondary | ICD-10-CM | POA: Diagnosis not present

## 2023-07-11 ENCOUNTER — Ambulatory Visit: Payer: Self-pay | Admitting: *Deleted

## 2023-07-11 NOTE — Patient Outreach (Signed)
  Care Coordination   Follow Up Visit Note   07/11/2023 Name: Julia Simmons MRN: [id number] DOB: Jun 24, 1960  Julia Simmons is a 63 y.o. year old female who sees Merlinda Frederick, Merleen Milliner, MD for primary care. I spoke with  Julia Simmons by phone today.  What matters to the patients health and wellness today?  Patient report attending cardiology appointment this week, plan is to see cardiac surgeon within network for assessment.  If unable to develop treatment plan and need referral back to [facility], [employer] will be contacted.     Goals Addressed             This Visit's Progress    Effective management of chronic medical conditions (CAD/DM)       Interventions Today    Flowsheet Row Most Recent Value  Chronic Disease   Chronic disease during today's visit Congestive Heart Failure (CHF), Hypertension (HTN), Diabetes, Other  [CAD]  General Interventions   General Interventions Discussed/Reviewed General Interventions Reviewed, Doctor Visits  Doctor Visits Discussed/Reviewed Doctor Visits Reviewed, Specialist  [Reviewed upcoming: cardiac surgery on 1/31]  PCP/Specialist Visits Compliance with follow-up visit  Education Interventions   Education Provided Provided Education  Provided Verbal Education On When to see the doctor, [employer] she received bill from previously approved office visit with [facility] last year, advised to call insurance and file appeal]              SDOH assessments and interventions completed:  No     Care Coordination Interventions:  Yes, provided   Follow up plan: Follow up call scheduled for 2/6    Encounter Outcome:  Patient Visit Completed   Rodney Langton, RN, MSN, CCM [place]  [facility], [facility] RN Care Coordinator Direct Dial: [phone] / Main [phone] Fax [phone] Email: [email or web address]

## 2023-07-19 ENCOUNTER — Institutional Professional Consult (permissible substitution): Payer: PPO | Admitting: Thoracic Surgery (Cardiothoracic Vascular Surgery)

## 2023-07-19 VITALS — BP 138/89 | HR 77 | Resp 18 | Wt 309.0 lb

## 2023-07-19 DIAGNOSIS — I2510 Atherosclerotic heart disease of native coronary artery without angina pectoris: Secondary | ICD-10-CM

## 2023-07-19 NOTE — Progress Notes (Signed)
[address]       [place] [address]             [id number]        Julia Simmons [facility] Medical Record #[id number] Date of Birth: 11/03/1960  Referring: Marcina Millard, MD Primary Care: Patrice Paradise, MD Primary Cardiologist:None  Chief Complaint:    Chief Complaint  Patient presents with   Coronary Artery Disease    Cath 10/8, [facility] 10/7    History of Present Illness:     Julia Simmons 63 y.o. female presents for surgical evaluation of two-vessel coronary artery disease.  She was recently admitted for urosepsis and underwent a left heart catheterization which showed blockage of distal to her previous LAD PCI.  From a symptomatic standpoint she denies any chest pain or shortness of breath.  She does have chronic lower extremity edema.  This has been unchanged.      Past Medical History:  Diagnosis Date   Charcot's joint of foot    Coronary artery disease    Diabetes mellitus with neuropathy (HCC)    Diabetic retinopathy (HCC)    Hypertension    Morbid obesity with BMI of 50.0-59.9, adult (HCC)    Sleep apnea    Stroke [facility])     Past Surgical History:  Procedure Laterality Date   CATARACT EXTRACTION W/PHACO Right 07/18/2022   Procedure: CATARACT EXTRACTION PHACO AND INTRAOCULAR LENS PLACEMENT (IOC) RIGHT DIABETIC HEALON 5 VISION BLUE 33.79 2:31.3;  Surgeon: Lockie Mola, MD;  Location: [facility];  Service: Ophthalmology;  Laterality: Right;   CESAREAN SECTION     CORONARY BALLOON ANGIOPLASTY N/A 03/26/2023   Procedure: CORONARY BALLOON ANGIOPLASTY;  Surgeon: Marcina Millard, MD;  Location: [facility] INVASIVE CV LAB;  Service: Cardiovascular;  Laterality: N/A;   LEFT HEART CATH AND CORONARY ANGIOGRAPHY N/A 03/26/2023   Procedure: LEFT HEART CATH AND CORONARY ANGIOGRAPHY possible PCI and stent;  Surgeon: Marcina Millard, MD;  Location: [facility] INVASIVE CV LAB;  Service: Cardiovascular;  Laterality: N/A;   TEE  WITHOUT CARDIOVERSION N/A 03/25/2023   Procedure: TRANSESOPHAGEAL ECHOCARDIOGRAM;  Surgeon: Kathryne Gin, MD;  Location: [facility] ORS;  Service: Cardiovascular;  Laterality: N/A;  11am    Social History:  Social History   Tobacco Use  Smoking Status Never  Smokeless Tobacco Never    Social History   Substance and Sexual Activity  Alcohol Use Not Currently     Allergies  Allergen Reactions   Hydrochlorothiazide Swelling    Facial swelling   Sulfa Antibiotics Swelling    Facial swelling      Current Outpatient Medications  Medication Sig Dispense Refill   albuterol (VENTOLIN HFA) 108 (90 Base) MCG/ACT inhaler Inhale into the lungs.     aspirin EC 81 MG tablet Take 81 mg by mouth daily.     atorvastatin (LIPITOR) 80 MG tablet Take 1 tablet (80 mg total) by mouth daily. 30 tablet 1   clopidogrel (PLAVIX) 75 MG tablet Take 75 mg by mouth daily.     Continuous Blood Gluc Sensor (FREESTYLE LIBRE SENSOR SYSTEM) MISC Use 1 kit every 14 (fourteen) days for glucose monitoring     furosemide (LASIX) 40 MG tablet Take 1 tablet (40 mg total) by mouth 2 (two) times daily. 60 tablet 1   insulin glargine (LANTUS) 100 UNIT/ML injection Inject 100 Units into the skin at bedtime.     linagliptin (TRADJENTA) 5 MG TABS tablet Take 1 tablet (5  mg total) by mouth daily. 30 tablet 1   metFORMIN (GLUCOPHAGE) 500 MG tablet Take 1,000 mg by mouth daily with breakfast.     metFORMIN (GLUCOPHAGE) 500 MG tablet Take 1,000 mg by mouth daily with supper.     metoprolol succinate (TOPROL-XL) 25 MG 24 hr tablet Take 1 tablet (25 mg total) by mouth 2 (two) times daily. 30 tablet 1   oxybutynin (DITROPAN-XL) 10 MG 24 hr tablet Take 1 tablet by mouth daily.     sacubitril-valsartan (ENTRESTO) 49-51 MG Take 1 tablet by mouth 2 (two) times daily. 60 tablet 1   spironolactone (ALDACTONE) 25 MG tablet Take by mouth.     budesonide (PULMICORT) 0.5 MG/2ML nebulizer solution Take 2 mLs (0.5 mg total) by nebulization  every 12 (twelve) hours. 120 mL 0   Insulin Aspart FlexPen (NOVOLOG) 100 UNIT/ML Inject 10 Units into the skin 3 (three) times daily before meals. Before lunch and dinner 3 mL 1   No current facility-administered medications for this visit.    (Not in a hospital admission)   Family History  Problem Relation Age of Onset   Breast cancer Neg Hx      Review of Systems:   Review of Systems  Constitutional:  Negative for malaise/fatigue and weight loss.  Respiratory:  Negative for shortness of breath.   Cardiovascular:  Negative for chest pain.  Neurological: Negative.       Physical Exam: BP 138/89   Pulse 77   Resp 18   Wt (!) 309 lb (140.2 kg)   SpO2 93% Comment: RA  BMI 51.42 kg/m  Physical Exam Constitutional:      General: She is not in acute distress.    Appearance: She is not ill-appearing.  HENT:     Head: Normocephalic and atraumatic.  Eyes:     Extraocular Movements: Extraocular movements intact.  Cardiovascular:     Rate and Rhythm: Normal rate.  Pulmonary:     Effort: Pulmonary effort is normal. No respiratory distress.  Abdominal:     General: There is no distension.  Musculoskeletal:        General: Normal range of motion.     Cervical back: Normal range of motion.  Skin:    General: Skin is warm and dry.  Neurological:     General: No focal deficit present.     Mental Status: She is alert and oriented to person, place, and time.        Recent Lab Findings: Lab Results  Component Value Date   WBC 7.6 03/27/2023   HGB 11.7 (L) 03/27/2023   HCT 36.4 03/27/2023   PLT 358 03/27/2023   GLUCOSE 164 (H) 03/27/2023   ALT 19 03/20/2023   AST 27 03/20/2023   NA 139 03/27/2023   K 3.4 (L) 03/27/2023   CL 102 03/27/2023   CREATININE 1.21 (H) 03/27/2023   BUN 31 (H) 03/27/2023   CO2 25 03/27/2023   INR 1.1 03/20/2023   HGBA1C 8.3 (H) 03/20/2023      Assessment / Plan:   63 year old female with two-vessel coronary artery disease.   Additionally she has an echocardiogram which shows a reduced LVEF.  Symptomatically she does not have any anginal complaints or any shortness of breath.  On review of her left heart catheterization, she does have a stenosis distal to her LAD stent however the vessel is quite small and likely not a good target for surgical revascularization.  I would recommend medical therapy for now especially  since she is asymptomatic.     I  spent 40 minutes counseling the patient face to face.   Corliss Skains 07/19/2023 1:31 PM

## 2023-07-25 ENCOUNTER — Ambulatory Visit: Payer: Self-pay | Admitting: *Deleted

## 2023-07-25 NOTE — Patient Outreach (Signed)
  Care Coordination   Follow Up Visit Note   07/25/2023 Name: Julia Simmons MRN: [id number] DOB: Mar 02, 1961  Julia Simmons is a 63 y.o. year old female who sees Julia, Eva POUR, MD for primary care. I spoke with  Julia Simmons by phone today.  What matters to the patients health and wellness today?  Patient report she has been doing well, denies any chest pain or discomfort.  Will notify cardiology team if this changes.     Goals Addressed             This Visit's Progress    Effective management of chronic medical conditions (CAD/DM)   On track    Interventions Today    Flowsheet Row Most Recent Value  Chronic Disease   Chronic disease during today's visit Diabetes, Congestive Heart Failure (CHF), Other, Hypertension (HTN)  [CAD]  General Interventions   General Interventions Discussed/Reviewed General Interventions Reviewed, Doctor Visits  Doctor Visits Discussed/Reviewed Doctor Visits Reviewed, Specialist, PCP  Julia Simmons upcoming: endocrinology 2/20, PCP 3/10, cardiology 4/25.  Has appointment on schedule for endocrine team at [facility], she will call to cancel, insurance does not cover [facility]]  PCP/Specialist Visits Compliance with follow-up visit  [Reviewed cardiac surgical consult, will treat medically, no surgical intervention planned at this time]  Exercise Interventions   Exercise Discussed/Reviewed Weight Managment  Weight Management Weight maintenance  Education Interventions   Education Provided Provided Education  Provided Verbal Education On Labs, Blood Sugar Monitoring, Medication, When to see the doctor, Nutrition  [meds reviewed, no change after most recent office visit. Will continue to take and will monitor blood sugar, BP, HR for daily trends]  Labs Reviewed Hgb A1c  [Current A1C is 8.3, aware of goal less than 7]  Nutrition Interventions   Nutrition Discussed/Reviewed Nutrition Reviewed, Adding fruits and vegetables, Decreasing fats, Decreasing salt,  Decreasing sugar intake              SDOH assessments and interventions completed:  No     Care Coordination Interventions:  Yes, provided   Follow up plan: Follow up call scheduled for 4/29    Encounter Outcome:  Patient Visit Completed   Julia Ku, RN, MSN, CCM [place]  [facility], [facility] RN Care Coordinator Direct Dial: [phone] / Main [phone] Fax [phone] Email: [email or web address]: [email or web address]

## 2023-07-25 NOTE — Patient Instructions (Signed)
 Visit Information  Thank you for taking time to visit with me today. Please don't hesitate to contact me if I can be of assistance to you before our next scheduled telephone appointment.  Following are the goals we discussed today:  Notify cardiology or seek emergency medical attention if you start having chest pain or discomfort. Monitor blood sugar, blood pressure, heart rate, and weight daily.   Our next appointment is by telephone on 4/29  Please call the care guide team at [phone] if you need to cancel or reschedule your appointment.   Please call the Suicide and Crisis Lifeline: 988 call the [place]  National Suicide Prevention Lifeline: 484-751-6300 or TTY: (867)002-1796 TTY 236-266-3846) to talk to a trained counselor call 1-800-273-TALK (toll free, 24 hour hotline) call 911 if you are experiencing a Mental Health or Behavioral Health Crisis or need someone to talk to.  The patient verbalized understanding of instructions, educational materials, and care plan provided today and DECLINED offer to receive copy of patient instructions, educational materials, and care plan.   The patient has been provided with contact information for the care management team and has been advised to call with any health related questions or concerns.   Odella Ku, RN, MSN, CCM [facility], [facility] RN Care Coordinator Direct Dial: [phone] / Main [phone] Fax [phone] Email: [email or web address] Website: [email or web address]

## 2023-08-19 DIAGNOSIS — E7800 Pure hypercholesterolemia, unspecified: Secondary | ICD-10-CM | POA: Diagnosis not present

## 2023-08-19 DIAGNOSIS — E1122 Type 2 diabetes mellitus with diabetic chronic kidney disease: Secondary | ICD-10-CM | POA: Diagnosis not present

## 2023-08-19 DIAGNOSIS — I10 Essential (primary) hypertension: Secondary | ICD-10-CM | POA: Diagnosis not present

## 2023-08-19 DIAGNOSIS — Z794 Long term (current) use of insulin: Secondary | ICD-10-CM | POA: Diagnosis not present

## 2023-08-19 DIAGNOSIS — N1831 Chronic kidney disease, stage 3a: Secondary | ICD-10-CM | POA: Diagnosis not present

## 2023-08-23 DIAGNOSIS — G4733 Obstructive sleep apnea (adult) (pediatric): Secondary | ICD-10-CM | POA: Diagnosis not present

## 2023-08-23 DIAGNOSIS — I10 Essential (primary) hypertension: Secondary | ICD-10-CM | POA: Diagnosis not present

## 2023-08-23 DIAGNOSIS — Z8673 Personal history of transient ischemic attack (TIA), and cerebral infarction without residual deficits: Secondary | ICD-10-CM | POA: Diagnosis not present

## 2023-08-26 ENCOUNTER — Other Ambulatory Visit: Payer: Self-pay

## 2023-08-26 ENCOUNTER — Other Ambulatory Visit: Payer: Self-pay | Admitting: Physician Assistant

## 2023-08-26 ENCOUNTER — Emergency Department
Admission: EM | Admit: 2023-08-26 | Discharge: 2023-08-26 | Discharge status: Against Medical Advice | Attending: Emergency Medicine | Admitting: Emergency Medicine

## 2023-08-26 DIAGNOSIS — E113293 Type 2 diabetes mellitus with mild nonproliferative diabetic retinopathy without macular edema, bilateral: Secondary | ICD-10-CM | POA: Diagnosis not present

## 2023-08-26 DIAGNOSIS — E1169 Type 2 diabetes mellitus with other specified complication: Secondary | ICD-10-CM | POA: Diagnosis not present

## 2023-08-26 DIAGNOSIS — Z794 Long term (current) use of insulin: Secondary | ICD-10-CM | POA: Diagnosis not present

## 2023-08-26 DIAGNOSIS — Z1231 Encounter for screening mammogram for malignant neoplasm of breast: Secondary | ICD-10-CM | POA: Diagnosis not present

## 2023-08-26 DIAGNOSIS — R509 Fever, unspecified: Secondary | ICD-10-CM | POA: Diagnosis not present

## 2023-08-26 DIAGNOSIS — N390 Urinary tract infection, site not specified: Secondary | ICD-10-CM | POA: Insufficient documentation

## 2023-08-26 DIAGNOSIS — E7800 Pure hypercholesterolemia, unspecified: Secondary | ICD-10-CM | POA: Diagnosis not present

## 2023-08-26 DIAGNOSIS — N1832 Chronic kidney disease, stage 3b: Secondary | ICD-10-CM | POA: Diagnosis not present

## 2023-08-26 DIAGNOSIS — E1129 Type 2 diabetes mellitus with other diabetic kidney complication: Secondary | ICD-10-CM | POA: Diagnosis not present

## 2023-08-26 DIAGNOSIS — M545 Low back pain, unspecified: Secondary | ICD-10-CM | POA: Diagnosis not present

## 2023-08-26 DIAGNOSIS — E1165 Type 2 diabetes mellitus with hyperglycemia: Secondary | ICD-10-CM | POA: Diagnosis not present

## 2023-08-26 DIAGNOSIS — Z0000 Encounter for general adult medical examination without abnormal findings: Secondary | ICD-10-CM | POA: Diagnosis not present

## 2023-08-26 DIAGNOSIS — Z5321 Procedure and treatment not carried out due to patient leaving prior to being seen by health care provider: Secondary | ICD-10-CM | POA: Diagnosis not present

## 2023-08-26 DIAGNOSIS — N1831 Chronic kidney disease, stage 3a: Secondary | ICD-10-CM | POA: Diagnosis not present

## 2023-08-26 DIAGNOSIS — E11610 Type 2 diabetes mellitus with diabetic neuropathic arthropathy: Secondary | ICD-10-CM | POA: Diagnosis not present

## 2023-08-26 DIAGNOSIS — I10 Essential (primary) hypertension: Secondary | ICD-10-CM | POA: Diagnosis not present

## 2023-08-26 DIAGNOSIS — N3946 Mixed incontinence: Secondary | ICD-10-CM | POA: Diagnosis not present

## 2023-08-26 DIAGNOSIS — E785 Hyperlipidemia, unspecified: Secondary | ICD-10-CM | POA: Diagnosis not present

## 2023-08-26 DIAGNOSIS — I2510 Atherosclerotic heart disease of native coronary artery without angina pectoris: Secondary | ICD-10-CM | POA: Diagnosis not present

## 2023-08-26 DIAGNOSIS — R809 Proteinuria, unspecified: Secondary | ICD-10-CM | POA: Diagnosis not present

## 2023-08-26 DIAGNOSIS — G4733 Obstructive sleep apnea (adult) (pediatric): Secondary | ICD-10-CM | POA: Diagnosis not present

## 2023-08-26 DIAGNOSIS — E1142 Type 2 diabetes mellitus with diabetic polyneuropathy: Secondary | ICD-10-CM | POA: Diagnosis not present

## 2023-08-26 DIAGNOSIS — E1122 Type 2 diabetes mellitus with diabetic chronic kidney disease: Secondary | ICD-10-CM | POA: Diagnosis not present

## 2023-08-26 LAB — URINALYSIS, ROUTINE W REFLEX MICROSCOPIC
Bilirubin Urine: NEGATIVE
Glucose, UA: NEGATIVE mg/dL
Ketones, ur: NEGATIVE mg/dL
Nitrite: NEGATIVE
Protein, ur: 30 mg/dL — AB
Specific Gravity, Urine: 1.014 (ref 1.005–1.030)
WBC, UA: >50 WBC/hpf (ref 0–5)
pH: 5 (ref 5.0–8.0)

## 2023-08-26 LAB — CBC WITH DIFFERENTIAL/PLATELET
Abs Immature Granulocytes: 0.04 10*3/uL (ref 0.00–0.07)
Basophils Absolute: 0.1 10*3/uL (ref 0.0–0.1)
Basophils Relative: 1 %
Eosinophils Absolute: 0.1 10*3/uL (ref 0.0–0.5)
Eosinophils Relative: 1 %
HCT: 37 % (ref 36.0–46.0)
Hemoglobin: 12 g/dL (ref 12.0–15.0)
Immature Granulocytes: 0 %
Lymphocytes Relative: 11 %
Lymphs Abs: 1.2 10*3/uL (ref 0.7–4.0)
MCH: 30.5 pg (ref 26.0–34.0)
MCHC: 32.4 g/dL (ref 30.0–36.0)
MCV: 93.9 fL (ref 80.0–100.0)
Monocytes Absolute: 0.9 10*3/uL (ref 0.1–1.0)
Monocytes Relative: 8 %
Neutro Abs: 8.4 10*3/uL — ABNORMAL HIGH (ref 1.7–7.7)
Neutrophils Relative %: 79 %
Platelets: 287 10*3/uL (ref 150–400)
RBC: 3.94 MIL/uL (ref 3.87–5.11)
RDW: 14.1 % (ref 11.5–15.5)
WBC: 10.7 10*3/uL — ABNORMAL HIGH (ref 4.0–10.5)
nRBC: 0 % (ref 0.0–0.2)

## 2023-08-26 LAB — BASIC METABOLIC PANEL
Anion gap: 13 (ref 5–15)
BUN: 38 mg/dL — ABNORMAL HIGH (ref 8–23)
CO2: 24 mmol/L (ref 22–32)
Calcium: 9.1 mg/dL (ref 8.9–10.3)
Chloride: 102 mmol/L (ref 98–111)
Creatinine, Ser: 1.58 mg/dL — ABNORMAL HIGH (ref 0.44–1.00)
GFR, Estimated: 37 mL/min — ABNORMAL LOW (ref >60)
Glucose, Bld: 182 mg/dL — ABNORMAL HIGH (ref 70–99)
Potassium: 4.2 mmol/L (ref 3.5–5.1)
Sodium: 139 mmol/L (ref 135–145)

## 2023-08-26 NOTE — ED Triage Notes (Signed)
 Pt states she was dx with uti today while at her PCP and prescribed abx. Pt states she has also had back pain and fever, pt states she told her PCP about this while there today. Pt took tylenol at 1830 pta.

## 2023-08-26 NOTE — ED Provider Triage Note (Signed)
 Emergency Medicine Provider Triage Evaluation Note  Julia Simmons , a 63 y.o. female  was evaluated in triage.  Pt complains of UTI, back pain and fever.  Patient was diagnosed with UTI, started on antibiotics.    Review of Systems  Positive: Flank pain, dysuria, known UTI Negative: Emesis, diarrhea  Physical Exam  BP 133/62   Pulse 97   Temp 98.6 F (37 C) (Oral)   Resp 18   Ht 5\' 5"  (1.651 m)   Wt (!) 140.6 kg   SpO2 95%   BMI 51.59 kg/m  Gen:   Awake, no distress  Resp:  Normal effort  MSK:   Moves extremities without difficulty  Other:    Medical Decision Making  Medically screening exam initiated at 7:41 PM.  Appropriate orders placed.  SAJE GALLOP was informed that the remainder of the evaluation will be completed by another provider, this initial triage assessment does not replace that evaluation, and the importance of remaining in the ED until their evaluation is complete.  Urinalysis and basic labs   Lanette Hampshire 08/26/23 1941

## 2023-10-15 ENCOUNTER — Ambulatory Visit: Payer: PPO | Admitting: *Deleted

## 2023-10-15 ENCOUNTER — Other Ambulatory Visit: Payer: Self-pay

## 2023-10-15 NOTE — Patient Instructions (Signed)
 Visit Information  Thank you for taking time to visit with me today. Please don't hesitate to contact me if I can be of assistance to you before our next scheduled appointment.  Our next appointment is by telephone on 5/27 at 10am  Please call the care guide team at [phone] if you need to cancel or reschedule your appointment.   Following is a copy of your care plan:   Goals Addressed             This Visit's Progress    COMPLETED: Effective management of chronic medical conditions (CAD/DM)   On track    Interventions Today    Flowsheet Row Most Recent Value  Chronic Disease   Chronic disease during today's visit Diabetes, Congestive Heart Failure (CHF), Other, Hypertension (HTN)  [CAD]  General Interventions   General Interventions Discussed/Reviewed General Interventions Reviewed, Doctor Visits  Doctor Visits Discussed/Reviewed Doctor Visits Reviewed, Specialist, PCP  Carin Charleston upcoming: endocrinology 2/20, PCP 3/10, cardiology 4/25.  Has appointment on schedule for endocrine team at [facility], she will call to cancel, insurance does not cover [facility]]  PCP/Specialist Visits Compliance with follow-up visit  [Reviewed cardiac surgical consult, will treat medically, no surgical intervention planned at this time]  Exercise Interventions   Exercise Discussed/Reviewed Weight Managment  Weight Management Weight maintenance  Education Interventions   Education Provided Provided Education  Provided Verbal Education On Labs, Blood Sugar Monitoring, Medication, When to see the doctor, Nutrition  [meds reviewed, no change after most recent office visit. Will continue to take and will monitor blood sugar, BP, HR for daily trends]  Labs Reviewed Hgb A1c  [Current A1C is 8.3, aware of goal less than 7]  Nutrition Interventions   Nutrition Discussed/Reviewed Nutrition Reviewed, Adding fruits and vegetables, Decreasing fats, Decreasing salt, Decreasing sugar intake      Update 4/29 - goal  ongoing, transitioned to new goal heading     VBCI RN Care Plan       Problems:  Chronic Disease Management support and education needs related to CAD and DMII  Goal: Over the next 60 days the Patient will attend all scheduled medical appointments: Cardiology, and Endocrinology as evidenced by documented completion in EMR        continue to work with RN Care Manager and/or Social Worker to address care management and care coordination needs related to CAD and DMII as evidenced by adherence to care management team scheduled appointments     experience decrease in ED visits as evidenced by electronic medical record review; ED visits in last 6 months = 1 take all medications exactly as prescribed and will call provider for medication related questions as evidenced by patient reported compliance    work with endocrinology to decrease A1C to goal as evidenced by review of electronic medical record and patient or care team member report    Interventions:   CAD Interventions: Assessed understanding of CAD diagnosis Medications reviewed including medications utilized in CAD treatment plan Provided education on Importance of limiting foods high in cholesterol Counseled on the importance of exercise goals with target of 150 minutes per week Reviewed Importance of taking all medications as prescribed Screening for signs and symptoms of depression related to chronic disease state Assessed social determinant of health barriers   Diabetes Interventions: Assessed patient's understanding of A1c goal: <7% Provided education to patient about basic DM disease process Reviewed medications with patient and discussed importance of medication adherence Counseled on importance of regular laboratory monitoring as prescribed  Discussed plans with patient for ongoing care management follow up and provided patient with direct contact information for care management team Advised patient, providing education and  rationale, to check cbg 3 times a day and record, calling Dr. Lorelei Rogers for findings outside established parameters Review of patient status, including review of consultants reports, relevant laboratory and other test results, and medications completed Lab Results  Component Value Date   HGBA1C 8.3 (H) 03/20/2023    Patient Self-Care Activities:  Attend all scheduled provider appointments Call provider office for new concerns or questions  Perform all self care activities independently  Take medications as prescribed   schedule appointment with eye doctor check feet daily for cuts, sores or redness enter blood sugar readings and medication or insulin  into daily log set goal weight manage portion size  Plan:  The patient has been provided with contact information for the care management team and has been advised to call with any health related questions or concerns.              Please call the Suicide and Crisis Lifeline: 988 call the [place]  National Suicide Prevention Lifeline: 971-202-2892 or TTY: 7095195725 TTY 458-721-3836) to talk to a trained counselor call 1-800-273-TALK (toll free, 24 hour hotline) call 911 if you are experiencing a Mental Health or Behavioral Health Crisis or need someone to talk to.  The patient verbalized understanding of instructions, educational materials, and care plan provided today and DECLINED offer to receive copy of patient instructions, educational materials, and care plan.   Holland Lundborg, RN, MSN, CCM [facility], [facility] RN Care Coordinator Direct Dial: [phone] / Main [phone] Fax [phone] Email: [email or web address]: [email or web address]

## 2023-10-15 NOTE — Patient Outreach (Signed)
 Complex Care Management   Visit Note  10/15/2023  Name:  Julia Simmons MRN: [id number] DOB: 1960-11-05  Situation: Referral received for Complex Care Management related to Diabetes with Complications and CAD  I obtained verbal consent from Patient.  Visit completed with patient  on the phone  Patient was seen in ED on 3/10 for UTI, state she has had frequent ones in the past, not an issue at this time.  Denies any urgent concerns, encouraged to contact this care manager with questions.    Background:   Past Medical History:  Diagnosis Date   Charcot's joint of foot    Coronary artery disease    Diabetes mellitus with neuropathy (HCC)    Diabetic retinopathy (HCC)    Hypertension    Morbid obesity with BMI of 50.0-59.9, adult (HCC)    Sleep apnea    Stroke [facility])     Assessment: Patient Reported Symptoms:  Cognitive Cognitive Status: Alert and oriented to person, place, and time, Normal speech and language skills Cognitive/Intellectual Conditions Management [RPT]: None reported or documented in medical history or problem list   Health Maintenance Behaviors: Annual physical exam, Exercise, Healthy diet  Neurological Neurological Review of Symptoms: No symptoms reported    HEENT HEENT Symptoms Reported: No symptoms reported      Cardiovascular Cardiovascular Symptoms Reported: No symptoms reported    Respiratory Respiratory Symptoms Reported: No symptoms reported    Endocrine Patient reports the following symptoms related to hypoglycemia or hyperglycemia : No symptoms reported Is patient diabetic?: Yes Is patient checking blood sugars at home?: Yes Endocrine Conditions: Diabetes Endocrine Management Strategies: Medication therapy, Exercise, Diet modification, Routine screening Endocrine Self-Management Outcome: 4 (good)  Gastrointestinal Gastrointestinal Symptoms Reported: No symptoms reported   Nutrition Risk Screen (CP): No indicators present  Genitourinary  Genitourinary Symptoms Reported: Incontinence Genitourinary Conditions: Incontinence Genitourinary Management Strategies: Incontinence garment/pad, Medication therapy  Integumentary Integumentary Symptoms Reported: No symptoms reported    Musculoskeletal Musculoskelatal Symptoms Reviewed: Difficulty walking Additional Musculoskeletal Details: charcot foot        Psychosocial       Quality of Family Relationships: involved, helpful Do you feel physically threatened by others?: No      10/15/2023   10:35 AM  Depression screen PHQ 2/9  Decreased Interest 0  Down, Depressed, Hopeless 0  PHQ - 2 Score 0    There were no vitals filed for this visit.  Medications Reviewed Today     Reviewed by [place] Lundborg, RN (Registered Nurse) on 10/15/23 at 1030  Med List Status: <None>   Medication Order Taking? Sig Documenting Provider Last Dose Status Informant  albuterol  (VENTOLIN  HFA) 108 (90 Base) MCG/ACT inhaler [id number] Yes Inhale into the lungs. [provider] Taking Active Self  aspirin  EC 81 MG tablet [id number]  Take 81 mg by mouth daily. [provider]  Active Self  atorvastatin  (LIPITOR ) 80 MG tablet [id number] Yes Take 1 tablet (80 mg total) by mouth daily. Lorita Rosa, MD Taking Active   budesonide  (PULMICORT ) 0.5 MG/2ML nebulizer solution [id number]  Take 2 mLs (0.5 mg total) by nebulization every 12 (twelve) hours. Lorita Rosa, MD  Expired 04/26/23 2359   clopidogrel  (PLAVIX ) 75 MG tablet [id number] Yes Take 75 mg by mouth daily. [provider] Taking Active Self  Continuous Blood Gluc Sensor (FREESTYLE LIBRE SENSOR SYSTEM) MISC [id number] Yes Use 1 kit every 14 (fourteen) days for glucose monitoring [provider] Taking Active Self  furosemide  (LASIX ) 40 MG tablet [id number] Yes  Take 1 tablet (40 mg total) by mouth 2 (two) times daily. Lorita Rosa, MD Taking Active   Insulin  Aspart FlexPen (NOVOLOG ) 100 UNIT/ML [id number]  Inject  10 Units into the skin 3 (three) times daily before meals. Before lunch and dinner Lorita Rosa, MD  Expired 04/26/23 2359   insulin  glargine (LANTUS ) 100 UNIT/ML injection [id number] Yes Inject 100 Units into the skin at bedtime. [provider] Taking Active Self           Med Note Aaron Aas, Kellan Boehlke   Tue Oct 15, 2023 10:29 AM) 30 units at bedtime   linagliptin  (TRADJENTA ) 5 MG TABS tablet [id number] No Take 1 tablet (5 mg total) by mouth daily.  Patient not taking: Reported on 10/15/2023   Lorita Rosa, MD Not Taking Active   metFORMIN (GLUCOPHAGE) 500 MG tablet [id number] Yes Take 1,000 mg by mouth daily with breakfast. [provider] Taking Active Self  metFORMIN (GLUCOPHAGE) 500 MG tablet [id number] Yes Take 1,000 mg by mouth daily with supper. [provider] Taking Active Self  metoprolol  succinate (TOPROL -XL) 25 MG 24 hr tablet [id number] Yes Take 1 tablet (25 mg total) by mouth 2 (two) times daily. Lorita Rosa, MD Taking Active   oxybutynin  (DITROPAN -XL) 10 MG 24 hr tablet [id number] Yes Take 1 tablet by mouth daily. [provider] Taking Active Self  sacubitril -valsartan  (ENTRESTO ) 49-51 MG [id number] Yes Take 1 tablet by mouth 2 (two) times daily. Lorita Rosa, MD Taking Active   spironolactone  (ALDACTONE ) 25 MG tablet [id number] Yes Take by mouth. [provider] Taking Active Self            Recommendation:   Specialty provider follow-up cardiology on 5/19, Endocrinology on 6/16  Follow Up Plan:   Telephone follow up appointment date/time:  5/27 at 10am  Holland Lundborg, RN, MSN, CCM [facility], [facility] Health RN Care Coordinator Direct Dial: [phone] / Main [phone] Fax [phone] Email: [email or web address]

## 2023-11-01 ENCOUNTER — Ambulatory Visit
Admission: RE | Admit: 2023-11-01 | Discharge: 2023-11-01 | Disposition: A | Discharge status: Home / Self Care | Source: Ambulatory Visit | Attending: Physician Assistant | Admitting: Physician Assistant

## 2023-11-01 DIAGNOSIS — Z1231 Encounter for screening mammogram for malignant neoplasm of breast: Secondary | ICD-10-CM | POA: Diagnosis not present

## 2023-11-04 DIAGNOSIS — I10 Essential (primary) hypertension: Secondary | ICD-10-CM | POA: Diagnosis not present

## 2023-11-04 DIAGNOSIS — I255 Ischemic cardiomyopathy: Secondary | ICD-10-CM | POA: Diagnosis not present

## 2023-11-04 DIAGNOSIS — I5021 Acute systolic (congestive) heart failure: Secondary | ICD-10-CM | POA: Diagnosis not present

## 2023-11-04 DIAGNOSIS — E782 Mixed hyperlipidemia: Secondary | ICD-10-CM | POA: Diagnosis not present

## 2023-11-04 DIAGNOSIS — I63511 Cerebral infarction due to unspecified occlusion or stenosis of right middle cerebral artery: Secondary | ICD-10-CM | POA: Diagnosis not present

## 2023-11-04 DIAGNOSIS — E119 Type 2 diabetes mellitus without complications: Secondary | ICD-10-CM | POA: Diagnosis not present

## 2023-11-04 DIAGNOSIS — Z794 Long term (current) use of insulin: Secondary | ICD-10-CM | POA: Diagnosis not present

## 2023-11-04 DIAGNOSIS — I2510 Atherosclerotic heart disease of native coronary artery without angina pectoris: Secondary | ICD-10-CM | POA: Diagnosis not present

## 2023-11-04 DIAGNOSIS — Z955 Presence of coronary angioplasty implant and graft: Secondary | ICD-10-CM | POA: Diagnosis not present

## 2023-11-04 DIAGNOSIS — E113293 Type 2 diabetes mellitus with mild nonproliferative diabetic retinopathy without macular edema, bilateral: Secondary | ICD-10-CM | POA: Diagnosis not present

## 2023-11-12 ENCOUNTER — Other Ambulatory Visit: Payer: Self-pay | Admitting: *Deleted

## 2023-11-12 NOTE — Patient Outreach (Signed)
 Complex Care Management   Visit Note  11/12/2023  Name:  Julia Simmons MRN: [id number] DOB: 05/29/1961  Situation: Referral received for Complex Care Management related to Diabetes with Complications and CAD I obtained verbal consent from Patient.  Visit completed with patient  on the phone  Patient report blood sugars have continued to fluctuate despite medication changes with endocrinology team in March.  She has a follow up next month, will discuss potential changes as she is getting injections 3 times a day.  State they were going to try Mounjaro, but the insurance would not approve it.  Offered to have the pharmacy team review case for suggestions, request to wait until after next appointment/A1C check.   Background:   Past Medical History:  Diagnosis Date   Charcot's joint of foot    Coronary artery disease    Diabetes mellitus with neuropathy (HCC)    Diabetic retinopathy (HCC)    Hypertension    Morbid obesity with BMI of 50.0-59.9, adult (HCC)    Sleep apnea    Stroke [facility])     Assessment: Patient Reported Symptoms:  Cognitive Cognitive Status: Alert and oriented to person, place, and time, Normal speech and language skills, Able to follow simple commands Cognitive/Intellectual Conditions Management [RPT]: None reported or documented in medical history or problem list      Neurological Neurological Review of Symptoms: No symptoms reported    HEENT HEENT Symptoms Reported: No symptoms reported      Cardiovascular Cardiovascular Symptoms Reported: No symptoms reported    Respiratory Respiratory Symptoms Reported: No symptoms reported    Endocrine Patient reports the following symptoms related to hypoglycemia or hyperglycemia : No symptoms reported Is patient diabetic?: Yes Is patient checking blood sugars at home?: Yes Endocrine Conditions: Diabetes Endocrine Management Strategies: Medication therapy, Weight management, Routine screening Endocrine Self-Management  Outcome: 4 (good)  Gastrointestinal Gastrointestinal Symptoms Reported: No symptoms reported      Genitourinary Genitourinary Symptoms Reported: No symptoms reported    Integumentary Integumentary Symptoms Reported: No symptoms reported    Musculoskeletal Musculoskelatal Symptoms Reviewed: No symptoms reported        Psychosocial Psychosocial Symptoms Reported: No symptoms reported            10/15/2023   10:35 AM  Depression screen PHQ 2/9  Decreased Interest 0  Down, Depressed, Hopeless 0  PHQ - 2 Score 0    There were no vitals filed for this visit.  Medications Reviewed Today     Reviewed by [place] Lundborg, RN (Registered Nurse) on 11/12/23 at 1050  Med List Status: <None>   Medication Order Taking? Sig Documenting Provider Last Dose Status Informant  albuterol  (VENTOLIN  HFA) 108 (90 Base) MCG/ACT inhaler [id number] No Inhale into the lungs. [provider] Taking Active Self  aspirin  EC 81 MG tablet [id number] No Take 81 mg by mouth daily. [provider] Taking Active Self  atorvastatin  (LIPITOR ) 80 MG tablet [id number] No Take 1 tablet (80 mg total) by mouth daily. Lorita Rosa, MD Taking Active   budesonide  (PULMICORT ) 0.5 MG/2ML nebulizer solution [id number]  Take 2 mLs (0.5 mg total) by nebulization every 12 (twelve) hours. Lorita Rosa, MD  Expired 04/26/23 2359   clopidogrel  (PLAVIX ) 75 MG tablet [id number] No Take 75 mg by mouth daily. [provider] Taking Active Self  Continuous Blood Gluc Sensor (FREESTYLE LIBRE SENSOR SYSTEM) MISC [id number] No Use 1 kit every 14 (fourteen) days for glucose monitoring [provider] Taking Active Self  furosemide  (  LASIX ) 40 MG tablet [id number] No Take 1 tablet (40 mg total) by mouth 2 (two) times daily. Lorita Rosa, MD Taking Active   Insulin  Aspart FlexPen (NOVOLOG ) 100 UNIT/ML [id number]  Inject 10 Units into the skin 3 (three) times daily before meals. Before lunch and dinner Lorita Rosa, MD  Expired 04/26/23 2359   insulin  glargine (LANTUS ) 100 UNIT/ML injection [id number] No Inject 100 Units into the skin at bedtime. [provider] Taking Active Self           Med Note Aaron Aas, Sire Poet   Tue Oct 15, 2023 10:29 AM) 30 units at bedtime   linagliptin  (TRADJENTA ) 5 MG TABS tablet [id number] No Take 1 tablet (5 mg total) by mouth daily.  Patient not taking: Reported on 10/15/2023   Lorita Rosa, MD Not Taking Active   metFORMIN (GLUCOPHAGE) 500 MG tablet [id number] No Take 1,000 mg by mouth daily with breakfast. [provider] Taking Active Self  metFORMIN (GLUCOPHAGE) 500 MG tablet [id number] No Take 1,000 mg by mouth daily with supper. [provider] Taking Active Self  metoprolol  succinate (TOPROL -XL) 25 MG 24 hr tablet [id number] No Take 1 tablet (25 mg total) by mouth 2 (two) times daily. Lorita Rosa, MD Taking Active   oxybutynin  (DITROPAN -XL) 10 MG 24 hr tablet [id number] No Take 1 tablet by mouth daily. [provider] Taking Active Self  sacubitril -valsartan  (ENTRESTO ) 49-51 MG [id number] No Take 1 tablet by mouth 2 (two) times daily. Lorita Rosa, MD Taking Active   spironolactone  (ALDACTONE ) 25 MG tablet [id number] No Take by mouth. [provider] Taking Active Self            Recommendation:   Specialty provider follow-up Endocrine 6/16  Follow Up Plan:   Telephone follow up appointment date/time:  6/24 at 10am with P. Slusher  Holland Lundborg, RN, MSN, CCM [place]  [facility], [facility] Health RN Care Coordinator Direct Dial: [phone] / Main [phone] Fax [phone] Email: [email or web address]

## 2023-11-12 NOTE — Patient Instructions (Signed)
 Visit Information  Thank you for taking time to visit with me today. Please don't hesitate to contact me if I can be of assistance to you before our next scheduled appointment.  Telephone follow up appointment date/time:  6/24 at 10am with P. Slusher, RNCM  Please call the care guide team at [phone] if you need to cancel, schedule, or reschedule an appointment.   Please call the Suicide and Crisis Lifeline: 988 call the [place]  National Suicide Prevention Lifeline: (224)751-8786 or TTY: (226)652-8322 TTY 872-043-0258) to talk to a trained counselor call 1-800-273-TALK (toll free, 24 hour hotline) call 911 if you are experiencing a Mental Health or Behavioral Health Crisis or need someone to talk to.  Holland Lundborg, RN, MSN, CCM [facility], [facility] Health RN Care Coordinator Direct Dial: [phone] / Main [phone] Fax [phone] Email: [email or web address]

## 2023-11-21 DIAGNOSIS — Z8673 Personal history of transient ischemic attack (TIA), and cerebral infarction without residual deficits: Secondary | ICD-10-CM | POA: Diagnosis not present

## 2023-11-21 DIAGNOSIS — G4733 Obstructive sleep apnea (adult) (pediatric): Secondary | ICD-10-CM | POA: Diagnosis not present

## 2023-11-21 DIAGNOSIS — I10 Essential (primary) hypertension: Secondary | ICD-10-CM | POA: Diagnosis not present

## 2023-11-28 DIAGNOSIS — R8290 Unspecified abnormal findings in urine: Secondary | ICD-10-CM | POA: Diagnosis not present

## 2023-11-28 DIAGNOSIS — N3000 Acute cystitis without hematuria: Secondary | ICD-10-CM | POA: Diagnosis not present

## 2023-11-28 DIAGNOSIS — L97511 Non-pressure chronic ulcer of other part of right foot limited to breakdown of skin: Secondary | ICD-10-CM | POA: Diagnosis not present

## 2023-12-02 DIAGNOSIS — E66813 Obesity, class 3: Secondary | ICD-10-CM | POA: Diagnosis not present

## 2023-12-02 DIAGNOSIS — E1122 Type 2 diabetes mellitus with diabetic chronic kidney disease: Secondary | ICD-10-CM | POA: Diagnosis not present

## 2023-12-02 DIAGNOSIS — E1169 Type 2 diabetes mellitus with other specified complication: Secondary | ICD-10-CM | POA: Diagnosis not present

## 2023-12-02 DIAGNOSIS — E113293 Type 2 diabetes mellitus with mild nonproliferative diabetic retinopathy without macular edema, bilateral: Secondary | ICD-10-CM | POA: Diagnosis not present

## 2023-12-02 DIAGNOSIS — E1142 Type 2 diabetes mellitus with diabetic polyneuropathy: Secondary | ICD-10-CM | POA: Diagnosis not present

## 2023-12-02 DIAGNOSIS — Z794 Long term (current) use of insulin: Secondary | ICD-10-CM | POA: Diagnosis not present

## 2023-12-02 DIAGNOSIS — E785 Hyperlipidemia, unspecified: Secondary | ICD-10-CM | POA: Diagnosis not present

## 2023-12-02 DIAGNOSIS — E1159 Type 2 diabetes mellitus with other circulatory complications: Secondary | ICD-10-CM | POA: Diagnosis not present

## 2023-12-02 DIAGNOSIS — E1165 Type 2 diabetes mellitus with hyperglycemia: Secondary | ICD-10-CM | POA: Diagnosis not present

## 2023-12-02 DIAGNOSIS — N1832 Chronic kidney disease, stage 3b: Secondary | ICD-10-CM | POA: Diagnosis not present

## 2023-12-02 DIAGNOSIS — E11610 Type 2 diabetes mellitus with diabetic neuropathic arthropathy: Secondary | ICD-10-CM | POA: Diagnosis not present

## 2023-12-10 ENCOUNTER — Other Ambulatory Visit: Payer: Self-pay

## 2023-12-10 NOTE — Patient Outreach (Signed)
 Complex Care Management   Visit Note  12/10/2023  Name:  Julia Simmons MRN: [id number] DOB: [birth year]  Situation: Referral received for Complex Care Management related to Diabetes, CAD I obtained verbal consent from Patient.  Visit completed with Julia Simmons  on the phone  Background:   Past Medical History:  Diagnosis Date   Charcot's joint of foot    Coronary artery disease    Diabetes mellitus with neuropathy (HCC)    Diabetic retinopathy (HCC)    Hypertension    Morbid obesity with BMI of 50.0-59.9, adult (HCC)    Sleep apnea    Stroke [facility])     Assessment: Patient Reported Symptoms:  Cognitive Cognitive Status: Alert and oriented to person, place, and time, Normal speech and language skills      Neurological Neurological Review of Symptoms: No symptoms reported    HEENT HEENT Symptoms Reported: No symptoms reported      Cardiovascular Cardiovascular Symptoms Reported: No symptoms reported    Respiratory Respiratory Symptoms Reported: No symptoms reported    Endocrine Patient reports the following symptoms related to hypoglycemia or hyperglycemia : No symptoms reported (A1C 8.4% 12/02/23, 8.4% 08/19/2023, 8.3% 03/20/23, 9.3% 06/13/2020) Is patient diabetic?: Yes Is patient checking blood sugars at home?: Yes (Uses Freestyle Libre monitor and sensors.) Endocrine Conditions: Diabetes  Gastrointestinal Gastrointestinal Symptoms Reported: No symptoms reported      Genitourinary Genitourinary Symptoms Reported: No symptoms reported Additional Genitourinary Details: Denies odorous urine, completed antibiotics for UTI that were prescribed 11/28/23 by PCP.    Integumentary Integumentary Symptoms Reported: No symptoms reported    Musculoskeletal Musculoskelatal Symptoms Reviewed: No symptoms reported        Psychosocial Psychosocial Symptoms Reported: No symptoms reported            10/15/2023   10:35 AM  Depression screen PHQ 2/9  Decreased Interest 0  Down,  Depressed, Hopeless 0  PHQ - 2 Score 0    There were no vitals filed for this visit.  Medications Reviewed Today     Reviewed by Lucian Santana LABOR, RN (Registered Nurse) on 12/10/23 at 1024  Med List Status: <None>   Medication Order Taking? Sig Documenting Provider Last Dose Status Informant  albuterol  (VENTOLIN  HFA) 108 (90 Base) MCG/ACT inhaler [id number]  Inhale into the lungs. [provider]  Active Self  aspirin  EC 81 MG tablet [id number]  Take 81 mg by mouth daily. [provider]  Active Self  atorvastatin  (LIPITOR ) 80 MG tablet [id number]  Take 1 tablet (80 mg total) by mouth daily. Barbarann Nest, MD  Active   budesonide  (PULMICORT ) 0.5 MG/2ML nebulizer solution [id number]  Take 2 mLs (0.5 mg total) by nebulization every 12 (twelve) hours. Barbarann Nest, MD  Expired 04/26/23 2359   clopidogrel  (PLAVIX ) 75 MG tablet [id number]  Take 75 mg by mouth daily. [provider]  Active Self  Continuous Blood Gluc Sensor (FREESTYLE LIBRE SENSOR SYSTEM) MISC [id number]  Use 1 kit every 14 (fourteen) days for glucose monitoring [provider]  Active Self  furosemide  (LASIX ) 40 MG tablet [id number]  Take 1 tablet (40 mg total) by mouth 2 (two) times daily. Barbarann Nest, MD  Active   Insulin  Aspart FlexPen (NOVOLOG ) 100 UNIT/ML [id number]  Inject 10 Units into the skin 3 (three) times daily before meals. Before lunch and dinner Barbarann Nest, MD  Expired 04/26/23 2359   insulin  glargine (LANTUS ) 100 UNIT/ML injection [id number] Yes Inject 100 Units into the skin at bedtime. [provider]  Active Self           Med Note (EVERETTE, MONICA   Tue Oct 15, 2023 10:29 AM) 30 units at bedtime   linagliptin  (TRADJENTA ) 5 MG TABS tablet [id number]  Take 1 tablet (5 mg total) by mouth daily.  Patient not taking: Reported on 10/15/2023   Barbarann Nest, MD  Active   metFORMIN (GLUCOPHAGE) 500 MG tablet [id number] Yes Take 1,000 mg by mouth daily with breakfast.  [provider]  Active Self  metFORMIN (GLUCOPHAGE) 500 MG tablet [id number]  Take 1,000 mg by mouth daily with supper. [provider]  Active Self  metoprolol  succinate (TOPROL -XL) 25 MG 24 hr tablet [id number]  Take 1 tablet (25 mg total) by mouth 2 (two) times daily. Barbarann Nest, MD  Active   oxybutynin  (DITROPAN -XL) 10 MG 24 hr tablet [id number]  Take 1 tablet by mouth daily. [provider]  Active Self  sacubitril -valsartan  (ENTRESTO ) 49-51 MG [id number]  Take 1 tablet by mouth 2 (two) times daily. Barbarann Nest, MD  Active   spironolactone  (ALDACTONE ) 25 MG tablet [id number]  Take by mouth. [provider]  Active Self            Recommendation:   Continue Current Plan of Care -patient will take insulin  and DM medications as prescribed, she is using FreeStyle Libre to monitor blood sugars, she was not able to provide this RNCM with today's FBG.   -States husband or son will take her to upcoming Cardio appt on 02/21/24 and PCP appt 02/26/24.  -She declines education material on [employer] today, states she knows what she needs to be eating for DM control.   Follow Up Plan:   Telephone follow-up one months.  Santana Stamp BSN, CCM [place]  [facility] Population Health RN Care Manager Direct Dial: [phone]  Fax: [phone]

## 2023-12-10 NOTE — Patient Instructions (Signed)
 Visit Information  Thank you for taking time to visit with me today. Please don't hesitate to contact me if I can be of assistance to you before our next scheduled appointment.  Your next care management appointment is by telephone on Monday, July 21st at 10:00am.    Please call the care guide team at [phone] if you need to cancel, schedule, or reschedule an appointment.   A reminder to ALL patients/family/friends, please  if you are experiencing a Mental Health or Behavioral Health Crisis or need someone to talk to.  Santana Stamp BSN, CCM [place]  [facility] Population Health RN Care Manager Direct Dial: [phone]  Fax: [phone]

## 2024-01-06 ENCOUNTER — Other Ambulatory Visit: Payer: Self-pay

## 2024-01-06 NOTE — Patient Outreach (Unsigned)
 Complex Care Management   Visit Note  01/07/2024  Name:  Julia Simmons MRN: [id number] DOB: Jun 21, 1960  Situation: Referral received for Complex Care Management related to Diabetes with Complications I obtained verbal consent from Patient.  Visit completed with Julia Simmons  on the phone.  She saw Endo on 6/16, she has adjusted her insulin  as instructed. Reports FBG of  272 fasting this morning, Discussed time in target value and averages:  23% in target 12am 101, 3am 157, 6-9am 222-224, 12pm drops to 154, 3pm 188, after supper goes back up to 203.    Background:   Past Medical History:  Diagnosis Date   Charcot's joint of foot    Coronary artery disease    Diabetes mellitus with neuropathy (HCC)    Diabetic retinopathy (HCC)    Hypertension    Morbid obesity with BMI of 50.0-59.9, adult (HCC)    Sleep apnea    Stroke [facility])     Assessment: Patient Reported Symptoms:  Cognitive Cognitive Status: Alert and oriented to person, place, and time, Normal speech and language skills      Neurological Neurological Review of Symptoms: No symptoms reported    HEENT HEENT Symptoms Reported: No symptoms reported      Cardiovascular Cardiovascular Symptoms Reported: No symptoms reported    Respiratory Respiratory Symptoms Reported: No symptoms reported    Endocrine Endocrine Symptoms Reported: No symptoms reported (23% time in target, 7-day averages: 12am101, 3am 157, 6-9am 223, 12pm 154, 3pm 188, 6pm 203.) Is patient diabetic?: Yes Is patient checking blood sugars at home?: Yes List most recent blood sugar readings, include date and time of day: FBG today: 272 by FreeStyle Libre 2 reader    Gastrointestinal Gastrointestinal Symptoms Reported: No symptoms reported      Genitourinary Genitourinary Symptoms Reported: No symptoms reported    Integumentary Integumentary Symptoms Reported: No symptoms reported    Musculoskeletal Musculoskelatal Symptoms Reviewed: No symptoms reported         Psychosocial Psychosocial Symptoms Reported: Not assessed            10/15/2023   10:35 AM  Depression screen PHQ 2/9  Decreased Interest 0  Down, Depressed, Hopeless 0  PHQ - 2 Score 0    There were no vitals filed for this visit.  Medications Reviewed Today     Reviewed by Lucian Santana LABOR, RN (Registered Nurse) on 01/06/24 at 1331  Med List Status: <None>   Medication Order Taking? Sig Documenting Provider Last Dose Status Informant  albuterol  (VENTOLIN  HFA) 108 (90 Base) MCG/ACT inhaler [id number]  Inhale into the lungs. [provider]  Active Self  aspirin  EC 81 MG tablet [id number]  Take 81 mg by mouth daily. [provider]  Active Self  atorvastatin  (LIPITOR ) 80 MG tablet [id number]  Take 1 tablet (80 mg total) by mouth daily. Barbarann Nest, MD  Active   budesonide  (PULMICORT ) 0.5 MG/2ML nebulizer solution [id number]  Take 2 mLs (0.5 mg total) by nebulization every 12 (twelve) hours. Barbarann Nest, MD  Expired 04/26/23 2359   clopidogrel  (PLAVIX ) 75 MG tablet [id number]  Take 75 mg by mouth daily. [provider]  Active Self  Continuous Blood Gluc Sensor (FREESTYLE LIBRE SENSOR SYSTEM) MISC [id number] Yes Use 1 kit every 14 (fourteen) days for glucose monitoring [provider]  Active Self  furosemide  (LASIX ) 40 MG tablet [id number]  Take 1 tablet (40 mg total) by mouth 2 (two) times daily. Barbarann Nest, MD  Active   Insulin   Aspart FlexPen (NOVOLOG ) 100 UNIT/ML [id number]  Inject 10 Units into the skin 3 (three) times daily before meals. Before lunch and dinner Barbarann Nest, MD  Expired 04/26/23 2359   insulin  glargine (LANTUS ) 100 UNIT/ML injection [id number]  Inject 100 Units into the skin at bedtime. [provider]  Active Self           Med Note SHIRLENE, MONICA   Tue Oct 15, 2023 10:29 AM) 30 units at bedtime   linagliptin  (TRADJENTA ) 5 MG TABS tablet [id number]  Take 1 tablet (5 mg total) by mouth daily.   Patient not taking: Reported on 10/15/2023   Barbarann Nest, MD  Active   metFORMIN (GLUCOPHAGE) 500 MG tablet [id number] Yes Take 1,000 mg by mouth daily with breakfast. [provider]  Active Self  metFORMIN (GLUCOPHAGE) 500 MG tablet [id number]  Take 1,000 mg by mouth daily with supper. [provider]  Active Self  metoprolol  succinate (TOPROL -XL) 25 MG 24 hr tablet [id number]  Take 1 tablet (25 mg total) by mouth 2 (two) times daily. Barbarann Nest, MD  Active   oxybutynin  (DITROPAN -XL) 10 MG 24 hr tablet [id number]  Take 1 tablet by mouth daily. [provider]  Active Self  sacubitril -valsartan  (ENTRESTO ) 49-51 MG [id number]  Take 1 tablet by mouth 2 (two) times daily. Barbarann Nest, MD  Active   spironolactone  (ALDACTONE ) 25 MG tablet [id number]  Take by mouth. [provider]  Active Self            Recommendation:   Specialty provider follow-up : Endocrinology 03/09/24 to assess effects of increase to [place] insulin  60 units before breakfast, Novolog  15 units in AM and midday, Novolog  25 units at supper meal, Lantus  30 units at bedtime.   Continue Current Plan of Care   Follow Up Plan:   Telephone follow-up two weeks  Santana Stamp BSN, CCM [place]  [facility] Population Health RN Care Manager Direct Dial: [phone]  Fax: [phone]

## 2024-01-07 NOTE — Patient Instructions (Signed)
 Visit Information  Thank you for taking time to visit with me today. Please don't hesitate to contact me if I can be of assistance to you before our next scheduled appointment.  Your next care management appointment is by telephone on Monday, August 8th at 10:00am.    Please call the care guide team at [phone] if you need to cancel, schedule, or reschedule an appointment.   Please call the [place]  National Suicide Prevention Lifeline: 314-638-4170 or TTY: 224 492 2132 TTY 5038175449) to talk to a trained counselor if you are experiencing a Mental Health or Behavioral Health Crisis or need someone to talk to.  Santana Stamp BSN, CCM [place]  [facility] Population Health RN Care Manager Direct Dial: [phone]  Fax: [phone]

## 2024-01-20 ENCOUNTER — Telehealth: Payer: Self-pay

## 2024-02-03 ENCOUNTER — Other Ambulatory Visit

## 2024-02-03 DIAGNOSIS — M79644 Pain in right finger(s): Secondary | ICD-10-CM | POA: Diagnosis not present

## 2024-02-05 ENCOUNTER — Other Ambulatory Visit: Payer: Self-pay

## 2024-02-05 NOTE — Patient Outreach (Signed)
 Complex Care Management   Visit Note  02/05/2024  Name:  Julia Simmons MRN: [id number] DOB: 03/07/1961  Situation: Referral received for Complex Care Management related to DM. I obtained verbal consent from Patient.  Visit completed with Patient  on the phone.  Main concern today is treating gout pain in right thumb, taking prednisone as ordered, states she has seen an increase in her blood sugars since starting prednisone.   Background:   Past Medical History:  Diagnosis Date   Charcot's joint of foot    Coronary artery disease    Diabetes mellitus with neuropathy (HCC)    Diabetic retinopathy (HCC)    Hypertension    Morbid obesity with BMI of 50.0-59.9, adult (HCC)    Sleep apnea    Stroke [facility])     Assessment: Patient Reported Symptoms:  Cognitive Cognitive Status: Alert and oriented to person, place, and time, Normal speech and language skills, Insightful and able to interpret abstract concepts      Neurological Neurological Review of Symptoms: No symptoms reported    HEENT HEENT Symptoms Reported: Not assessed      Cardiovascular Cardiovascular Symptoms Reported: Swelling in legs or feet (Minimal left ankle swelling)    Respiratory Respiratory Symptoms Reported: No symptoms reported    Endocrine Endocrine Symptoms Reported: No symptoms reported Is patient diabetic?: Yes Is patient checking blood sugars at home?: Yes List most recent blood sugar readings, include date and time of day: Today's fasting 167mg /dL, taking prednisone x 6 days for gout pain.    Gastrointestinal Gastrointestinal Symptoms Reported: No symptoms reported      Genitourinary Genitourinary Symptoms Reported: No symptoms reported    Integumentary Integumentary Symptoms Reported: No symptoms reported    Musculoskeletal Musculoskelatal Symptoms Reviewed: No symptoms reported        Psychosocial Psychosocial Symptoms Reported: No symptoms reported          02/05/2024    PHQ2-9 Depression  Screening   Little interest or pleasure in doing things    Feeling down, depressed, or hopeless    PHQ-2 - Total Score    Trouble falling or staying asleep, or sleeping too much    Feeling tired or having little energy    Poor appetite or overeating     Feeling bad about yourself - or that you are a failure or have let yourself or your family down    Trouble concentrating on things, such as reading the newspaper or watching television    Moving or speaking so slowly that other people could have noticed.  Or the opposite - being so fidgety or restless that you have been moving around a lot more than usual    Thoughts that you would be better off dead, or hurting yourself in some way    PHQ2-9 Total Score    If you checked off any problems, how difficult have these problems made it for you to do your work, take care of things at home, or get along with other people    Depression Interventions/Treatment      There were no vitals filed for this visit.  Medications Reviewed Today     Reviewed by Lucian Santana LABOR, RN (Registered Nurse) on 02/05/24 at 1352  Med List Status: <None>   Medication Order Taking? Sig Documenting Provider Last Dose Status Informant  albuterol  (VENTOLIN  HFA) 108 (90 Base) MCG/ACT inhaler [id number]  Inhale into the lungs. [provider]  Active Self  aspirin  EC 81 MG tablet [id number]  Take 81 mg by mouth daily. [provider]  Active Self  atorvastatin  (LIPITOR ) 80 MG tablet [id number]  Take 1 tablet (80 mg total) by mouth daily. Barbarann Nest, MD  Active   budesonide  (PULMICORT ) 0.5 MG/2ML nebulizer solution [id number]  Take 2 mLs (0.5 mg total) by nebulization every 12 (twelve) hours. Barbarann Nest, MD  Expired 04/26/23 2359   clopidogrel  (PLAVIX ) 75 MG tablet [id number]  Take 75 mg by mouth daily. [provider]  Active Self  Continuous Blood Gluc Sensor (FREESTYLE LIBRE SENSOR SYSTEM) MISC [id number]  Use 1 kit every 14 (fourteen) days  for glucose monitoring [provider]  Active Self  furosemide  (LASIX ) 40 MG tablet [id number]  Take 1 tablet (40 mg total) by mouth 2 (two) times daily. Barbarann Nest, MD  Active   Insulin  Aspart FlexPen (NOVOLOG ) 100 UNIT/ML [id number] Yes Inject 10 Units into the skin 3 (three) times daily before meals. Before lunch and dinner Barbarann Nest, MD  Active   insulin  glargine (LANTUS ) 100 UNIT/ML injection [id number] Yes Inject 100 Units into the skin at bedtime. [provider]  Active Self           Med Note SHIRLENE, MONICA   Tue Oct 15, 2023 10:29 AM) 30 units at bedtime   Insulin  Glargine-Lixisenatide (SOLIQUA) 100-33 UNT-MCG/ML SOPN [id number] Yes Inject 60 Units into the skin daily before breakfast. [provider]  Active   linagliptin  (TRADJENTA ) 5 MG TABS tablet [id number]  Take 1 tablet (5 mg total) by mouth daily.  Patient not taking: Reported on 10/15/2023   Barbarann Nest, MD  Active   metFORMIN (GLUCOPHAGE) 500 MG tablet [id number] Yes Take 1,000 mg by mouth daily with breakfast. [provider]  Active Self  metFORMIN (GLUCOPHAGE) 500 MG tablet [id number] Yes Take 1,000 mg by mouth daily with supper. [provider]  Active Self  metoprolol  succinate (TOPROL -XL) 25 MG 24 hr tablet [id number]  Take 1 tablet (25 mg total) by mouth 2 (two) times daily. Barbarann Nest, MD  Active   oxybutynin  (DITROPAN -XL) 10 MG 24 hr tablet [id number]  Take 1 tablet by mouth daily. [provider]  Active Self  sacubitril -valsartan  (ENTRESTO ) 49-51 MG [id number]  Take 1 tablet by mouth 2 (two) times daily. Barbarann Nest, MD  Active   spironolactone  (ALDACTONE ) 25 MG tablet [id number]  Take by mouth. [provider]  Active Self            Recommendation:   -patient will take insulin  as ordered, will return to Endocrinology on 03/09/24.  -Patient will continue on DM diet, low carb, unsweetened fluids  Follow Up Plan:   Telephone  follow-up two weeks  Santana Stamp BSN, CCM [place]  [facility] Population Health RN Care Manager Direct Dial: [phone]  Fax: [phone]

## 2024-02-05 NOTE — Patient Instructions (Signed)
 Visit Information  Thank you for taking time to visit with me today. Please don't hesitate to contact me if I can be of assistance to you before our next scheduled appointment.  Your next care management appointment is by telephone on Wednesday, September 3rd at 10:00am.    Please call the care guide team at [phone] if you need to cancel, schedule, or reschedule an appointment.   A reminder to ALL patients/family/friends, please call the [place]  National Suicide Prevention Lifeline: 731-634-8113 or TTY: 408-650-7223 TTY 617-506-3170) to talk to a trained counselor if you are experiencing a Mental Health or Behavioral Health Crisis or need someone to talk to.  Santana Stamp BSN, CCM   [facility] Population Health RN Care Manager Direct Dial: [phone]  Fax: [phone]

## 2024-02-11 DIAGNOSIS — R350 Frequency of micturition: Secondary | ICD-10-CM | POA: Diagnosis not present

## 2024-02-11 DIAGNOSIS — Z794 Long term (current) use of insulin: Secondary | ICD-10-CM | POA: Diagnosis not present

## 2024-02-11 DIAGNOSIS — N390 Urinary tract infection, site not specified: Secondary | ICD-10-CM | POA: Diagnosis not present

## 2024-02-11 DIAGNOSIS — E1122 Type 2 diabetes mellitus with diabetic chronic kidney disease: Secondary | ICD-10-CM | POA: Diagnosis not present

## 2024-02-11 DIAGNOSIS — N1831 Chronic kidney disease, stage 3a: Secondary | ICD-10-CM | POA: Diagnosis not present

## 2024-02-19 ENCOUNTER — Other Ambulatory Visit: Payer: Self-pay

## 2024-02-19 NOTE — Patient Instructions (Signed)
 Visit Information  Thank you for taking time to visit with me today. Please don't hesitate to contact me if I can be of assistance to you before our next scheduled appointment.  Your next care management appointment is by telephone on Tuesday, September 23rd  at 10:00am.   Please call the care guide team at [phone] if you need to cancel, schedule, or reschedule an appointment.   A reminder to ALL patients/family/friends, please call the [place]  National Suicide Prevention Lifeline: (619)396-0926 or TTY: (865) 748-7045 TTY 873-127-8585) to talk to a trained counselor if you are experiencing a Mental Health or Behavioral Health Crisis or need someone to talk to.  Santana Stamp BSN, CCM [place]  [facility] Population Health RN Care Manager Direct Dial: [phone]  Fax: [phone]

## 2024-02-19 NOTE — Patient Outreach (Signed)
 Complex Care Management   Visit Note  02/19/2024  Name:  Julia Simmons MRN: [id number] DOB: Nov 21, 1960  Situation: Referral received for Complex Care Management related to DM. I obtained verbal consent from Patient.  Visit completed with Patient  on the phone. Reports today's fasting blood sugar at 113 mg/dL, this RNCM praised patient for working to get her fasting blood sugars under control.  She was taking prednisone for gout pain, pain has subsided, prednisone taper has been completed.  Background:   Past Medical History:  Diagnosis Date   Charcot's joint of foot    Coronary artery disease    Diabetes mellitus with neuropathy (HCC)    Diabetic retinopathy (HCC)    Hypertension    Morbid obesity with BMI of 50.0-59.9, adult (HCC)    Sleep apnea    Stroke [facility])     Assessment: Patient Reported Symptoms:  Cognitive Cognitive Status: Alert and oriented to person, place, and time, Normal speech and language skills      Neurological Neurological Review of Symptoms: Not assessed    HEENT HEENT Symptoms Reported: Not assessed      Cardiovascular Cardiovascular Symptoms Reported: Not assessed    Respiratory Respiratory Symptoms Reported: No symptoms reported    Endocrine Endocrine Symptoms Reported: No symptoms reported Is patient diabetic?: Yes Is patient checking blood sugars at home?: Yes List most recent blood sugar readings, include date and time of day: Today's fasting blood sugar: 113mg /dL.  She has finished with prednisone, states blood sugars are much better. Endocrine Self-Management Outcome: 4 (good)  Gastrointestinal Gastrointestinal Symptoms Reported: Not assessed      Genitourinary Genitourinary Symptoms Reported: No symptoms reported    Integumentary Integumentary Symptoms Reported: Not assessed    Musculoskeletal Musculoskelatal Symptoms Reviewed: Not assessed        Psychosocial Psychosocial Symptoms Reported: Not assessed          02/19/2024     PHQ2-9 Depression Screening   Little interest or pleasure in doing things    Feeling down, depressed, or hopeless    PHQ-2 - Total Score    Trouble falling or staying asleep, or sleeping too much    Feeling tired or having little energy    Poor appetite or overeating     Feeling bad about yourself - or that you are a failure or have let yourself or your family down    Trouble concentrating on things, such as reading the newspaper or watching television    Moving or speaking so slowly that other people could have noticed.  Or the opposite - being so fidgety or restless that you have been moving around a lot more than usual    Thoughts that you would be better off dead, or hurting yourself in some way    PHQ2-9 Total Score    If you checked off any problems, how difficult have these problems made it for you to do your work, take care of things at home, or get along with other people    Depression Interventions/Treatment      There were no vitals filed for this visit.  Medications Reviewed Today   Medications were not reviewed in this encounter     Recommendation:   Patient is aware of upcoming appointments/labs.  Specialty provider follow-up Cardiology 02/24/24, Endocrinology 03/09/24.  Continue Current Plan of Care  Follow Up Plan:   Telephone follow-up Tuesday, Sept 23rd at 10:00am.   Santana Stamp BSN, CCM [place]  [facility] Population Health RN  Care Manager Direct Dial: [phone]  Fax: [phone]

## 2024-02-20 DIAGNOSIS — Z8673 Personal history of transient ischemic attack (TIA), and cerebral infarction without residual deficits: Secondary | ICD-10-CM | POA: Diagnosis not present

## 2024-02-20 DIAGNOSIS — I10 Essential (primary) hypertension: Secondary | ICD-10-CM | POA: Diagnosis not present

## 2024-02-20 DIAGNOSIS — G4733 Obstructive sleep apnea (adult) (pediatric): Secondary | ICD-10-CM | POA: Diagnosis not present

## 2024-02-21 DIAGNOSIS — N1831 Chronic kidney disease, stage 3a: Secondary | ICD-10-CM | POA: Diagnosis not present

## 2024-02-21 DIAGNOSIS — I10 Essential (primary) hypertension: Secondary | ICD-10-CM | POA: Diagnosis not present

## 2024-02-21 DIAGNOSIS — E7800 Pure hypercholesterolemia, unspecified: Secondary | ICD-10-CM | POA: Diagnosis not present

## 2024-02-21 DIAGNOSIS — Z794 Long term (current) use of insulin: Secondary | ICD-10-CM | POA: Diagnosis not present

## 2024-02-21 DIAGNOSIS — E1122 Type 2 diabetes mellitus with diabetic chronic kidney disease: Secondary | ICD-10-CM | POA: Diagnosis not present

## 2024-02-24 DIAGNOSIS — I214 Non-ST elevation (NSTEMI) myocardial infarction: Secondary | ICD-10-CM | POA: Diagnosis not present

## 2024-02-24 DIAGNOSIS — I255 Ischemic cardiomyopathy: Secondary | ICD-10-CM | POA: Diagnosis not present

## 2024-02-24 DIAGNOSIS — I63511 Cerebral infarction due to unspecified occlusion or stenosis of right middle cerebral artery: Secondary | ICD-10-CM | POA: Diagnosis not present

## 2024-02-24 DIAGNOSIS — E782 Mixed hyperlipidemia: Secondary | ICD-10-CM | POA: Diagnosis not present

## 2024-02-24 DIAGNOSIS — I5023 Acute on chronic systolic (congestive) heart failure: Secondary | ICD-10-CM | POA: Diagnosis not present

## 2024-02-24 DIAGNOSIS — Z955 Presence of coronary angioplasty implant and graft: Secondary | ICD-10-CM | POA: Diagnosis not present

## 2024-02-24 DIAGNOSIS — N1832 Chronic kidney disease, stage 3b: Secondary | ICD-10-CM | POA: Diagnosis not present

## 2024-02-24 DIAGNOSIS — I10 Essential (primary) hypertension: Secondary | ICD-10-CM | POA: Diagnosis not present

## 2024-02-24 DIAGNOSIS — E7800 Pure hypercholesterolemia, unspecified: Secondary | ICD-10-CM | POA: Diagnosis not present

## 2024-02-24 DIAGNOSIS — I2510 Atherosclerotic heart disease of native coronary artery without angina pectoris: Secondary | ICD-10-CM | POA: Diagnosis not present

## 2024-02-24 DIAGNOSIS — I5021 Acute systolic (congestive) heart failure: Secondary | ICD-10-CM | POA: Diagnosis not present

## 2024-02-24 DIAGNOSIS — E119 Type 2 diabetes mellitus without complications: Secondary | ICD-10-CM | POA: Diagnosis not present

## 2024-02-26 DIAGNOSIS — I10 Essential (primary) hypertension: Secondary | ICD-10-CM | POA: Diagnosis not present

## 2024-02-26 DIAGNOSIS — E1122 Type 2 diabetes mellitus with diabetic chronic kidney disease: Secondary | ICD-10-CM | POA: Diagnosis not present

## 2024-02-26 DIAGNOSIS — Z1331 Encounter for screening for depression: Secondary | ICD-10-CM | POA: Diagnosis not present

## 2024-02-26 DIAGNOSIS — N390 Urinary tract infection, site not specified: Secondary | ICD-10-CM | POA: Diagnosis not present

## 2024-02-26 DIAGNOSIS — G4733 Obstructive sleep apnea (adult) (pediatric): Secondary | ICD-10-CM | POA: Diagnosis not present

## 2024-02-26 DIAGNOSIS — E790 Hyperuricemia without signs of inflammatory arthritis and tophaceous disease: Secondary | ICD-10-CM | POA: Diagnosis not present

## 2024-02-26 DIAGNOSIS — E7800 Pure hypercholesterolemia, unspecified: Secondary | ICD-10-CM | POA: Diagnosis not present

## 2024-02-26 DIAGNOSIS — M109 Gout, unspecified: Secondary | ICD-10-CM | POA: Diagnosis not present

## 2024-02-26 DIAGNOSIS — E1165 Type 2 diabetes mellitus with hyperglycemia: Secondary | ICD-10-CM | POA: Diagnosis not present

## 2024-02-26 DIAGNOSIS — I2510 Atherosclerotic heart disease of native coronary artery without angina pectoris: Secondary | ICD-10-CM | POA: Diagnosis not present

## 2024-02-26 DIAGNOSIS — Z0000 Encounter for general adult medical examination without abnormal findings: Secondary | ICD-10-CM | POA: Diagnosis not present

## 2024-03-09 DIAGNOSIS — E1122 Type 2 diabetes mellitus with diabetic chronic kidney disease: Secondary | ICD-10-CM | POA: Diagnosis not present

## 2024-03-09 DIAGNOSIS — E113293 Type 2 diabetes mellitus with mild nonproliferative diabetic retinopathy without macular edema, bilateral: Secondary | ICD-10-CM | POA: Diagnosis not present

## 2024-03-09 DIAGNOSIS — E785 Hyperlipidemia, unspecified: Secondary | ICD-10-CM | POA: Diagnosis not present

## 2024-03-09 DIAGNOSIS — E11610 Type 2 diabetes mellitus with diabetic neuropathic arthropathy: Secondary | ICD-10-CM | POA: Diagnosis not present

## 2024-03-09 DIAGNOSIS — E1129 Type 2 diabetes mellitus with other diabetic kidney complication: Secondary | ICD-10-CM | POA: Diagnosis not present

## 2024-03-09 DIAGNOSIS — Z794 Long term (current) use of insulin: Secondary | ICD-10-CM | POA: Diagnosis not present

## 2024-03-09 DIAGNOSIS — E66813 Obesity, class 3: Secondary | ICD-10-CM | POA: Diagnosis not present

## 2024-03-09 DIAGNOSIS — E1159 Type 2 diabetes mellitus with other circulatory complications: Secondary | ICD-10-CM | POA: Diagnosis not present

## 2024-03-09 DIAGNOSIS — R809 Proteinuria, unspecified: Secondary | ICD-10-CM | POA: Diagnosis not present

## 2024-03-09 DIAGNOSIS — E1169 Type 2 diabetes mellitus with other specified complication: Secondary | ICD-10-CM | POA: Diagnosis not present

## 2024-03-09 DIAGNOSIS — E1165 Type 2 diabetes mellitus with hyperglycemia: Secondary | ICD-10-CM | POA: Diagnosis not present

## 2024-03-09 DIAGNOSIS — N1832 Chronic kidney disease, stage 3b: Secondary | ICD-10-CM | POA: Diagnosis not present

## 2024-03-10 ENCOUNTER — Other Ambulatory Visit: Payer: Self-pay

## 2024-03-11 NOTE — Patient Outreach (Signed)
 Patient was concerned Pat would not be covered by her insurance, she will be picking up today to find out the cost, requested call back on 03/11/24 to discuss plan from Endocrinologist visit on 9/22.

## 2024-03-18 ENCOUNTER — Telehealth: Payer: Self-pay

## 2024-03-27 ENCOUNTER — Telehealth: Payer: Self-pay

## 2024-03-30 ENCOUNTER — Telehealth: Payer: Self-pay

## 2024-03-30 NOTE — Patient Instructions (Signed)
 Visit Information  Thank you for taking time to visit with me today. Please don't hesitate to contact me if I can be of assistance to you before our next scheduled appointment.  Your next care management appointment is by telephone on Monday, October 27th at 3:00pm   Please call the care guide team at [phone] if you need to cancel, schedule, or reschedule an appointment.   Please call the [place]  National Suicide Prevention Lifeline: 431-162-3273 or TTY: 682-239-3904 TTY (303) 033-8133) to talk to a trained counselor if you are experiencing a Mental Health or Behavioral Health Crisis or need someone to talk to.  Santana Stamp BSN, CCM [place]  [facility] Population Health RN Care Manager Direct Dial: [phone]  Fax: [phone]

## 2024-03-30 NOTE — Patient Outreach (Signed)
 Complex Care Management   Visit Note  03/30/2024  Name:  Julia Simmons MRN: [id number] DOB: 16-Jan-1961  Situation: Referral received for Complex Care Management related to Diabetes with Complications I obtained verbal consent from Patient.  Visit completed with Julia Simmons  on the phone. Patient was relieved insurance paid for [facility], she is taking as prescribed due to elevation of A1C 9.1% on 02/21/24, ups from 8.4%  12/02/23  Background:   Past Medical History:  Diagnosis Date   Charcot's joint of foot    Coronary artery disease    Diabetes mellitus with neuropathy (HCC)    Diabetic retinopathy (HCC)    Hypertension    Morbid obesity with BMI of 50.0-59.9, adult (HCC)    Sleep apnea    Stroke [facility])     Assessment: Patient Reported Symptoms:  Cognitive Cognitive Status: No symptoms reported, Alert and oriented to person, place, and time, Insightful and able to interpret abstract concepts, Normal speech and language skills      Neurological Neurological Review of Symptoms: No symptoms reported    HEENT HEENT Symptoms Reported: No symptoms reported HEENT Management Strategies: Routine screening HEENT Comment: Optometrist scheduled for December 2025    Cardiovascular Cardiovascular Symptoms Reported: Not assessed    Respiratory Respiratory Symptoms Reported: No symptoms reported    Endocrine Endocrine Symptoms Reported: No symptoms reported Is patient diabetic?: Yes Is patient checking blood sugars at home?: Yes List most recent blood sugar readings, include date and time of day: 153mg /dL - unable to provide time of day, Endocrine Comment: Reports insurance covered Mounjaro  Gastrointestinal Additional Gastrointestinal Details: Since starting Mounjaro, reports somtimes my stomach doesn't feel right, feels like indigestion, states she can tolerate the side effect.  Denies nausea/vomiting/diarrhea.      Genitourinary Genitourinary Symptoms Reported: Not assessed     Integumentary Integumentary Symptoms Reported: Not assessed    Musculoskeletal Musculoskelatal Symptoms Reviewed: Not assessed        Psychosocial Psychosocial Symptoms Reported: Not assessed          03/30/2024    PHQ2-9 Depression Screening   Little interest or pleasure in doing things    Feeling down, depressed, or hopeless    PHQ-2 - Total Score    Trouble falling or staying asleep, or sleeping too much    Feeling tired or having little energy    Poor appetite or overeating     Feeling bad about yourself - or that you are a failure or have let yourself or your family down    Trouble concentrating on things, such as reading the newspaper or watching television    Moving or speaking so slowly that other people could have noticed.  Or the opposite - being so fidgety or restless that you have been moving around a lot more than usual    Thoughts that you would be better off dead, or hurting yourself in some way    PHQ2-9 Total Score    If you checked off any problems, how difficult have these problems made it for you to do your work, take care of things at home, or get along with other people    Depression Interventions/Treatment      There were no vitals filed for this visit.  Medications Reviewed Today   Medications were not reviewed in this encounter     Recommendation:   Specialty provider follow-up Endo 06/22/2024  Follow Up Plan:   Telephone follow-up in 1 month  [facility] BSN, CCM [facility]  [facility] Population Health RN Care Manager Direct Dial: [phone]  Fax: [phone]

## 2024-04-13 ENCOUNTER — Other Ambulatory Visit: Payer: Self-pay

## 2024-04-13 NOTE — Patient Instructions (Signed)
 Visit Information  Thank you for taking time to visit with me today. Please don't hesitate to contact me if I can be of assistance to you before our next scheduled appointment.  Your next care management appointment is Monday, November 24th at 3:00pm.   Please call the care guide team at [phone] if you need to cancel, schedule, or reschedule an appointment.   A reminder to ALL patients/family/friends, please call the [place]  National Suicide Prevention Lifeline: (339)348-8438 or TTY: (405) 440-4816 TTY [phone]) to talk to a trained counselor if you are experiencing a Mental Health or Behavioral Health Crisis or need someone to talk to.  Santana Stamp BSN, CCM   [facility] Population Health RN Care Manager Direct Dial: [phone]  Fax: [phone]

## 2024-04-13 NOTE — Patient Outreach (Signed)
 Complex Care Management   Visit Note  04/13/2024  Name:  Julia Simmons MRN: [id number] DOB: 01-11-1961  Situation: Referral received for Complex Care Management related to DM. I obtained verbal consent from Patient.  Visit completed with Julia Simmons  on the phone. She was started on Mounjaro 5mg  weekly due to A1C 9.1% (labs 02/21/24).  States she is doing well on the medication, was having some stomach symptoms, reported my stomach doesn't feel right - this has since resolved.   Background:   Past Medical History:  Diagnosis Date   Charcot's joint of foot    Coronary artery disease    Diabetes mellitus with neuropathy (HCC)    Diabetic retinopathy (HCC)    Hypertension    Morbid obesity with BMI of 50.0-59.9, adult (HCC)    Sleep apnea    Stroke [facility])     Assessment: Patient Reported Symptoms:  Cognitive Cognitive Status: Alert and oriented to person, place, and time, Insightful and able to interpret abstract concepts, Normal speech and language skills, No symptoms reported      Neurological Neurological Review of Symptoms: No symptoms reported    HEENT HEENT Symptoms Reported: Not assessed      Cardiovascular Cardiovascular Symptoms Reported: No symptoms reported    Respiratory Respiratory Symptoms Reported: No symptoms reported    Endocrine Endocrine Symptoms Reported: No symptoms reported Is patient diabetic?: Yes Is patient checking blood sugars at home?: Yes List most recent blood sugar readings, include date and time of day: Today's FBG: 111mg /dL Endocrine Self-Management Outcome: 4 (good)  Gastrointestinal Gastrointestinal Symptoms Reported: No symptoms reported      Genitourinary Genitourinary Symptoms Reported: No symptoms reported    Integumentary Integumentary Symptoms Reported: Not assessed    Musculoskeletal Musculoskelatal Symptoms Reviewed: Not assessed        Psychosocial Psychosocial Symptoms Reported: Not assessed          04/13/2024     PHQ2-9 Depression Screening   Little interest or pleasure in doing things    Feeling down, depressed, or hopeless    PHQ-2 - Total Score    Trouble falling or staying asleep, or sleeping too much    Feeling tired or having little energy    Poor appetite or overeating     Feeling bad about yourself - or that you are a failure or have let yourself or your family down    Trouble concentrating on things, such as reading the newspaper or watching television    Moving or speaking so slowly that other people could have noticed.  Or the opposite - being so fidgety or restless that you have been moving around a lot more than usual    Thoughts that you would be better off dead, or hurting yourself in some way    PHQ2-9 Total Score    If you checked off any problems, how difficult have these problems made it for you to do your work, take care of things at home, or get along with other people    Depression Interventions/Treatment      There were no vitals filed for this visit.  Medications Reviewed Today     Reviewed by Lucian Santana LABOR, RN (Registered Nurse) on 04/13/24 at 1519  Med List Status: <None>   Medication Order Taking? Sig Documenting Provider Last Dose Status Informant  albuterol  (VENTOLIN  HFA) 108 (90 Base) MCG/ACT inhaler [id number]  Inhale into the lungs. [provider]  Active Self  aspirin  EC 81 MG tablet [id number]  Take 81 mg by mouth daily. [provider]  Active Self  atorvastatin  (LIPITOR ) 80 MG tablet [id number]  Take 1 tablet (80 mg total) by mouth daily. Barbarann Nest, MD  Active   budesonide  (PULMICORT ) 0.5 MG/2ML nebulizer solution [id number]  Take 2 mLs (0.5 mg total) by nebulization every 12 (twelve) hours. Barbarann Nest, MD  Expired 04/26/23 2359   clopidogrel  (PLAVIX ) 75 MG tablet [id number]  Take 75 mg by mouth daily. [provider]  Active Self  Continuous Blood Gluc Sensor (FREESTYLE LIBRE SENSOR SYSTEM) MISC [id number] Yes Use 1 kit  every 14 (fourteen) days for glucose monitoring [provider]  Active Self  furosemide  (LASIX ) 40 MG tablet [id number]  Take 1 tablet (40 mg total) by mouth 2 (two) times daily. Barbarann Nest, MD  Active   Insulin  Aspart FlexPen (NOVOLOG ) 100 UNIT/ML [id number]  Inject 10 Units into the skin 3 (three) times daily before meals. Before lunch and dinner Barbarann Nest, MD  Expired 02/05/24 2359   insulin  glargine (LANTUS ) 100 UNIT/ML injection [id number] Yes Inject 100 Units into the skin at bedtime. [provider]  Active Self           Med Note SHIRLENE, MONICA   Tue Oct 15, 2023 10:29 AM) 30 units at bedtime   Insulin  Glargine-Lixisenatide (SOLIQUA) 100-33 UNT-MCG/ML SOPN [id number]  Inject 60 Units into the skin daily before breakfast.  Patient not taking: Reported on 04/13/2024   [provider]  Active   linagliptin  (TRADJENTA ) 5 MG TABS tablet [id number]  Take 1 tablet (5 mg total) by mouth daily.  Patient not taking: Reported on 10/15/2023   Barbarann Nest, MD  Active   metFORMIN (GLUCOPHAGE) 500 MG tablet [id number] Yes Take 1,000 mg by mouth daily with breakfast. [provider]  Active Self  metFORMIN (GLUCOPHAGE) 500 MG tablet [id number] Yes Take 1,000 mg by mouth daily with supper. [provider]  Active Self  metoprolol  succinate (TOPROL -XL) 25 MG 24 hr tablet [id number]  Take 1 tablet (25 mg total) by mouth 2 (two) times daily. Barbarann Nest, MD  Active   oxybutynin  (DITROPAN -XL) 10 MG 24 hr tablet [id number]  Take 1 tablet by mouth daily. [provider]  Active Self  sacubitril -valsartan  (ENTRESTO ) 49-51 MG [id number]  Take 1 tablet by mouth 2 (two) times daily. Barbarann Nest, MD  Active   spironolactone  (ALDACTONE ) 25 MG tablet [id number]  Take by mouth. [provider]  Active Self  tirzepatide CLOYDE) 5 MG/0.5ML Pen [id number] Yes Inject 5 mg into the skin once a week. [provider]  Active              Recommendation:   Specialty provider follow-up Endocrinology 06/22/2024 Continue Current Plan of Care  Follow Up Plan:   Telephone follow-up in 1 month  Santana Stamp BSN, CCM [place]  [facility] Population Health RN Care Manager Direct Dial: [phone]  Fax: [phone]

## 2024-05-11 ENCOUNTER — Other Ambulatory Visit: Payer: Self-pay

## 2024-05-11 NOTE — Patient Instructions (Signed)
 Visit Information  Thank you for taking time to visit with me today. Please don't hesitate to contact me if I can be of assistance to you before our next scheduled appointment.  Your next care management appointment is by telephone on Monday, December 22 at 2:00pm  Please call the care guide team at [phone] if you need to cancel, schedule, or reschedule an appointment.   Please call the [place]  National Suicide Prevention Lifeline: 819-506-7292 or TTY: 315 282 9831 TTY (302)422-9286) to talk to a trained counselor if you are experiencing a Mental Health or Behavioral Health Crisis or need someone to talk to.  Santana Stamp BSN, CCM [place]  [facility] Population Health RN Care Manager Direct Dial: [phone]  Fax: [phone]

## 2024-05-11 NOTE — Patient Outreach (Signed)
 Complex Care Management   Visit Note  05/11/2024  Name:  LISSETH BRAZEAU MRN: [id number] DOB: 02-21-1961  Situation: Referral received for Complex Care Management related to DM.  I obtained verbal consent from Patient.  Visit completed with Ms. Stout  on the phone  Background:    A1C 9.1% on 02/21/24, 8.4% on 12/02/23. Past Medical History:  Diagnosis Date   Charcot's joint of foot    Coronary artery disease    Diabetes mellitus with neuropathy (HCC)    Diabetic retinopathy (HCC)    Hypertension    Morbid obesity with BMI of 50.0-59.9, adult (HCC)    Sleep apnea    Stroke [facility])     Assessment: Patient Reported Symptoms:  Cognitive Cognitive Status: No symptoms reported, Alert and oriented to person, place, and time, Insightful and able to interpret abstract concepts, Normal speech and language skills      Neurological Neurological Review of Symptoms: No symptoms reported    HEENT HEENT Symptoms Reported: Not assessed      Cardiovascular Cardiovascular Symptoms Reported: No symptoms reported    Respiratory Respiratory Symptoms Reported: No symptoms reported    Endocrine Is patient diabetic?: Yes Is patient checking blood sugars at home?: Yes    Gastrointestinal Gastrointestinal Symptoms Reported: Not assessed      Genitourinary Genitourinary Symptoms Reported: No symptoms reported    Integumentary Integumentary Symptoms Reported: Not assessed    Musculoskeletal Musculoskelatal Symptoms Reviewed: No symptoms reported        Psychosocial Psychosocial Symptoms Reported: No symptoms reported          05/11/2024    PHQ2-9 Depression Screening   Little interest or pleasure in doing things    Feeling down, depressed, or hopeless    PHQ-2 - Total Score    Trouble falling or staying asleep, or sleeping too much    Feeling tired or having little energy    Poor appetite or overeating     Feeling bad about yourself - or that you are a failure or have let yourself  or your family down    Trouble concentrating on things, such as reading the newspaper or watching television    Moving or speaking so slowly that other people could have noticed.  Or the opposite - being so fidgety or restless that you have been moving around a lot more than usual    Thoughts that you would be better off dead, or hurting yourself in some way    PHQ2-9 Total Score    If you checked off any problems, how difficult have these problems made it for you to do your work, take care of things at home, or get along with other people    Depression Interventions/Treatment      There were no vitals filed for this visit. Pain Scale: 0-10 Pain Score: 0-No pain  MEDICATIONS: No problems with medication refills, taking as prescribed.   Recommendation:   Specialty provider follow-up : Endocrinology 06/22/2024 Continue Current Plan of Care  Follow Up Plan:   Telephone follow-up in 1 month  Santana Stamp BSN, CCM [place]  [facility] Population Health RN Care Manager Direct Dial: [phone]  Fax: [phone] :

## 2024-05-20 DIAGNOSIS — I10 Essential (primary) hypertension: Secondary | ICD-10-CM | POA: Diagnosis not present

## 2024-05-20 DIAGNOSIS — Z8673 Personal history of transient ischemic attack (TIA), and cerebral infarction without residual deficits: Secondary | ICD-10-CM | POA: Diagnosis not present

## 2024-06-08 ENCOUNTER — Other Ambulatory Visit: Payer: Self-pay

## 2024-06-08 NOTE — Patient Outreach (Signed)
 Complex Care Management   Visit Note  06/08/2024  Name:  Julia Simmons MRN: [id number] DOB: May 21, 1961  Situation: Referral received for Complex Care Management related to Diabetes with Complications and CAD I obtained verbal consent from Patient.  Visit completed with [name]  on the phone  Background:   Past Medical History:  Diagnosis Date   Charcot's joint of foot    Coronary artery disease    Diabetes mellitus with neuropathy (HCC)    Diabetic retinopathy (HCC)    Hypertension    Morbid obesity with BMI of 50.0-59.9, adult (HCC)    Sleep apnea    Stroke [facility])     Assessment: Patient Reported Symptoms:  Cognitive Cognitive Status: No symptoms reported, Alert and oriented to person, place, and time, Insightful and able to interpret abstract concepts, Normal speech and language skills   Health Maintenance Behaviors: Annual physical exam  Neurological Neurological Review of Symptoms: Not assessed    HEENT HEENT Symptoms Reported: Not assessed HEENT Management Strategies: Routine screening HEENT Comment: Had eye exam 02/26/2024 and is scheduled again in January    Cardiovascular Cardiovascular Symptoms Reported: Not assessed    Respiratory Respiratory Symptoms Reported: Not assesed    Endocrine Endocrine Symptoms Reported: Hyperglycemia Is patient diabetic?: Yes Is patient checking blood sugars at home?: Yes List most recent blood sugar readings, include date and time of day: Pt reports she checks BGM daily but could not remember todays result. She admits they have been high due to recent tx with prednisone which she has now completed. Endocrine Self-Management Outcome: 3 (uncertain) Endocrine Comment: Asked if she has a Dietician and she reports she will never go thru that again b/c she does not like being told what to do. Explained that if she changes her mind and would like any nutritional support or education to please reach out and we can help her with resources.  Confirmed she has contact # for [facility]  Gastrointestinal Gastrointestinal Symptoms Reported: Not assessed      Genitourinary Genitourinary Symptoms Reported: Not assessed    Integumentary Integumentary Symptoms Reported: Not assessed    Musculoskeletal Musculoskelatal Symptoms Reviewed: Not assessed        Psychosocial Psychosocial Symptoms Reported: Not assessed          06/08/2024    PHQ2-9 Depression Screening   Little interest or pleasure in doing things    Feeling down, depressed, or hopeless    PHQ-2 - Total Score    Trouble falling or staying asleep, or sleeping too much    Feeling tired or having little energy    Poor appetite or overeating     Feeling bad about yourself - or that you are a failure or have let yourself or your family down    Trouble concentrating on things, such as reading the newspaper or watching television    Moving or speaking so slowly that other people could have noticed.  Or the opposite - being so fidgety or restless that you have been moving around a lot more than usual    Thoughts that you would be better off dead, or hurting yourself in some way    PHQ2-9 Total Score    If you checked off any problems, how difficult have these problems made it for you to do your work, take care of things at home, or get along with other people    Depression Interventions/Treatment      There were no vitals filed for this visit.  Medications Reviewed Today   Medications were not reviewed in this encounter     Recommendation:   Specialty provider follow-up Endo on 06/23/2024, Cardiology on 07/03/2024 and Eye exam in January (unsure of date during call) Continue Current Plan of Care  Follow Up Plan:   Telephone follow-up in 1 month Telephone follow up appointment date/time:  06/29/2024 @1400   Elida Pulse, RNCM Case Manager [facility], Population Health Direct Dial: [phone]

## 2024-06-08 NOTE — Patient Instructions (Signed)
 Visit Information  Thank you for taking time to visit with me today. Please don't hesitate to contact me if I can be of assistance to you before our next scheduled appointment.  Your next care management appointment is by telephone on 1/12/20265 at 1400   Please call the care guide team at [phone] if you need to cancel, schedule, or reschedule an appointment.   Please call the [place]  National Suicide Prevention Lifeline: 949-762-2236 or TTY: 6200155456 TTY (432)681-8917) to talk to a trained counselor if you are experiencing a Mental Health or Behavioral Health Crisis or need someone to talk to.  Elida Pulse, RNCM Case Manager [facility], Population Health Direct Dial: [phone]

## 2024-06-29 ENCOUNTER — Other Ambulatory Visit: Payer: Self-pay

## 2024-06-29 NOTE — Patient Instructions (Signed)
 Visit Information  Thank you for taking time to visit with me today. Please don't hesitate to contact me if I can be of assistance to you before our next scheduled appointment.  Your next care management appointment is by telephone on 07/13/2024 at 1230pm   Please call the care guide team at [phone] if you need to cancel, schedule, or reschedule an appointment.   Please call the [place]  National Suicide Prevention Lifeline: 479-065-0757 or TTY: (438) 573-4540 TTY [phone]) to talk to a trained counselor if you are experiencing a Mental Health or Behavioral Health Crisis or need someone to talk to.  Elida Pulse, RNCM Case Manager [facility], Population Health Direct Dial: [phone]

## 2024-06-29 NOTE — Patient Outreach (Signed)
 Complex Care Management   Visit Note  06/29/2024  Name:  Julia Simmons MRN: [id number] DOB: 1961/04/28  Situation: Referral received for Complex Care Management related to Diabetes with Complications and CAD I obtained verbal consent from Patient.  Visit completed with Julia Simmons  on the phone  Background:   Past Medical History:  Diagnosis Date   Charcot's joint of foot    Coronary artery disease    Diabetes mellitus with neuropathy (HCC)    Diabetic retinopathy (HCC)    Hypertension    Morbid obesity with BMI of 50.0-59.9, adult (HCC)    Sleep apnea    Stroke [facility])     Assessment: Patient Reported Symptoms:  Cognitive Cognitive Status: No symptoms reported, Alert and oriented to person, place, and time, Insightful and able to interpret abstract concepts, Normal speech and language skills      Neurological Neurological Review of Symptoms: Not assessed    HEENT HEENT Symptoms Reported: Not assessed      Cardiovascular Cardiovascular Symptoms Reported: Not assessed    Respiratory Respiratory Symptoms Reported: Not assesed    Endocrine Endocrine Symptoms Reported: No symptoms reported Is patient diabetic?: Yes Is patient checking blood sugars at home?: Yes List most recent blood sugar readings, include date and time of day: FBS this am =130. HgA1c on 06/19/2024=8.7 Endocrine Self-Management Outcome: 3 (uncertain)  Gastrointestinal Gastrointestinal Symptoms Reported: No symptoms reported      Genitourinary Genitourinary Symptoms Reported: Not assessed    Integumentary Integumentary Symptoms Reported: Not assessed    Musculoskeletal Musculoskelatal Symptoms Reviewed: Not assessed        Psychosocial Psychosocial Symptoms Reported: Not assessed          06/29/2024    PHQ2-9 Depression Screening   Little interest or pleasure in doing things    Feeling down, depressed, or hopeless    PHQ-2 - Total Score    Trouble falling or staying asleep, or sleeping too much     Feeling tired or having little energy    Poor appetite or overeating     Feeling bad about yourself - or that you are a failure or have let yourself or your family down    Trouble concentrating on things, such as reading the newspaper or watching television    Moving or speaking so slowly that other people could have noticed.  Or the opposite - being so fidgety or restless that you have been moving around a lot more than usual    Thoughts that you would be better off dead, or hurting yourself in some way    PHQ2-9 Total Score    If you checked off any problems, how difficult have these problems made it for you to do your work, take care of things at home, or get along with other people    Depression Interventions/Treatment      There were no vitals filed for this visit. Pain Scale: 0-10 Pain Score: 0-No pain  Medications Reviewed Today   Medications were not reviewed in this encounter     Recommendation:   Continue Current Plan of Care Call [facility] with any new concerns  Follow Up Plan:   Telephone follow up appointment date/time:  07/13/2024 @1230  pm  Elida Pulse, RNCM Case Manager [facility], Population Health Direct Dial: [phone]

## 2024-07-10 ENCOUNTER — Telehealth: Payer: Self-pay

## 2024-07-10 NOTE — Patient Outreach (Signed)
 Left detailed voicemail with patient informing that [facility] is now aware medication has been approved by [employer] health insurance and will order Mounjaro 7.5mg  3 month supply, will be ready for pick up Monday afternoon 07/13/24, and to please call before heading to pick up.

## 2024-07-10 NOTE — Patient Outreach (Signed)
 Received call from Joe at [facility] in [place], he ran Mounjaro through their system, it has now been approved, medication will be available by Monday afternoon (07/13/24).

## 2024-07-10 NOTE — Patient Outreach (Signed)
 Contacted patient to discuss impending weather, possible electric and internet outages that may affect our phone appt scheduled for Monday, and to make sure patient has weather plan/medication refills/food/water.  Patient reports she has not yet received Mounjaro 7.5mg .  [facility] was waiting for prior approval to go through.  Patient states she has called [employer], staff states medication has been approved.  She has called Endocrinology, they have sent script over to pharmacy but patient still hasn't received a call that it is ready for pick up.  This RNCM will call [facility] to check status.

## 2024-07-10 NOTE — Patient Outreach (Signed)
 Attempted to contact [facility] in [place], left detailed VM checking on Mounjaro 7.5mg  ordered by Dr. Solum/Endocrinology, requesting call back to this RNCM at [phone].

## 2024-07-13 ENCOUNTER — Telehealth: Payer: Self-pay

## 2024-07-24 ENCOUNTER — Inpatient Hospital Stay: Admission: EM | Admit: 2024-07-24 | Source: Home / Self Care | Admitting: Family Medicine

## 2024-07-24 ENCOUNTER — Other Ambulatory Visit: Payer: Self-pay

## 2024-07-24 ENCOUNTER — Emergency Department

## 2024-07-24 DIAGNOSIS — L0291 Cutaneous abscess, unspecified: Principal | ICD-10-CM

## 2024-07-24 DIAGNOSIS — A419 Sepsis, unspecified organism: Secondary | ICD-10-CM | POA: Diagnosis present

## 2024-07-24 DIAGNOSIS — N179 Acute kidney failure, unspecified: Secondary | ICD-10-CM

## 2024-07-24 DIAGNOSIS — N390 Urinary tract infection, site not specified: Secondary | ICD-10-CM

## 2024-07-24 LAB — COMPREHENSIVE METABOLIC PANEL WITH GFR
ALT: 12 U/L (ref 0–44)
AST: 12 U/L — ABNORMAL LOW (ref 15–41)
Albumin: 4.3 g/dL (ref 3.5–5.0)
Alkaline Phosphatase: 88 U/L (ref 38–126)
Anion gap: 16 — ABNORMAL HIGH (ref 5–15)
BUN: 53 mg/dL — ABNORMAL HIGH (ref 8–23)
CO2: 23 mmol/L (ref 22–32)
Calcium: 10.1 mg/dL (ref 8.9–10.3)
Chloride: 99 mmol/L (ref 98–111)
Creatinine, Ser: 2.03 mg/dL — ABNORMAL HIGH (ref 0.44–1.00)
GFR, Estimated: 27 mL/min — ABNORMAL LOW
Glucose, Bld: 100 mg/dL — ABNORMAL HIGH (ref 70–99)
Potassium: 4.8 mmol/L (ref 3.5–5.1)
Sodium: 138 mmol/L (ref 135–145)
Total Bilirubin: 0.4 mg/dL (ref 0.0–1.2)
Total Protein: 8.1 g/dL (ref 6.5–8.1)

## 2024-07-24 LAB — URINALYSIS, ROUTINE W REFLEX MICROSCOPIC
Bilirubin Urine: NEGATIVE
Glucose, UA: NEGATIVE mg/dL
Hgb urine dipstick: NEGATIVE
Ketones, ur: NEGATIVE mg/dL
Nitrite: POSITIVE — AB
Protein, ur: NEGATIVE mg/dL
Specific Gravity, Urine: 1.013 (ref 1.005–1.030)
WBC, UA: >50 WBC/hpf (ref 0–5)
pH: 5 (ref 5.0–8.0)

## 2024-07-24 LAB — CBC WITH DIFFERENTIAL/PLATELET
Abs Immature Granulocytes: 0.05 10*3/uL (ref 0.00–0.07)
Basophils Absolute: 0.1 10*3/uL (ref 0.0–0.1)
Basophils Relative: 1 %
Eosinophils Absolute: 0.2 10*3/uL (ref 0.0–0.5)
Eosinophils Relative: 1 %
HCT: 40.3 % (ref 36.0–46.0)
Hemoglobin: 12.7 g/dL (ref 12.0–15.0)
Immature Granulocytes: 0 %
Lymphocytes Relative: 10 %
Lymphs Abs: 1.4 10*3/uL (ref 0.7–4.0)
MCH: 30.6 pg (ref 26.0–34.0)
MCHC: 31.5 g/dL (ref 30.0–36.0)
MCV: 97.1 fL (ref 80.0–100.0)
Monocytes Absolute: 1.3 10*3/uL — ABNORMAL HIGH (ref 0.1–1.0)
Monocytes Relative: 9 %
Neutro Abs: 11.8 10*3/uL — ABNORMAL HIGH (ref 1.7–7.7)
Neutrophils Relative %: 79 %
Platelets: 314 10*3/uL (ref 150–400)
RBC: 4.15 MIL/uL (ref 3.87–5.11)
RDW: 14 % (ref 11.5–15.5)
WBC: 14.7 10*3/uL — ABNORMAL HIGH (ref 4.0–10.5)
nRBC: 0 % (ref 0.0–0.2)

## 2024-07-24 LAB — LACTIC ACID, PLASMA: Lactic Acid, Venous: 1.4 mmol/L (ref 0.5–1.9)

## 2024-07-24 MED ORDER — SODIUM CHLORIDE 0.9 % IV BOLUS
1000.0000 mL | Freq: Once | INTRAVENOUS | Status: AC
  Administered 2024-07-24: 1000 mL via INTRAVENOUS

## 2024-07-24 MED ORDER — FENTANYL CITRATE (PF) 50 MCG/ML IJ SOSY
50.0000 ug | PREFILLED_SYRINGE | Freq: Once | INTRAMUSCULAR | Status: AC
  Administered 2024-07-24: 50 ug via INTRAVENOUS
  Filled 2024-07-24: qty 1

## 2024-07-24 MED ORDER — SODIUM CHLORIDE 0.9 % IV SOLN
1.0000 g | Freq: Once | INTRAVENOUS | Status: AC
  Administered 2024-07-24: 1 g via INTRAVENOUS
  Filled 2024-07-24: qty 10

## 2024-07-24 MED ORDER — LIDOCAINE HCL (PF) 1 % IJ SOLN
5.0000 mL | Freq: Once | INTRAMUSCULAR | Status: AC
  Administered 2024-07-24: 5 mL
  Filled 2024-07-24: qty 5

## 2024-07-24 MED ORDER — LACTATED RINGERS IV BOLUS
1000.0000 mL | Freq: Once | INTRAVENOUS | Status: AC
  Administered 2024-07-24: 1000 mL via INTRAVENOUS

## 2024-07-24 NOTE — ED Provider Notes (Incomplete)
 "  [facility] Provider Note    Event Date/Time   First MD Initiated Contact with Patient 07/24/24 2057     (approximate)   History   Abscess   HPI  Julia Simmons is a 64 y.o. female who presents to the emergency department today because of concerns for urinary tract infection and area of pain to her right buttock.  She states this started about 2 days ago.  In terms of the infection she has noticed an increase urinary frequency, dysuria.  She has history of recurrent UTIs.  She called her doctor who prescribed her ciprofloxacin over the telephone.  She took 1 dose of that.  In addition she has noticed an area of pain and swelling to her right buttock.  She states is quite tender when she tries to sit on it.  She says that she had a fever earlier today.     Physical Exam   Triage Vital Signs: ED Triage Vitals  Encounter Vitals Group     BP 07/24/24 1741 (!) 140/91     Girls Systolic BP Percentile --      Girls Diastolic BP Percentile --      Boys Systolic BP Percentile --      Boys Diastolic BP Percentile --      Pulse Rate 07/24/24 1741 94     Resp 07/24/24 1741 19     Temp 07/24/24 1741 98.2 F (36.8 C)     Temp src --      SpO2 07/24/24 1741 96 %     Weight 07/24/24 1743 (!) 309 lb 15.5 oz (140.6 kg)     Height 07/24/24 1743 5' 5 (1.651 m)     Head Circumference --      Peak Flow --      Pain Score 07/24/24 1742 8     Pain Loc --      Pain Education --      Exclude from Growth Chart --     Most recent vital signs: Vitals:   07/24/24 1741  BP: (!) 140/91  Pulse: 94  Resp: 19  Temp: 98.2 F (36.8 C)  SpO2: 96%    {Only need to document appropriate and relevant physical exam:1} General: Awake, no distress. *** CV:  Good peripheral perfusion. *** Resp:  Normal effort. *** Abd:  No distention. *** Other:  ***   ED Results / Procedures / Treatments   Labs (all labs ordered are listed, but only abnormal results are  displayed) Labs Reviewed  CBC WITH DIFFERENTIAL/PLATELET - Abnormal; Notable for the following components:      Result Value   WBC 14.7 (*)    Neutro Abs 11.8 (*)    Monocytes Absolute 1.3 (*)    All other components within normal limits  COMPREHENSIVE METABOLIC PANEL WITH GFR - Abnormal; Notable for the following components:   Glucose, Bld 100 (*)    BUN 53 (*)    Creatinine, Ser 2.03 (*)    AST 12 (*)    GFR, Estimated 27 (*)    Anion gap 16 (*)    All other components within normal limits  LACTIC ACID, PLASMA  URINALYSIS, ROUTINE W REFLEX MICROSCOPIC     EKG  ***   RADIOLOGY *** {USE THE WORD INTERPRETED!! You MUST document your own interpretation of imaging, as well as the fact that you reviewed the radiologist's report!:1}   PROCEDURES:  Critical Care performed: Yes  CRITICAL CARE Performed by:  Guadalupe Eagles   Total critical care time: *** minutes  Critical care time was exclusive of separately billable procedures and treating other patients.  Critical care was necessary to treat or prevent imminent or life-threatening deterioration.  Critical care was time spent personally by me on the following activities: development of treatment plan with patient and/or surrogate as well as nursing, discussions with consultants, evaluation of patient's response to treatment, examination of patient, obtaining history from patient or surrogate, ordering and performing treatments and interventions, ordering and review of laboratory studies, ordering and review of radiographic studies, pulse oximetry and re-evaluation of patient's condition.   Procedures    MEDICATIONS ORDERED IN ED: Medications - No data to display   IMPRESSION / MDM / ASSESSMENT AND PLAN / ED COURSE  I reviewed the triage vital signs and the nursing notes.                              Differential diagnosis includes, but is not limited to, ***  Patient's presentation is most consistent with {EM  COPA:27473}   ***The patient is on the cardiac monitor to evaluate for evidence of arrhythmia and/or significant heart rate changes.  ***      FINAL CLINICAL IMPRESSION(S) / ED DIAGNOSES   Final diagnoses:  None        Rx / DC Orders   ED Discharge Orders     None        Note:  This document was prepared using Dragon voice recognition software and may include unintentional dictation errors.  "

## 2024-07-24 NOTE — ED Provider Triage Note (Signed)
 Emergency Medicine Provider Triage Evaluation Note  Julia Simmons , a 64 y.o. female  was evaluated in triage.  Pt complains of abscess to right buttock. Symptoms started a few days ago. No known fever. Currently on Cipro for UTI. She believes this is causing nausea. No known fever. KC sent over for sepsis rule out.  Physical Exam  There were no vitals taken for this visit. Gen:   Awake, no distress   Resp:  Normal effort  MSK:   Moves extremities without difficulty  Other:    Medical Decision Making  Medically screening exam initiated at 5:41 PM.  Appropriate orders placed.  Julia Simmons was informed that the remainder of the evaluation will be completed by another provider, this initial triage assessment does not replace that evaluation, and the importance of remaining in the ED until their evaluation is complete.  Does not currently meet sepsis criteria. Basic labs and lactic acid ordered.   Herlinda Kirk NOVAK, FNP 07/24/24 1744

## 2024-07-24 NOTE — ED Triage Notes (Signed)
 First nurse note: pt to ED for urinary sx x2 days. Taking antibiotics. +fever, chills. Hx sepsis.

## 2024-07-24 NOTE — ED Triage Notes (Signed)
 Pt comes in via pov with complaints of nausea, vomiting and chills. Pt states that she thinks she might have another urinary tract infection. Pt denies back pain or urinary issues. Pt has been on cipro since yesterday. Pt complains of what she believes to be a blister on her right buttock for the past couple of days. Pt complains of right buttock pain 8/10 at this time.
